# Patient Record
Sex: Male | Born: 1960
Health system: Southern US, Community
[De-identification: ages and names within clinical notes are randomized; demographics above are authoritative.]

## PROBLEM LIST (undated history)

## (undated) DIAGNOSIS — I509 Heart failure, unspecified: Secondary | ICD-10-CM

## (undated) DIAGNOSIS — I2510 Atherosclerotic heart disease of native coronary artery without angina pectoris: Secondary | ICD-10-CM

## (undated) DIAGNOSIS — I059 Rheumatic mitral valve disease, unspecified: Secondary | ICD-10-CM

## (undated) DIAGNOSIS — D039 Melanoma in situ, unspecified: Secondary | ICD-10-CM

## (undated) DIAGNOSIS — Z95810 Presence of automatic (implantable) cardiac defibrillator: Secondary | ICD-10-CM

## (undated) DIAGNOSIS — E538 Deficiency of other specified B group vitamins: Secondary | ICD-10-CM

## (undated) DIAGNOSIS — G629 Polyneuropathy, unspecified: Secondary | ICD-10-CM

## (undated) DIAGNOSIS — I6523 Occlusion and stenosis of bilateral carotid arteries: Secondary | ICD-10-CM

## (undated) DIAGNOSIS — Z87891 Personal history of nicotine dependence: Secondary | ICD-10-CM

## (undated) DIAGNOSIS — I4891 Unspecified atrial fibrillation: Secondary | ICD-10-CM

## (undated) DIAGNOSIS — I513 Intracardiac thrombosis, not elsewhere classified: Secondary | ICD-10-CM

## (undated) DIAGNOSIS — K219 Gastro-esophageal reflux disease without esophagitis: Secondary | ICD-10-CM

## (undated) DIAGNOSIS — Z951 Presence of aortocoronary bypass graft: Secondary | ICD-10-CM

## (undated) DIAGNOSIS — I236 Thrombosis of atrium, auricular appendage, and ventricle as current complications following acute myocardial infarction: Secondary | ICD-10-CM

## (undated) DIAGNOSIS — I255 Ischemic cardiomyopathy: Secondary | ICD-10-CM

## (undated) DIAGNOSIS — R011 Cardiac murmur, unspecified: Secondary | ICD-10-CM

## (undated) DIAGNOSIS — I5020 Unspecified systolic (congestive) heart failure: Secondary | ICD-10-CM

## (undated) DIAGNOSIS — I219 Acute myocardial infarction, unspecified: Secondary | ICD-10-CM

## (undated) DIAGNOSIS — Z7982 Long term (current) use of aspirin: Secondary | ICD-10-CM

## (undated) DIAGNOSIS — M160 Bilateral primary osteoarthritis of hip: Secondary | ICD-10-CM

## (undated) DIAGNOSIS — Z9889 Other specified postprocedural states: Secondary | ICD-10-CM

## (undated) DIAGNOSIS — E785 Hyperlipidemia, unspecified: Secondary | ICD-10-CM

## (undated) DIAGNOSIS — C801 Malignant (primary) neoplasm, unspecified: Secondary | ICD-10-CM

## (undated) DIAGNOSIS — I10 Essential (primary) hypertension: Secondary | ICD-10-CM

## (undated) DIAGNOSIS — M1990 Unspecified osteoarthritis, unspecified site: Secondary | ICD-10-CM

## (undated) HISTORY — PX: KNEE SURGERY: SHX244

## (undated) HISTORY — DX: Thrombosis of atrium, auricular appendage, and ventricle as current complications following acute myocardial infarction: I23.6

## (undated) HISTORY — DX: Ischemic cardiomyopathy: I25.5

## (undated) HISTORY — PX: SHOULDER ARTHROSCOPY: SHX128

## (undated) HISTORY — PX: CARDIAC VALVE REPLACEMENT: SHX585

## (undated) HISTORY — DX: Rheumatic mitral valve disease, unspecified: I05.9

## (undated) HISTORY — DX: Presence of automatic (implantable) cardiac defibrillator: Z95.810

## (undated) HISTORY — DX: Hyperlipidemia, unspecified: E78.5

## (undated) HISTORY — DX: Atherosclerotic heart disease of native coronary artery without angina pectoris: I25.10

## (undated) HISTORY — DX: Unspecified atrial fibrillation: I48.91

---

## 2008-10-22 HISTORY — PX: MITRAL VALVE REPAIR: SHX2039

## 2008-10-22 HISTORY — PX: CORONARY ARTERY BYPASS GRAFT: SHX141

## 2009-04-28 ENCOUNTER — Emergency Department: Payer: Self-pay | Admitting: Emergency Medicine

## 2009-04-29 ENCOUNTER — Encounter: Payer: Self-pay | Admitting: Cardiology

## 2009-04-29 ENCOUNTER — Ambulatory Visit: Payer: Self-pay | Admitting: Cardiology

## 2009-04-29 ENCOUNTER — Encounter (INDEPENDENT_AMBULATORY_CARE_PROVIDER_SITE_OTHER): Payer: Self-pay | Admitting: *Deleted

## 2009-04-29 DIAGNOSIS — I10 Essential (primary) hypertension: Secondary | ICD-10-CM | POA: Insufficient documentation

## 2009-04-29 DIAGNOSIS — E785 Hyperlipidemia, unspecified: Secondary | ICD-10-CM | POA: Insufficient documentation

## 2009-04-29 DIAGNOSIS — R011 Cardiac murmur, unspecified: Secondary | ICD-10-CM

## 2009-05-02 ENCOUNTER — Ambulatory Visit: Payer: Self-pay | Admitting: Surgery

## 2009-05-02 ENCOUNTER — Ambulatory Visit: Payer: Self-pay | Admitting: Cardiology

## 2009-05-02 ENCOUNTER — Inpatient Hospital Stay (HOSPITAL_COMMUNITY): Admission: AD | Admit: 2009-05-02 | Discharge: 2009-05-09 | Payer: Self-pay | Admitting: Cardiology

## 2009-05-02 ENCOUNTER — Encounter: Payer: Self-pay | Admitting: Cardiology

## 2009-05-02 HISTORY — PX: LEFT HEART CATH AND CORONARY ANGIOGRAPHY: CATH118249

## 2009-05-02 LAB — CONVERTED CEMR LAB
ALT: 18 units/L (ref 0–53)
AST: 17 units/L (ref 0–37)
Alkaline Phosphatase: 62 units/L (ref 39–117)
BUN: 12 mg/dL (ref 6–23)
CO2: 20 meq/L (ref 19–32)
Calcium: 9.7 mg/dL (ref 8.4–10.5)
Chloride: 105 meq/L (ref 96–112)
Creatinine, Ser: 0.88 mg/dL (ref 0.40–1.50)
HCT: 47.2 % (ref 39.0–52.0)
INR: 1 (ref 0.0–1.5)
MCHC: 34.1 g/dL (ref 30.0–36.0)
Platelets: 290 10*3/uL (ref 150–400)
RDW: 13.8 % (ref 11.5–15.5)
Sodium: 140 meq/L (ref 135–145)
Total Bilirubin: 0.4 mg/dL (ref 0.3–1.2)
Total CHOL/HDL Ratio: 6.5
Triglycerides: 302 mg/dL — ABNORMAL HIGH (ref <150)
WBC: 9.5 10*3/uL (ref 4.0–10.5)

## 2009-05-05 ENCOUNTER — Encounter: Payer: Self-pay | Admitting: Cardiology

## 2009-05-05 DIAGNOSIS — Z9889 Other specified postprocedural states: Secondary | ICD-10-CM

## 2009-05-05 DIAGNOSIS — Z951 Presence of aortocoronary bypass graft: Secondary | ICD-10-CM

## 2009-05-05 HISTORY — DX: Presence of aortocoronary bypass graft: Z95.1

## 2009-05-05 HISTORY — DX: Other specified postprocedural states: Z98.890

## 2009-05-05 HISTORY — PX: CORONARY ARTERY BYPASS GRAFT: SHX141

## 2009-05-05 HISTORY — PX: MITRAL VALVE REPAIR: SHX2039

## 2009-05-23 ENCOUNTER — Other Ambulatory Visit: Payer: Self-pay | Admitting: Cardiology

## 2009-05-23 ENCOUNTER — Ambulatory Visit: Payer: Self-pay | Admitting: Cardiology

## 2009-05-23 DIAGNOSIS — I25810 Atherosclerosis of coronary artery bypass graft(s) without angina pectoris: Secondary | ICD-10-CM

## 2009-05-23 DIAGNOSIS — I4891 Unspecified atrial fibrillation: Secondary | ICD-10-CM

## 2009-05-23 DIAGNOSIS — I429 Cardiomyopathy, unspecified: Secondary | ICD-10-CM | POA: Insufficient documentation

## 2009-05-23 DIAGNOSIS — I2589 Other forms of chronic ischemic heart disease: Secondary | ICD-10-CM

## 2009-05-26 ENCOUNTER — Other Ambulatory Visit: Payer: Self-pay | Admitting: Internal Medicine

## 2009-05-26 ENCOUNTER — Ambulatory Visit: Payer: Self-pay | Admitting: Internal Medicine

## 2009-05-26 ENCOUNTER — Encounter (HOSPITAL_COMMUNITY): Admission: RE | Admit: 2009-05-26 | Discharge: 2009-08-24 | Payer: Self-pay | Admitting: Cardiology

## 2009-05-31 ENCOUNTER — Ambulatory Visit: Payer: Self-pay | Admitting: Cardiology

## 2009-06-06 ENCOUNTER — Encounter: Payer: Self-pay | Admitting: *Deleted

## 2009-06-07 ENCOUNTER — Ambulatory Visit: Payer: Self-pay | Admitting: Surgery

## 2009-06-07 ENCOUNTER — Ambulatory Visit: Payer: Self-pay | Admitting: Cardiology

## 2009-06-07 ENCOUNTER — Encounter: Payer: Self-pay | Admitting: Internal Medicine

## 2009-06-07 ENCOUNTER — Encounter: Payer: Self-pay | Admitting: Cardiology

## 2009-06-07 ENCOUNTER — Ambulatory Visit (HOSPITAL_COMMUNITY): Admission: RE | Admit: 2009-06-07 | Discharge: 2009-06-07 | Payer: Self-pay | Admitting: Surgery

## 2009-06-07 LAB — CONVERTED CEMR LAB: POC INR: 3.1

## 2009-06-17 LAB — CONVERTED CEMR LAB
BUN: 20 mg/dL (ref 6–23)
CO2: 20 meq/L (ref 19–32)
Calcium: 9.5 mg/dL (ref 8.4–10.5)
Chloride: 106 meq/L (ref 96–112)
Potassium: 4.7 meq/L (ref 3.5–5.3)

## 2009-06-21 ENCOUNTER — Encounter: Payer: Self-pay | Admitting: Cardiology

## 2009-06-30 ENCOUNTER — Ambulatory Visit: Payer: Self-pay | Admitting: Internal Medicine

## 2009-07-14 ENCOUNTER — Ambulatory Visit: Payer: Self-pay | Admitting: Internal Medicine

## 2009-07-28 ENCOUNTER — Encounter: Payer: Self-pay | Admitting: Cardiology

## 2009-07-28 ENCOUNTER — Ambulatory Visit: Payer: Self-pay

## 2009-08-01 ENCOUNTER — Ambulatory Visit: Payer: Self-pay | Admitting: Cardiology

## 2009-08-01 ENCOUNTER — Telehealth: Payer: Self-pay | Admitting: Cardiology

## 2009-08-08 ENCOUNTER — Telehealth: Payer: Self-pay | Admitting: Cardiology

## 2009-08-10 ENCOUNTER — Telehealth: Payer: Self-pay | Admitting: Cardiology

## 2009-08-15 ENCOUNTER — Ambulatory Visit: Payer: Self-pay | Admitting: Internal Medicine

## 2009-08-16 ENCOUNTER — Ambulatory Visit: Payer: Self-pay | Admitting: Cardiology

## 2009-08-17 ENCOUNTER — Encounter: Payer: Self-pay | Admitting: Cardiology

## 2009-08-18 LAB — CONVERTED CEMR LAB
ALT: 31 units/L (ref 0–53)
AST: 17 units/L (ref 0–37)
Alkaline Phosphatase: 72 units/L (ref 39–117)
Cholesterol: 150 mg/dL (ref 0–200)
Glucose, Bld: 105 mg/dL — ABNORMAL HIGH (ref 70–99)
HDL: 40 mg/dL (ref >39)
LDL Cholesterol: 67 mg/dL (ref 0–99)
Potassium: 4.4 meq/L (ref 3.5–5.3)
Total CHOL/HDL Ratio: 3.8
Total Protein: 7 g/dL (ref 6.0–8.3)
Triglycerides: 217 mg/dL — ABNORMAL HIGH (ref <150)
VLDL: 43 mg/dL — ABNORMAL HIGH (ref 0–40)

## 2009-08-23 ENCOUNTER — Ambulatory Visit: Payer: Self-pay | Admitting: Cardiology

## 2009-08-24 ENCOUNTER — Telehealth: Payer: Self-pay | Admitting: Cardiology

## 2009-08-25 ENCOUNTER — Inpatient Hospital Stay (HOSPITAL_COMMUNITY): Admission: AD | Admit: 2009-08-25 | Discharge: 2009-08-26 | Payer: Self-pay | Admitting: Internal Medicine

## 2009-08-25 ENCOUNTER — Ambulatory Visit: Payer: Self-pay | Admitting: Internal Medicine

## 2009-08-25 DIAGNOSIS — Z95810 Presence of automatic (implantable) cardiac defibrillator: Secondary | ICD-10-CM

## 2009-08-25 HISTORY — DX: Presence of automatic (implantable) cardiac defibrillator: Z95.810

## 2009-08-25 HISTORY — PX: CARDIAC DEFIBRILLATOR PLACEMENT: SHX171

## 2009-08-25 LAB — CONVERTED CEMR LAB
CO2: 19 meq/L (ref 19–32)
Calcium: 9.6 mg/dL (ref 8.4–10.5)
Chloride: 106 meq/L (ref 96–112)
Glucose, Bld: 84 mg/dL (ref 70–99)
HCT: 45.2 % (ref 39.0–52.0)
Hemoglobin: 15.1 g/dL (ref 13.0–17.0)
MCV: 88.6 fL (ref 78.0–100.0)
Platelets: 254 10*3/uL (ref 150–400)
Prothrombin Time: 16.7 s — ABNORMAL HIGH (ref 11.6–15.2)
Sodium: 138 meq/L (ref 135–145)

## 2009-08-26 ENCOUNTER — Encounter: Payer: Self-pay | Admitting: Internal Medicine

## 2009-09-01 ENCOUNTER — Encounter (INDEPENDENT_AMBULATORY_CARE_PROVIDER_SITE_OTHER): Payer: Self-pay | Admitting: *Deleted

## 2009-09-07 ENCOUNTER — Ambulatory Visit: Payer: Self-pay | Admitting: Internal Medicine

## 2009-09-07 ENCOUNTER — Ambulatory Visit: Payer: Self-pay

## 2009-09-13 ENCOUNTER — Telehealth: Payer: Self-pay | Admitting: Cardiology

## 2009-09-30 ENCOUNTER — Ambulatory Visit: Payer: Self-pay | Admitting: Cardiology

## 2009-10-12 ENCOUNTER — Ambulatory Visit: Payer: Self-pay | Admitting: Internal Medicine

## 2009-10-12 ENCOUNTER — Encounter: Payer: Self-pay | Admitting: Cardiology

## 2009-10-17 LAB — CONVERTED CEMR LAB: Glucose, Bld: 87 mg/dL (ref 70–99)

## 2009-10-24 ENCOUNTER — Ambulatory Visit: Payer: Self-pay | Admitting: Cardiovascular Disease

## 2009-11-17 ENCOUNTER — Telehealth: Payer: Self-pay | Admitting: Cardiology

## 2009-11-21 ENCOUNTER — Ambulatory Visit: Payer: Self-pay | Admitting: Internal Medicine

## 2009-11-30 ENCOUNTER — Ambulatory Visit: Payer: Self-pay | Admitting: Cardiology

## 2009-12-29 ENCOUNTER — Ambulatory Visit: Payer: Self-pay | Admitting: Cardiovascular Disease

## 2010-01-31 ENCOUNTER — Ambulatory Visit: Payer: Self-pay | Admitting: Internal Medicine

## 2010-02-09 ENCOUNTER — Ambulatory Visit: Payer: Self-pay | Admitting: Cardiology

## 2010-02-13 ENCOUNTER — Encounter: Payer: Self-pay | Admitting: Internal Medicine

## 2010-02-13 ENCOUNTER — Ambulatory Visit: Payer: Self-pay | Admitting: Internal Medicine

## 2010-02-13 LAB — CONVERTED CEMR LAB: POC INR: 2.5

## 2010-02-16 LAB — CONVERTED CEMR LAB
CO2: 20 meq/L (ref 19–32)
Cholesterol: 143 mg/dL (ref 0–200)
Creatinine, Ser: 0.87 mg/dL (ref 0.40–1.50)
Glucose, Bld: 93 mg/dL (ref 70–99)
LDL Cholesterol: 71 mg/dL (ref 0–99)
Potassium: 4.5 meq/L (ref 3.5–5.3)
Sodium: 141 meq/L (ref 135–145)
Total CHOL/HDL Ratio: 4
Triglycerides: 181 mg/dL — ABNORMAL HIGH (ref <150)

## 2010-02-24 ENCOUNTER — Telehealth: Payer: Self-pay | Admitting: Cardiology

## 2010-02-28 ENCOUNTER — Ambulatory Visit: Payer: Self-pay | Admitting: Cardiovascular Disease

## 2010-03-21 ENCOUNTER — Ambulatory Visit: Payer: Self-pay | Admitting: Cardiovascular Disease

## 2010-03-21 LAB — CONVERTED CEMR LAB: POC INR: 3.1

## 2010-03-22 ENCOUNTER — Telehealth: Payer: Self-pay | Admitting: Cardiovascular Disease

## 2010-03-28 ENCOUNTER — Ambulatory Visit: Payer: Self-pay

## 2010-03-28 ENCOUNTER — Ambulatory Visit: Payer: Self-pay | Admitting: Surgery

## 2010-03-28 ENCOUNTER — Encounter: Payer: Self-pay | Admitting: Cardiology

## 2010-03-30 ENCOUNTER — Telehealth: Payer: Self-pay | Admitting: Cardiology

## 2010-04-14 ENCOUNTER — Ambulatory Visit: Payer: Self-pay | Admitting: Orthopedic Surgery

## 2010-04-17 ENCOUNTER — Telehealth: Payer: Self-pay | Admitting: Cardiology

## 2010-04-17 ENCOUNTER — Encounter: Admission: RE | Admit: 2010-04-17 | Discharge: 2010-04-17 | Payer: Self-pay | Admitting: Orthopedic Surgery

## 2010-04-18 ENCOUNTER — Ambulatory Visit: Payer: Self-pay | Admitting: Cardiology

## 2010-04-18 LAB — CONVERTED CEMR LAB: POC INR: 1.9

## 2010-04-19 ENCOUNTER — Encounter: Payer: Self-pay | Admitting: Cardiovascular Disease

## 2010-04-20 ENCOUNTER — Ambulatory Visit: Payer: Self-pay | Admitting: Orthopedic Surgery

## 2010-05-24 ENCOUNTER — Ambulatory Visit: Payer: Self-pay | Admitting: Cardiovascular Disease

## 2010-05-24 LAB — CONVERTED CEMR LAB: POC INR: 2.2

## 2010-06-21 ENCOUNTER — Ambulatory Visit: Payer: Self-pay | Admitting: Cardiology

## 2010-06-21 LAB — CONVERTED CEMR LAB: POC INR: 3.1

## 2010-07-07 ENCOUNTER — Ambulatory Visit: Payer: Self-pay | Admitting: Internal Medicine

## 2010-07-07 ENCOUNTER — Encounter: Payer: Self-pay | Admitting: Internal Medicine

## 2010-07-19 ENCOUNTER — Ambulatory Visit: Payer: Self-pay | Admitting: Cardiology

## 2010-07-19 LAB — CONVERTED CEMR LAB: POC INR: 2.8

## 2010-08-16 ENCOUNTER — Ambulatory Visit: Payer: Self-pay | Admitting: Cardiovascular Disease

## 2010-08-16 LAB — CONVERTED CEMR LAB: POC INR: 2.8

## 2010-08-23 ENCOUNTER — Telehealth: Payer: Self-pay | Admitting: Cardiology

## 2010-08-29 ENCOUNTER — Ambulatory Visit: Payer: Self-pay | Admitting: Cardiology

## 2010-09-27 ENCOUNTER — Ambulatory Visit: Payer: Self-pay | Admitting: Cardiovascular Disease

## 2010-09-29 ENCOUNTER — Ambulatory Visit: Payer: Self-pay

## 2010-10-06 ENCOUNTER — Telehealth (INDEPENDENT_AMBULATORY_CARE_PROVIDER_SITE_OTHER): Payer: Self-pay | Admitting: *Deleted

## 2010-10-11 ENCOUNTER — Ambulatory Visit: Payer: Self-pay | Admitting: Cardiovascular Disease

## 2010-10-11 LAB — CONVERTED CEMR LAB: POC INR: 2.5

## 2010-10-25 ENCOUNTER — Ambulatory Visit
Admission: RE | Admit: 2010-10-25 | Discharge: 2010-10-25 | Payer: Self-pay | Source: Home / Self Care | Attending: Internal Medicine | Admitting: Internal Medicine

## 2010-11-13 ENCOUNTER — Ambulatory Visit: Admission: RE | Admit: 2010-11-13 | Discharge: 2010-11-13 | Payer: Self-pay | Source: Home / Self Care

## 2010-11-13 ENCOUNTER — Encounter: Payer: Self-pay | Admitting: Surgery

## 2010-11-19 LAB — CONVERTED CEMR LAB
AST: 22 units/L (ref 0–37)
Bilirubin, Direct: 0.1 mg/dL (ref 0.0–0.3)
Cholesterol: 159 mg/dL (ref 0–200)
HDL: 34 mg/dL — ABNORMAL LOW (ref >39)
Indirect Bilirubin: 0.5 mg/dL (ref 0.0–0.9)
LDL Cholesterol: 83 mg/dL (ref 0–99)
Total Bilirubin: 0.6 mg/dL (ref 0.3–1.2)
Total CHOL/HDL Ratio: 4.7
Total Protein: 7.5 g/dL (ref 6.0–8.3)
VLDL: 42 mg/dL — ABNORMAL HIGH (ref 0–40)

## 2010-11-21 NOTE — Medication Information (Signed)
Summary: CCR/AMD  Anticoagulant Therapy  Managed by: Cloyde Reams, RN, BSN Referring MD: Dr Shirlee Latch PCP: [name], MD Supervising MD: Mariah Milling Indication 1: Mitral Valve Disorder (ICD-424.0) Lab Used: [facility]  Site:  INR POC 1.9 INR RANGE 2.0-3.0  Dietary changes: no    Health status changes: no    Bleeding/hemorrhagic complications: no    Recent/future hospitalizations: yes       Details: Shoulder arthogram scheduled for Thurs 04/20/10  Any changes in medication regimen? no    Recent/future dental: no  Any missed doses?: no       Is patient compliant with meds? yes       Allergies: No Known Drug Allergies  Anticoagulation Management History:      The patient is taking warfarin and comes in today for a routine follow up visit.  Negative risk factors for bleeding include an age less than 69 years old.  The bleeding index is 'low risk'.  Positive CHADS2 values include History of HTN.  Negative CHADS2 values include Age > 37 years old.  The start date was 05/05/2009.  His last INR was 1.36.  Anticoagulation responsible provider: [facility].  INR POC: 1.9.  Cuvette Lot#: [id number].  Exp: 06/2011.    Anticoagulation Management Assessment/Plan:      The patient's current anticoagulation dose is Warfarin sodium 5 mg tabs: Use as directed by Anticoagulation Clinic.  The target INR is 2.5 - 3.  The next INR is due 05/16/2010.  Anticoagulation instructions were given to patient.  Results were reviewed/authorized by Cloyde Reams, RN, BSN.  He was notified by Cloyde Reams RN.         Prior Anticoagulation Instructions: INR 3.1  Start taking 1/2 tablet daily except 1 tablets on Mondays, Wednesdays, and Fridays.  Recheck in 4 weeks.    Current Anticoagulation Instructions: INR 1.9  Take an extra 1/2 tablet today, then resume same dosage 1/2 tablet daily except 1 tablet Mondays, Wednesdays, and Fridays.  Recheck in 4 weeks.

## 2010-11-21 NOTE — Assessment & Plan Note (Signed)
Summary: F4M/AMD   Visit Type:  Follow-up Referring Provider:  Marca Ancona, MD Primary Provider:  [name], MD  CC:  Patient has no complaints. Folow up only.  History of Present Illness: 50 yo with h/o CAD s/p CABG and ischemic cardiomyopathy as well as LV apical thrombus returns for followup.  He is doing quite well with minimal symptoms.  No chest pain.  He completed cardiac rehab and has been trying to keep up with the exercise regimen that he learned.  No exertional dyspnea.  He push-mowed his lawn this week with no problems.  [place]  He is tolerating all his meds.  Weight is stable.   ECG: NSR, old inferior and anterior MIs  Labs (12/10): creatinine 0.85  Current Medications (verified): 1)  Lipitor 80 Mg Tabs (Atorvastatin Calcium) .... Take One Tablet By Mouth Daily. 2)  Nitroglycerin 0.4 Mg Subl (Nitroglycerin) .... One Tablet Under Tongue Every 5 Minutes As Needed For Chest Pain---May Repeat Times Three 3)  Warfarin Sodium 5 Mg Tabs (Warfarin Sodium) .... Use As Directed By Anticoagulation Clinic 4)  Carvedilol 12.5 Mg Tabs (Carvedilol) .... Take One Tablet By Mouth Twice A Day 5)  Enalapril Maleate 10 Mg Tabs (Enalapril Maleate) .... Take One Tablet By Mouth Twice A Day 6)  Spironolactone 25 Mg Tabs (Spironolactone) .... Take 1/2 Tab By Mouth Daily 7)  Aspirin 81 Mg Tbec (Aspirin) .... Take One Tablet By Mouth Daily  Allergies (verified): No Known Drug Allergies  Past History:  Past Medical History: Reviewed history from 09/30/2009 and no changes required. 1.  Hyperlipidemia 2.  Smoking: quit 7/10 3.  CAD:  LHC (7/10) with EF 35%, RCA totally occluded, ostial LAD totally occluded, mid CFX totally occluded.  Large OM1 with collaterals to the LAD and RCA.  CABG with LIMA-LAD, RIMA-RCA.   4.  Moderate MR: MV repair/annuloplasty ring at time of CABG (7/10). 5.  Ischemic CMP: echo (7/10) with mild LV dilatation, EF  25-30%.  Anteroseptal, inferoseptal, inferior, and apical akinesis.  RV normal.  Moderate MR.  Repeat echo (10/10) EF 20%, mild to moderately dilated, dyskinetic apex, akinetic septum, severe hypokinesis of the inferior and anterior walls. Mild MR.  LV apical thrombus.  6.  Atrial fibrillation: post-op CABG.  7.  LV apical thrombus: on coumadin.  8.  [employer] dual chamber AICD (11/10)  Family History: Reviewed history from 04/29/2009 and no changes required. Father with MI at 25 Mother with CHF Hyperlipidemia runs in family  Social History: Lives with wife in [place].  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  Originally from [place].  Smoked 1 ppd x years, quit 7/10.  Occasional ETOH.  No drugs.   Review of Systems       All systems reviewed and negative except per HPI.   Vital Signs:  Patient profile:   50 year old male Height:      69 inches Weight:      206.50 pounds BMI:     30.60 Pulse rate:   69 / minute BP sitting:   136 / 84  (left arm) Cuff size:   large  Physical Exam  General:  Well developed, well nourished, in no acute distress. Neck:  Neck supple, no JVD. No masses, thyromegaly or abnormal cervical nodes. Lungs:  Clear bilaterally to auscultation and percussion. Heart:  Non-displaced PMI, chest non-tender; regular rate and rhythm, S1,  S2 without rubs or gallops.  No murmur.  Carotid upstroke normal, no bruit. Pedals normal pulses. No edema, no varicosities. Abdomen:  Bowel sounds positive; abdomen soft and non-tender without masses, organomegaly, or hernias noted. No hepatosplenomegaly. Extremities:  No clubbing or cyanosis. Neurologic:  Alert and oriented x 3. Psych:  Normal affect.    ICD Specifications Following MD:  [name] Manges, MD     ICD Vendor:  [facility] [employer]     ICD Model Number:  E110     ICD Serial Number:  [id number] ICD DOI:  08/25/2009     ICD Implanting MD:  [name] Manges, MD  Lead 1:    Location:  RA     DOI: 08/25/2009     Model #: [id number]     Serial #: [id number]     Status: active Lead 2:    Location: RV     DOI: 08/25/2009     Model #: [id number]     Serial #: [id number]     Status: active  Episodes Coumadin:  Yes  Brady Parameters Mode DDD     Lower Rate Limit:  40     Upper Rate Limit 130 PAV 220     Sensed AV Delay:  300  Tachy Zones VF:  200     VT:  170     Impression & Recommendations:  Problem # 1:  CARDIOMYOPATHY, ISCHEMIC (ICD-414.8) NYHA class I symptoms at this time.  He is doing very well and is active.  I will titrate up his Coreg to 18.75 mg two times a day today and spironolactone to 25 mg daily today as well.  BMET in 1 week. Continue current dose of enalapril.   Problem # 2:  CAD, AUTOLOGOUS BYPASS GRAFT (ICD-414.02) No ischemia symptoms.  Will draw lipids/LFTs when he returns for BMET in 1 week.  Goal LDL < 70.   Problem # 3:  IMPLANTABLE DEFIBRILLATOR BS MADIR-RIT (ICD-V45.02) Has [employer] ICD, followup with Dr. Graciela Husbands.

## 2010-11-21 NOTE — Progress Notes (Signed)
Summary: surgical clearance  Phone Note Call from Patient   Summary of Call: pt. to have shoulder surgery on Thursday and they have not received a clearance in same day surg at [facility].  It has been faxed before to the [facility] office.  Please fax to them the clearance. Initial call taken by: Park Breed,  April 17, 2010 3:52 PM  Follow-up for Phone Call        Talked to Dr. Shirlee Latch ok for surgery clearance faxed to Dr. Rosita Kea. Benedict Needy, RN  April 18, 2010 9:46 AM

## 2010-11-21 NOTE — Assessment & Plan Note (Signed)
Summary: rov   Visit Type:  Follow-up Referring Provider:  Marca Ancona, MD Primary Provider:  [name], MD  CC:  "Doing well"..  History of Present Illness: 50 yo with h/o CAD s/p CABG and ischemic cardiomyopathy as well as LV apical thrombus returns for followup.  He is doing quite well with minimal symptoms.  No chest pain.  He completed cardiac rehab and has been trying to keep up with the exercise regimen that he learned.  No exertional dyspnea.   He is tolerating all his meds.  Weight is down 2 lbs.  [place] found property.  Echo was reviewed from 6/11, showing EF 40% (improved) with dyskinetic apex.    ECG: NSR, old inferior and anterior MIs with inferior and anterolateral T wave inversions  Labs (12/10): creatinine 0.85 Labs (4/11): K 4.5, creatinine 0.87, LDL 71, HDL 36  Current Medications (verified): 1)  Lipitor 80 Mg Tabs (Atorvastatin Calcium) .... Take One Tablet By Mouth Daily. 2)  Nitroglycerin 0.4 Mg Subl (Nitroglycerin) .... One Tablet Under Tongue Every 5 Minutes As Needed For Chest Pain---May Repeat Times Three 3)  Warfarin Sodium 5 Mg Tabs (Warfarin Sodium) .... Use As Directed By Anticoagulation Clinic 4)  Carvedilol 12.5 Mg Tabs (Carvedilol) .Marland Kitchen.. 1 1/2 By Mouth Two Times A Day 5)  Enalapril Maleate 10 Mg Tabs (Enalapril Maleate) .... Take One Tablet By Mouth Twice A Day 6)  Spironolactone 25 Mg Tabs (Spironolactone) .... One By Mouth Daily 7)  Aspirin 81 Mg Tbec (Aspirin) .... Take One Tablet By Mouth Daily 8)  Fish Oil 1200 Mg Caps (Omega-3 Fatty Acids) .Marland Kitchen.. 1 Tab Daily  Allergies (verified): No Known Drug Allergies  Past History:  Past Surgical History: Last updated: 08/15/2009 Knee surgery x 4 CAABG  Family History: Last updated: 04/29/2009 Father with MI at 58 Mother with CHF Hyperlipidemia runs in family  Social History: Last updated: 02/09/2010 Lives with wife in [place].  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  Originally from [place].  Smoked 1 ppd x years, quit 7/10.  Occasional ETOH.  No drugs.   Past Medical History: 1.  Hyperlipidemia 2.  Smoking: quit 7/10 3.  CAD:  LHC (7/10) with EF 35%, RCA totally occluded, ostial LAD totally occluded, mid CFX totally occluded.  Large OM1 with collaterals to the LAD and RCA.  CABG with LIMA-LAD, RIMA-RCA.   4.  Moderate MR: MV repair/annuloplasty ring at time of CABG (7/10). 5.  Ischemic CMP: echo (7/10) with mild LV dilatation, EF 25-30%.  Anteroseptal, inferoseptal, inferior, and apical akinesis.  RV normal.  Moderate MR.  Repeat echo (10/10) EF 20%, mild to moderately dilated, dyskinetic apex, akinetic septum, severe hypokinesis of the inferior and anterior walls. Mild MR.  LV apical thrombus.  Repeat echo (6/11): EF 40%, moderately dilated LV, mild LVH, apical anterior wall/apical septal wall/apex dyskinetic, s/p MV repair with no MS or MR. LV apical thrombus not seen on this echo.  6.  Atrial fibrillation: post-op CABG.  7.  LV apical thrombus: on coumadin.  8.  [employer] dual chamber AICD (11/10)  Family History: Reviewed history from 04/29/2009 and no changes required. Father with MI at 73 Mother with CHF Hyperlipidemia runs in family  Social History: Reviewed history from 02/09/2010 and no changes required. Lives with wife in [place].  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  Originally from [place].  Smoked 1 ppd x years, quit 7/10.  Occasional ETOH.  No drugs.   Review of Systems       All systems reviewed and negative except as per HPI.    Vital Signs:  Patient profile:   50 year old male Height:      69 inches Weight:      204 pounds BMI:     30.23 Pulse rate:   74 / minute BP sitting:   110 / 70  (left arm) Cuff size:   regular  Vitals Entered By: Bishop Dublin, CMA (August 29, 2010 11:28 AM)  Physical  Exam  General:  Well developed, well nourished, in no acute distress. Neck:  Neck supple, no JVD. No masses, thyromegaly or abnormal cervical nodes. Lungs:  Clear bilaterally to auscultation and percussion. Heart:  Non-displaced PMI, chest non-tender; regular rate and rhythm, S1, S2 without rubs or gallops.  No murmur.  Carotid upstroke normal, no bruit. Pedals normal pulses. No edema, no varicosities. Abdomen:  Bowel sounds positive; abdomen soft and non-tender without masses, organomegaly, or hernias noted. No hepatosplenomegaly. Extremities:  No clubbing or cyanosis. Neurologic:  Alert and oriented x 3. Psych:  Normal affect.    ICD Specifications Following MD:  Sherryl Manges, MD     ICD Vendor:  [facility] [employer]     ICD Model Number:  E110     ICD Serial Number:  [id number] ICD DOI:  08/25/2009     ICD Implanting MD:  Sherryl Manges, MD Research Study Name MADIT RIT  Lead 1:    Location: RA     DOI: 08/25/2009     Model #: [id number]     Serial #: [id number]     Status: active Lead 2:    Location: RV     DOI: 08/25/2009     Model #: [id number]     Serial #: [id number]     Status: active  ICD Follow Up ICD Dependent:  No      Episodes Coumadin:  Yes  Brady Parameters Mode DDD     Lower Rate Limit:  40     Upper Rate Limit 130 PAV 220     Sensed AV Delay:  300  Tachy Zones VF:  200     VT:  170     Impression & Recommendations:  Problem # 1:  CAD, AUTOLOGOUS BYPASS GRAFT (ICD-414.02) No ischemia symptoms.  Will draw lipids/LFTs,  goal LDL < 70.  Continue ASA, statin, ACEI, beta blocker.   Problem # 2:  CARDIOMYOPATHY, ISCHEMIC (ICD-414.8) NYHA class I symptoms at this time.  He is doing very well and is active.  I will titrate up his Coreg to 25 mg two times a day today. Continue current doses of enalapril and spironolactone.  EF improved to 40% on last echo.   Problem # 3:  IMPLANTABLE DEFIBRILLATOR BS MADIR-RIT (ICD-V45.02) Has [employer] ICD, followup with Dr. Graciela Husbands.   Problem # 4:   APICAL THROMBUS Not seen on last echo.  Continue coumadin for now.   Other Orders: EKG w/ Interpretation (93000) T-Hepatic Function [phone]) T-Lipid Profile ([id number])  Patient Instructions: 1)  Your physician recommends that you schedule a follow-up appointment in: 6 months 2)  Your physician has recommended you make the following change in your medication: Increase Coreg to 25mg  two times a day. 3)  Your physician recommends that you have labwork today Lipid/Liver. Prescriptions: CARVEDILOL 25 MG TABS (CARVEDILOL)  Take one tablet by mouth twice a day  #60 x 6   Entered by:   Cloyde Reams RN   Authorized by:   Marca Ancona, MD   Signed by:   Cloyde Reams RN on 08/29/2010   Method used:   Electronically to        [facility] [phone]* (retail)       [address]       [place], [place]  [address]       Ph: [phone]       Fax: [phone]   RxID:   [id number] SPIRONOLACTONE 25 MG TABS (SPIRONOLACTONE) Take 1 tablet by mouth two times a day  #60 x 2   Entered by:   Cloyde Reams RN   Authorized by:   Marca Ancona, MD   Signed by:   Cloyde Reams RN on 08/29/2010   Method used:   Electronically to        [facility] [phone]* (retail)       [address]       [place], [place]  [address]       Ph: [phone]       Fax: [phone]   RxID:   [id number] ENALAPRIL MALEATE 20 MG TABS (ENALAPRIL MALEATE) Take 1 tablet by mouth two times a day  #60 x 2   Entered by:   Cloyde Reams RN   Authorized by:   Marca Ancona, MD   Signed by:   Cloyde Reams RN on 08/29/2010   Method used:   Electronically to        [facility] [phone]* (retail)       [address]       [place], [place]  [address]       Ph: [phone]       Fax: [phone]   RxID:   [id number]

## 2010-11-21 NOTE — Progress Notes (Signed)
Summary: PROCEDURE  Phone Note From Other Clinic Call back at [phone] EXT [phone]   Caller: SHEILA FROM DR [facility]  Call For: [facility] Summary of Call: DOES PT NEED TO STOP COUMADIN FOR 5 DAYS BEFORE AN ARTHROGRAM OF THE SHOULDER?  IF SO, IS IT OKAY FOR HIM TO STOP? #[phone] EXT [phone] SHEILA AT [facility] Initial call taken by: Harlon Flor,  March 30, 2010 11:38 AM  Follow-up for Phone Call        Jackelyn Knife at Dr. Neomia Glass office wants pt to stop coumadin for 5 days please advise if this is okay. Thanks Benedict Needy, RN  March 30, 2010 2:05 PM      Appended Document: PROCEDURE Will need to ask orthopedic surgeon if he needs to stop coumadin for the arthrogram.  I suspect that he will.  If he needs to stop coumadin, he will need a repeat limited echo with Definity contrast to make sure that no apical thrombus is present.  If no thrombus noted, ok to stop coumadin for procedure 5 days before and start back afterwards.   Appended Document: PROCEDURE Called spoke with Silvio Pate at Dr Rosita Kea office, advised of need to have 2D ECHO with Definity contrast prior to stopping coumadin to evaluate for apical thrombus.  Silvio Pate is going to check with Dr Rosita Kea and she will call us back if wishes to proceed and we will schedule the ECHO. EWJ

## 2010-11-21 NOTE — Progress Notes (Signed)
Summary: Refill  Phone Note Refill Request Message from:  Patient on November 17, 2009 10:13 AM  Refills Requested: Medication #1:  WARFARIN SODIUM 5 MG TABS Use as directed by Anticoagulation Clinic [facility]   Initial call taken by: [place] Car[place],  November 17, 2009 10:13 AM    Prescriptions: WARFARIN SODIUM 5 MG TABS (WARFARIN SODIUM) Use as directed by Anticoagulation Clinic  #60 x 2   Entered by:   Charlena Cross, RN, BSN   Authorized by:   Marca Ancona, MD   Signed by:   Charlena Cross, RN, BSN on 11/17/2009   Method used:   Electronically to        [facility] [phone]* (retail)       [address]       [place], [place]  [address]       Ph: [phone]       Fax: [phone]   RxID:   [id number]

## 2010-11-21 NOTE — Progress Notes (Signed)
Summary: disability form  Phone Note Outgoing Call   Call placed by: Katina Dung, RN, BSN,  August 23, 2010 12:50 PM Call placed to: Patient Summary of Call: disability papers  Follow-up for Phone Call        Dr Shirlee Latch received long term disability form  from [employer] for Dr Shirlee Latch to complete.--I talked with pt about the status of his disability. I  made pt an appointment to see Dr Shirlee Latch 08/29/10 in [place] to evaluate his medical condition and discuss the status of his disability. --I have sent the incomplete  disablity form  by courier to the [facility]  in a red folder.

## 2010-11-21 NOTE — Assessment & Plan Note (Signed)
Summary: ROV/CCR/AMD   Visit Type:  Follow-up Referring Provider:  Marca Ancona, MD Primary Provider:  [name], MD  CC:  no complaints.  History of Present Illness:    [name] [name] is seen in followup for an ICD implanted for primary prevention in the setting of ischemic heart disease prior bypass surgery and ejection fraction of 20-25%. He also has an apical thrombus for which he takes Coumadin.  He is enrolled in the MADIT-R IT trial. He's had no problems with his defibrillator since implantation. The patient denies SOB, chest pain, edema or palpitations   Current Problems (verified): 1)  Atrial Fibrillation  (ICD-427.31) 2)  Cad, Autologous Bypass Graft  (ICD-414.02) 3)  Cardiomyopathy, Ischemic  (ICD-414.8) 4)  Hypertension, Unspecified  (ICD-401.9) 5)  Murmur  (ICD-785.2) 6)  Hyperlipidemia-mixed  (ICD-272.4) 7)  Chest Pain-precordial  (NUU-725.36)  Current Medications (verified): 1)  Lipitor 80 Mg Tabs (Atorvastatin Calcium) .... Take One Tablet By Mouth Daily. 2)  Nitroglycerin 0.4 Mg Subl (Nitroglycerin) .... One Tablet Under Tongue Every 5 Minutes As Needed For Chest Pain---May Repeat Times Three 3)  Warfarin Sodium 5 Mg Tabs (Warfarin Sodium) .... Use As Directed By Anticoagulation Clinic 4)  Carvedilol 12.5 Mg Tabs (Carvedilol) .... Take One Tablet By Mouth Twice A Day 5)  Enalapril Maleate 10 Mg Tabs (Enalapril Maleate) .... Take One Tablet By Mouth Twice A Day 6)  Spironolactone 25 Mg Tabs (Spironolactone) .... Take 1/2 Tab By Mouth Daily 7)  Aspirin 81 Mg Tbec (Aspirin) .... Take One Tablet By Mouth Daily  Allergies (verified): No Known Drug Allergies  Past History:  Past Medical History: Last updated: 09/30/2009 1.  Hyperlipidemia 2.  Smoking: quit 7/10 3.  CAD:  LHC (7/10) with EF 35%, RCA totally occluded, ostial LAD totally occluded, mid CFX totally occluded.  Large OM1 with collaterals to the LAD and RCA.  CABG with LIMA-LAD, RIMA-RCA.   4.   Moderate MR: MV repair/annuloplasty ring at time of CABG (7/10). 5.  Ischemic CMP: echo (7/10) with mild LV dilatation, EF 25-30%.  Anteroseptal, inferoseptal, inferior, and apical akinesis.  RV normal.  Moderate MR.  Repeat echo (10/10) EF 20%, mild to moderately dilated, dyskinetic apex, akinetic septum, severe hypokinesis of the inferior and anterior walls. Mild MR.  LV apical thrombus.  6.  Atrial fibrillation: post-op CABG.  7.  LV apical thrombus: on coumadin.  8.  [employer] dual chamber AICD (11/10)  Past Surgical History: Last updated: 08/15/2009 Knee surgery x 4 CAABG  Family History: Last updated: 04/29/2009 Father with MI at 16 Mother with CHF Hyperlipidemia runs in family  Social History: Last updated: 05/23/2009 Lives with wife in [place].  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  Smoked 1 ppd x years, quit 7/10.  Occasional ETOH.  No drugs.   Vital Signs:  Patient profile:   50 year old male Height:      69 inches Weight:      202.75 pounds BMI:     30.05 Pulse rate:   76 / minute Pulse rhythm:   regular BP sitting:   120 / 72  (right arm) Cuff size:   regular  Vitals Entered By: Mercer Pod (November 21, 2009 10:57 AM)  Physical Exam  General:  The patient was alert and oriented in no acute distress. HEENT Normal.  Neck veins were flat, carotids were brisk.  Lungs were clear.  Heart sounds were regular without murmurs or gallops.  Abdomen was soft with  active bowel sounds. There is no clubbing cyanosis or edema. Skin Warm and dry Device pocket is well-healed    ICD Specifications Following MD:  Sherryl Manges, MD     ICD Vendor:  [facility] [employer]     ICD Model Number:  E110     ICD Serial Number:  [id number] ICD DOI:  08/25/2009     ICD Implanting MD:  Sherryl Manges, MD  Lead 1:    Location: RA     DOI: 08/25/2009     Model #: [id number]     Serial #: [id number]     Status: active Lead 2:    Location: RV     DOI: 08/25/2009     Model #:  [id number]     Serial #: [id number]     Status: active  ICD Follow Up Remote Check?  No Charge Time:  8.8 seconds     Battery Est. Longevity:  8.5 years   ICD Device Measurements Atrium:  Amplitude: 6.9 mV, Impedance: 499 ohms, Threshold: 0.6 V at 0.4 msec Right Ventricle:  Amplitude: 25 mV, Impedance: 530 ohms, Threshold: 0.4 V at 0.4 msec Shock Impedance: 56 ohms   Episodes MS Episodes:  0     Percent Mode Switch:  0     Coumadin:  Yes Shock:  0     ATP:  0      Brady Parameters Mode DDD     Lower Rate Limit:  40     Upper Rate Limit 130 PAV 220     Sensed AV Delay:  300  Tachy Zones VF:  200     VT:  170     Tech Comments:  Checked by Media planner for research. Altha Harm, LPN  November 21, 2009 11:35 AM   Impression & Recommendations:  Problem # 1:  ATRIAL FIBRILLATION (ICD-427.31) no evidence of atrial fibrillation His updated medication list for this problem includes:    Warfarin Sodium 5 Mg Tabs (Warfarin sodium) ..... Use as directed by anticoagulation clinic    Carvedilol 12.5 Mg Tabs (Carvedilol) .Marland Kitchen... Take one tablet by mouth twice a day    Aspirin 81 Mg Tbec (Aspirin) .Marland Kitchen... Take one tablet by mouth daily  Problem # 2:  CARDIOMYOPATHY, ISCHEMIC (ICD-414.8) stable on current medication His updated medication list for this problem includes:    Nitroglycerin 0.4 Mg Subl (Nitroglycerin) ..... One tablet under tongue every 5 minutes as needed for chest pain---may repeat times three    Warfarin Sodium 5 Mg Tabs (Warfarin sodium) ..... Use as directed by anticoagulation clinic    Carvedilol 12.5 Mg Tabs (Carvedilol) .Marland Kitchen... Take one tablet by mouth twice a day    Enalapril Maleate 10 Mg Tabs (Enalapril maleate) .Marland Kitchen... Take one tablet by mouth twice a day    Spironolactone 25 Mg Tabs (Spironolactone) .Marland Kitchen... Take 1/2 tab by mouth daily    Aspirin 81 Mg Tbec (Aspirin) .Marland Kitchen... Take one tablet by mouth daily  Problem # 3:  IMPLANTABLE DEFIBRILLATOR BS MADIR-RIT (ICD-V45.02) normal device  function

## 2010-11-21 NOTE — Medication Information (Signed)
Summary: CCR/AMD  Anticoagulant Therapy  Managed by: Robyn Haber, RN, BSN PCP: [name], MD Supervising MD: Mariah Milling Indication 1: Mitral Valve Disorder (ICD-424.0) Lab Used: [facility]  Site: [place] INR POC 2.5 INR RANGE 2.0-3.0  Dietary changes: no    Health status changes: no    Bleeding/hemorrhagic complications: no    Recent/future hospitalizations: no    Any changes in medication regimen? no    Recent/future dental: no  Any missed doses?: no       Is patient compliant with meds? yes       Allergies: No Known Drug Allergies  Anticoagulation Management History:      The patient is taking warfarin and comes in today for a routine follow up visit.  Negative risk factors for bleeding include an age less than 34 years old.  The bleeding index is 'low risk'.  Positive CHADS2 values include History of HTN.  Negative CHADS2 values include Age > 61 years old.  The start date was 05/05/2009.  His last INR was 1.36.  Anticoagulation responsible provider: Deonna Krummel.  INR POC: 2.5.    Anticoagulation Management Assessment/Plan:      The patient's current anticoagulation dose is Warfarin sodium 5 mg tabs: Use as directed by Anticoagulation Clinic.  The target INR is 2.5 - 3.  The next INR is due 12/28/2009.  Anticoagulation instructions were given to patient.  Results were reviewed/authorized by Robyn Haber, RN, BSN.  He was notified by Charlena Cross, RN, BSN.         Prior Anticoagulation Instructions: The patient is to continue with the same dose of coumadin.  This dosage includes: coumadin 2.5 mg daily with 5 mg on M W F   Current Anticoagulation Instructions: Same as Prior Instructions.

## 2010-11-21 NOTE — Medication Information (Signed)
Summary: ccr  Anticoagulant Therapy  Managed by: Robyn Haber, RN, BSN PCP: [name], MD Supervising MD: Excell Seltzer MD, Casimiro Needle Indication 1: Mitral Valve Disorder (ICD-424.0) Lab Used: [facility] Site: [place] INR POC 2.5  Dietary changes: no    Health status changes: no    Bleeding/hemorrhagic complications: no    Recent/future hospitalizations: no    Any changes in medication regimen? no    Recent/future dental: no  Any missed doses?: no       Is patient compliant with meds? yes       Allergies: No Known Drug Allergies  Anticoagulation Management History:      The patient is taking warfarin and comes in today for a routine follow up visit.  Negative risk factors for bleeding include an age less than 68 years old.  The bleeding index is 'low risk'.  Positive CHADS2 values include History of HTN.  Negative CHADS2 values include Age > 30 years old.  The start date was 05/05/2009.  His last INR was 1.36.  Anticoagulation responsible provider: Excell Seltzer MD, Casimiro Needle.  INR POC: 2.5.    Anticoagulation Management Assessment/Plan:      The patient's current anticoagulation dose is Warfarin sodium 5 mg tabs: Use as directed by Anticoagulation Clinic.  The target INR is 2.5 - 3.  The next INR is due 11/21/2009.  Anticoagulation instructions were given to patient.  Results were reviewed/authorized by Robyn Haber, RN, BSN.  He was notified by Charlena Cross, RN, BSN.         Prior Anticoagulation Instructions: coumadin 2.5 mg daily with 5 mg on M W F  Current Anticoagulation Instructions: The patient is to continue with the same dose of coumadin.  This dosage includes: coumadin 2.5 mg daily with 5 mg on M W F

## 2010-11-21 NOTE — Medication Information (Signed)
Summary: CCR/AMD  Anticoagulant Therapy  Managed by: Cloyde Reams, RN, BSN Referring MD: Dr Shirlee Latch PCP: [name], MD Supervising MD: Mariah Milling Indication 1: Mitral Valve Disorder (ICD-424.0) Lab Used: [facility] Site: [place] INR POC 3.1 INR RANGE 2.0-3.0    Bleeding/hemorrhagic complications: no    Recent/future hospitalizations: yes       Details: Possible shoulder surgery pending.  Pt will let us know.    Any changes in medication regimen? no     Any missed doses?: no       Is patient compliant with meds? yes       Allergies: No Known Drug Allergies   Anticoagulation Management History:      Negative risk factors for bleeding include an age less than 29 years old.  The bleeding index is 'low risk'.  Positive CHADS2 values include History of HTN.  Negative CHADS2 values include Age > 43 years old.  The start date was 05/05/2009.  His last INR was 1.36.  Anticoagulation responsible provider: Lucio Litsey.  INR POC: 3.1.  Exp: 02/2011.    Anticoagulation Management Assessment/Plan:      The patient's current anticoagulation dose is Warfarin sodium 5 mg tabs: Use as directed by Anticoagulation Clinic.  The target INR is 2.5 - 3.  The next INR is due 04/18/2010.  Anticoagulation instructions were given to patient.  Results were reviewed/authorized by Cloyde Reams, RN, BSN.  He was notified by Cloyde Reams RN.         Prior Anticoagulation Instructions: INR 1.7  Take an extra 1/2 tablet today, then start taking 1 tablet daily except 1/2 tablet on Tuesdays, Thursdays, and Saturdays.  Recheck in 3 weeks.    Current Anticoagulation Instructions: INR 3.1  Start taking 1/2 tablet daily except 1 tablets on Mondays, Wednesdays, and Fridays.  Recheck in 4 weeks.    Appended Document: CCR/AMD    Prescriptions: WARFARIN SODIUM 5 MG TABS (WARFARIN SODIUM) Use as directed by Anticoagulation Clinic  #90 x 1   Entered by:   Cloyde Reams  RN   Authorized by:   Marca Ancona, MD   Signed by:   Cloyde Reams RN on 03/21/2010   Method used:   [facility] [phone]* (retail)       [address]       [place], [place]  [address]       Ph: [phone]       Fax: [phone]   RxID:   [id number]

## 2010-11-21 NOTE — Procedures (Signed)
Summary: PACER/AMD   Current Medications (verified): 1)  Lipitor 80 Mg Tabs (Atorvastatin Calcium) .... Take One Tablet By Mouth Daily. 2)  Nitroglycerin 0.4 Mg Subl (Nitroglycerin) .... One Tablet Under Tongue Every 5 Minutes As Needed For Chest Pain---May Repeat Times Three 3)  Warfarin Sodium 5 Mg Tabs (Warfarin Sodium) .... Use As Directed By Anticoagulation Clinic 4)  Carvedilol 12.5 Mg Tabs (Carvedilol) .Marland Kitchen.. 1 1/2 By Mouth Two Times A Day 5)  Enalapril Maleate 10 Mg Tabs (Enalapril Maleate) .... Take One Tablet By Mouth Twice A Day 6)  Spironolactone 25 Mg Tabs (Spironolactone) .... One By Mouth Daily 7)  Aspirin 81 Mg Tbec (Aspirin) .... Take One Tablet By Mouth Daily 8)  Fish Oil 1200 Mg Caps (Omega-3 Fatty Acids) .Marland Kitchen.. 1 Tab Daily  Allergies (verified): No Known Drug Allergies    ICD Specifications Following MD:  Sherryl Manges, MD     ICD Vendor:  [facility] [employer]     ICD Model Number:  E110     ICD Serial Number:  [id number] ICD DOI:  08/25/2009     ICD Implanting MD:  Sherryl Manges, MD Research Study Name MADIT RIT  Lead 1:    Location: RA     DOI: 08/25/2009     Model #: [id number]     Serial #: [id number]     Status: active Lead 2:    Location: RV     DOI: 08/25/2009     Model #: [id number]     Serial #: [id number]     Status: active  ICD Follow Up Remote Check?  No Charge Time:  8.9 seconds     Battery Est. Longevity:  8.5 years Underlying rhythm:  SR ICD Dependent:  No       ICD Device Measurements Atrium:  Amplitude: 6.9 mV, Impedance: 471 ohms, Threshold: 0.6 V at 0.4 msec Right Ventricle:  Amplitude: 25 mV, Impedance: 539 ohms, Threshold: 0.7 V at 0.4 msec Shock Impedance: 56 ohms   Episodes MS Episodes:  2     Coumadin:  Yes Shock:  0     ATP:  0     Nonsustained:  3     Atrial Pacing:  <1%     Ventricular Pacing:  <1%  Brady Parameters Mode DDD     Lower Rate Limit:  40     Upper Rate Limit 130 PAV 220     Sensed AV Delay:  300  Tachy Zones VF:  200     VT:  170     Next  Cardiology Appt Due:  09/21/2010 Tech Comments:  Outputs reprogrammed for chronic thresholds.  Both ATR everts < 1 minute, + coumadin.  The untreated episode 04/20/10 showed oversensing of noise on both leads.  When I question Mr. Mehra he was at work and accidently touch a live wire.  ROV 3 months with Dr. Graciela Husbands in [place]. Altha Harm, LPN  July 07, 2010 3:16 PM

## 2010-11-21 NOTE — Medication Information (Signed)
Summary: rov/ewj  Anticoagulant Therapy  Managed by: Cloyde Reams, RN, BSN Referring MD: Dr Shirlee Latch PCP: [name], MD Supervising MD: Shirlee Latch MD, Freida Busman Indication 1: Mitral Valve Disorder (ICD-424.0) Lab Used: [facility] Site: [place] INR POC 3.1 INR RANGE 2.0-3.0  Dietary changes: no    Health status changes: no    Bleeding/hemorrhagic complications: no    Recent/future hospitalizations: no    Any changes in medication regimen? no    Recent/future dental: no  Any missed doses?: no       Is patient compliant with meds? yes       Allergies: No Known Drug Allergies  Anticoagulation Management History:      The patient is taking warfarin and comes in today for a routine follow up visit.  Negative risk factors for bleeding include an age less than 75 years old.  The bleeding index is 'low risk'.  Positive CHADS2 values include History of HTN.  Negative CHADS2 values include Age > 24 years old.  The start date was 05/05/2009.  His last INR was 1.36.  Anticoagulation responsible provider: Shirlee Latch MD, [place].  INR POC: 3.1.  Cuvette Lot#: [id number].  Exp: 07/2011.    Anticoagulation Management Assessment/Plan:      The patient's current anticoagulation dose is Warfarin sodium 5 mg tabs: Use as directed by Anticoagulation Clinic.  The target INR is 2.5 - 3.  The next INR is due 07/19/2010.  Anticoagulation instructions were given to patient.  Results were reviewed/authorized by Cloyde Reams, RN, BSN.  He was notified by Cloyde Reams RN.         Prior Anticoagulation Instructions: INR 2.2  Start taking 1 tablet daily except 1/2 tablet on Sundays, Tuesdays, and Thursdays.  Recheck in 3-4 weeks.    Current Anticoagulation Instructions: INR 3.1  Continue on same dosage 1 tablet daily except 1/2 tablet on Sundays, Tuesdays, and Thursdays.  Recheck in 4 weeks.

## 2010-11-21 NOTE — Medication Information (Signed)
Summary: rov/ewj  Anticoagulant Therapy  Managed by: Cloyde Reams, RN, BSN Referring MD: Dr Shirlee Latch PCP: [name], MD Supervising MD: Shirlee Latch MD, Freida Busman Indication 1: Mitral Valve Disorder (ICD-424.0) Lab Used: [facility] Site: [place] INR POC 2.8 INR RANGE 2.0-3.0  Dietary changes: no    Health status changes: no    Bleeding/hemorrhagic complications: no    Recent/future hospitalizations: no    Any changes in medication regimen? no    Recent/future dental: no  Any missed doses?: no       Is patient compliant with meds? yes       Allergies: No Known Drug Allergies  Anticoagulation Management History:      The patient is taking warfarin and comes in today for a routine follow up visit.  Negative risk factors for bleeding include an age less than 71 years old.  The bleeding index is 'low risk'.  Positive CHADS2 values include History of HTN.  Negative CHADS2 values include Age > 18 years old.  The start date was 05/05/2009.  His last INR was 1.36.  Anticoagulation responsible provider: Shirlee Latch MD, [place].  INR POC: 2.8.  Cuvette Lot#: [id number].  Exp: 08/2011.    Anticoagulation Management Assessment/Plan:      The patient's current anticoagulation dose is Warfarin sodium 5 mg tabs: Use as directed by Anticoagulation Clinic.  The target INR is 2.5 - 3.  The next INR is due 08/16/2010.  Anticoagulation instructions were given to patient.  Results were reviewed/authorized by Cloyde Reams, RN, BSN.  He was notified by Cloyde Reams RN.         Prior Anticoagulation Instructions: INR 3.1  Continue on same dosage 1 tablet daily except 1/2 tablet on Sundays, Tuesdays, and Thursdays.  Recheck in 4 weeks.    Current Anticoagulation Instructions: INR 2.8  Continue on same dosage 1/2 tablet daily except 1 tablet on Mondays, Wednesdays, Fridays, and Saturdays.  Recheck in 4 weeks.

## 2010-11-21 NOTE — Procedures (Signed)
Summary: pacer   Current Medications (verified): 1)  Lipitor 80 Mg Tabs (Atorvastatin Calcium) .... Take One Tablet By Mouth Daily. 2)  Nitroglycerin 0.4 Mg Subl (Nitroglycerin) .... One Tablet Under Tongue Every 5 Minutes As Needed For Chest Pain---May Repeat Times Three 3)  Warfarin Sodium 5 Mg Tabs (Warfarin Sodium) .... Use As Directed By Anticoagulation Clinic 4)  Carvedilol 12.5 Mg Tabs (Carvedilol) .Marland Kitchen.. 1 1/2 By Mouth Two Times A Day 5)  Enalapril Maleate 10 Mg Tabs (Enalapril Maleate) .... Take One Tablet By Mouth Twice A Day 6)  Spironolactone 25 Mg Tabs (Spironolactone) .... One By Mouth Daily 7)  Aspirin 81 Mg Tbec (Aspirin) .... Take One Tablet By Mouth Daily  Allergies (verified): No Known Drug Allergies    ICD Specifications Following MD:  Sherryl Manges, MD     ICD Vendor:  [facility] Scientific     ICD Model Number:  E110     ICD Serial Number:  [id number] ICD DOI:  08/25/2009     ICD Implanting MD:  Sherryl Manges, MD  Lead 1:    Location: RA     DOI: 08/25/2009     Model #: [id number]     Serial #: [id number]     Status: active Lead 2:    Location: RV     DOI: 08/25/2009     Model #: [id number]     Serial #: [id number]     Status: active  ICD Follow Up Remote Check?  No Battery Voltage:  Good V     Charge Time:  8.8 seconds     Battery Est. Longevity:  8.5 years Underlying rhythm:  SR ICD Dependent:  No       ICD Device Measurements Atrium:  Amplitude: 6.7 mV, Impedance: 480 ohms, Threshold: 0.5 V at 0.4 msec Right Ventricle:  Amplitude: 25 mV, Impedance: 517 ohms, Threshold: 0.2 V at 0.4 msec Shock Impedance: 49 ohms   Episodes MS Episodes:  0     Percent Mode Switch:  0     Coumadin:  Yes Shock:  0     ATP:  0     Nonsustained:  0     Atrial Pacing:  0%     Ventricular Pacing:  <1%  Brady Parameters Mode DDD     Lower Rate Limit:  40     Upper Rate Limit 130 PAV 220     Sensed AV Delay:  300  Tachy Zones VF:  200     VT:  170     Tech Comments:  Checked by Media planner for research.   No parameter changes.   Follow up as needed by research. Altha Harm, LPN  February 13, 2010 9:04 AM

## 2010-11-21 NOTE — Progress Notes (Signed)
Summary: RX refill Lipitor  Phone Note Refill Request Call back at Home Phone [phone] Message from:  Patient on Feb 24, 2010 11:46 AM  Refills Requested: Medication #1:  LIPITOR 80 MG TABS Take one tablet by mouth daily. [facility] [place]  Initial call taken by: Harlon Flor,  Feb 24, 2010 11:46 AM    Prescriptions: LIPITOR 80 MG TABS (ATORVASTATIN CALCIUM) Take one tablet by mouth daily.  #30 Tablet x 6   Entered by:   Stanton Kidney, EMT-P   Authorized by:   Marca Ancona, MD   Signed by:   Stanton Kidney, EMT-P on 02/24/2010   Method used:   [facility] to        [facility] [phone]* (retail)       [address]       [place], [place]  [address]       Ph: [phone]       Fax: [phone]   RxID:   [id number] LIPITOR 80 MG TABS (ATORVASTATIN CALCIUM) Take one tablet by mouth daily.  #30 Tablet x 6   Entered by:   Stanton Kidney, EMT-P   Authorized by:   Marca Ancona, MD   Signed by:   Stanton Kidney, EMT-P on 02/24/2010   Method used:   Electronically to        [facility] [phone]* (retail)       [address]       [place], [place]  [address]       Ph: [phone]       Fax: [phone]   RxID:   [id number]  Correction-- 1 refill of Lipitor, corrected with Pharmacy.

## 2010-11-21 NOTE — Progress Notes (Signed)
Summary: MED PROBLEMS  Phone Note Call from Patient Call back at Home Phone [phone]   Caller: SELF Call For: [facility] Summary of Call: PT CONTINUES TO PICK UP HIS MEDICATION WITH THE WRONG INSTRUCTIONS-THEREFORE HE DOES NOT HAVE ENOUGH OF THE MEDS-SHOULD BE A 90 DAY SUPPLY OF EVERYTHING-PLEASE CALL PATIENT  Follow-up for Phone Call        spoke with pt and verified directions....wanted a year of refills with 90 day supply Follow-up by: Hardin Negus, RMA,  March 22, 2010 12:51 PM    Prescriptions: SPIRONOLACTONE 25 MG TABS (SPIRONOLACTONE) one by mouth daily  #90 x 4   Entered by:   Hardin Negus, RMA   Authorized by:   Dossie Arbour MD   Signed by:   Hardin Negus, RMA on 03/22/2010   Method used:   [facility]ically to        [facility] [phone]* (retail)       [address]       [place], [place]  [address]       Ph: [phone]       Fax: [phone]   RxID:   [id number] ENALAPRIL MALEATE 10 MG TABS (ENALAPRIL MALEATE) Take one tablet by mouth twice a day  #180 x 4   Entered by:   Hardin Negus, RMA   Authorized by:   Dossie Arbour MD   Signed by:   Hardin Negus, RMA on 03/22/2010   Method used:   Electronically to        [facility] [phone]* (retail)       [address]       [place], [place]  [address]       Ph: [phone]       Fax: [phone]   RxID:   [id number] CARVEDILOL 12.5 MG TABS (CARVEDILOL) 1 1/2 by mouth two times a day  #270 x 4   Entered by:   Hardin Negus, RMA   Authorized by:   Dossie Arbour MD   Signed by:   Hardin Negus, RMA on 03/22/2010   Method used:   Electronically to        [facility] [phone]* (retail)       [address]       [place], [place]  [address]       Ph: [phone]       Fax: [phone]   RxID:   [id number] WARFARIN SODIUM 5 MG TABS (WARFARIN SODIUM) Use as directed by Anticoagulation Clinic  #90 x 4   Entered by:   Hardin Negus, RMA   Authorized by:   Dossie Arbour MD   Signed by:   Hardin Negus, RMA on 03/22/2010   Method used:   Electronically to        [facility] [phone]* (retail)       [address]       [place], [place]  [address]       Ph: [phone]       Fax: [phone]   RxID:   [id number] NITROGLYCERIN 0.4 MG SUBL (NITROGLYCERIN) One tablet under tongue every 5 minutes as needed for chest pain---may repeat times three  #25 x 11   Entered by:   Hardin Negus, RMA  Authorized by:   Dossie Arbour MD   Signed by:   Hardin Negus, RMA on 03/22/2010   Method used:   Electronically to        [facility] [phone]* (retail)       [address]       [place], [place]  [address]       Ph: [phone]       Fax: [phone]   RxID:   [id number] LIPITOR 80 MG TABS (ATORVASTATIN CALCIUM) Take one tablet by mouth daily.  #90 x 4   Entered by:   Hardin Negus, RMA   Authorized by:   Dossie Arbour MD   Signed by:   Hardin Negus, RMA on 03/22/2010   Method used:   Electronically to        [facility] [phone]* (retail)       [address]       [place], [place]  [address]       Ph: [phone]       Fax: [phone]   RxID:   [id number]

## 2010-11-21 NOTE — Medication Information (Signed)
Summary: CCR/AMD  Anticoagulant Therapy  Managed by: [name], RN, BSN Referring MD: Dr Shirlee Latch PCP: [name], MD Supervising MD: Mariah Milling Indication 1: Mitral Valve Disorder (ICD-424.0) Lab Used: [facility] Site: [place] INR POC 2.0 INR RANGE 2.0-3.0  Dietary changes: no    Health status changes: yes       Details: Cut Left hand while work, area bandaged no redness noted but some swelling around bandage. Pt aware to continue to assess site.   Bleeding/hemorrhagic complications: no    Recent/future hospitalizations: no    Any changes in medication regimen? no    Recent/future dental: no  Any missed doses?: no       Is patient compliant with meds? yes       Allergies: No Known Drug Allergies  Anticoagulation Management History:      The patient is taking warfarin and comes in today for a routine follow up visit.  Negative risk factors for bleeding include an age less than 21 years old.  The bleeding index is 'low risk'.  Positive CHADS2 values include History of HTN.  Negative CHADS2 values include Age > 48 years old.  The start date was 05/05/2009.  His last INR was 1.36.  Anticoagulation responsible provider: [name].  INR POC: 2.0.  Cuvette Lot#: [id number].  Exp: 09/2011.    Anticoagulation Management Assessment/Plan:      The patient's current anticoagulation dose is Warfarin sodium 5 mg tabs: Use as directed by Anticoagulation Clinic.  The target INR is 2.0-3.0.  The next INR is due 10/11/2010.  Anticoagulation instructions were given to patient.  Results were reviewed/authorized by [name], RN, BSN.  He was notified by [name], RN, BSN.         Prior Anticoagulation Instructions: INR 2.8  Continue on same dosage 1 tablet daily except 1/2 tablet on Sundays, Tuesdays, and Thursdays.  Recheck in 4 weeks.    Current Anticoagulation Instructions: INR 2.0 Today take 7.5mg s then Continue 5mg s everyday except 2.5mg s on  Sundays, Tuesdays and Thursdays. Recheck in 2 weeks.

## 2010-11-21 NOTE — Cardiovascular Report (Signed)
Summary: Office Visit   Office Visit   Imported By: Roderic Ovens 02/20/2010 15:25:52  _____________________________________________________________________  External Attachment:    Type:   Image     Comment:   External Document

## 2010-11-21 NOTE — Medication Information (Signed)
Summary: rov/ewj  Anticoagulant Therapy  Managed by: Cloyde Reams, RN, BSN Referring MD: Dr Shirlee Latch PCP: [name], MD Supervising MD: Mariah Milling Indication 1: Mitral Valve Disorder (ICD-424.0) Lab Used: [facility] Site: [place] INR POC 2.8 INR RANGE 2.0-3.0  Dietary changes: no    Health status changes: no    Bleeding/hemorrhagic complications: no    Recent/future hospitalizations: no    Any changes in medication regimen? no    Recent/future dental: no  Any missed doses?: no       Is patient compliant with meds? yes       Allergies: No Known Drug Allergies  Anticoagulation Management History:      The patient is taking warfarin and comes in today for a routine follow up visit.  Negative risk factors for bleeding include an age less than 86 years old.  The bleeding index is 'low risk'.  Positive CHADS2 values include History of HTN.  Negative CHADS2 values include Age > 77 years old.  The start date was 05/05/2009.  His last INR was 1.36.  Anticoagulation responsible provider: [facility].  INR POC: 2.8.  Cuvette Lot#: [id number].  Exp: 08/2011.    Anticoagulation Management Assessment/Plan:      The patient's current anticoagulation dose is Warfarin sodium 5 mg tabs: Use as directed by Anticoagulation Clinic.  The target INR is 2.5 - 3.  The next INR is due 09/13/2010.  Anticoagulation instructions were given to patient.  Results were reviewed/authorized by Cloyde Reams, RN, BSN.  He was notified by Cloyde Reams RN.         Prior Anticoagulation Instructions: INR 2.8  Continue on same dosage 1/2 tablet daily except 1 tablet on Mondays, Wednesdays, Fridays, and Saturdays.  Recheck in 4 weeks.    Current Anticoagulation Instructions: INR 2.8  Continue on same dosage 1 tablet daily except 1/2 tablet on Sundays, Tuesdays, and Thursdays.  Recheck in 4 weeks.

## 2010-11-21 NOTE — Medication Information (Signed)
Summary: ccr  Anticoagulant Therapy  Managed by: Cloyde Reams, RN, BSN PCP: [name], MD Supervising MD: Mariah Milling Indication 1: Mitral Valve Disorder (ICD-424.0) Lab Used: [facility] Site: [place] INR POC 1.7 INR RANGE 2.0-3.0  Dietary changes: no     Bleeding/hemorrhagic complications: no     Any changes in medication regimen? yes       Details: Increased carvedilol.   Any missed doses?: no       Is patient compliant with meds? yes       Allergies: No Known Drug Allergies  Anticoagulation Management History:      The patient is taking warfarin and comes in today for a routine follow up visit.  Negative risk factors for bleeding include an age less than 65 years old.  The bleeding index is 'low risk'.  Positive CHADS2 values include History of HTN.  Negative CHADS2 values include Age > 68 years old.  The start date was 05/05/2009.  His last INR was 1.36.  Anticoagulation responsible provider: Brytni Dray.  INR POC: 1.7.  Cuvette Lot#: [id number].  Exp: 02/2011.    Anticoagulation Management Assessment/Plan:      The patient's current anticoagulation dose is Warfarin sodium 5 mg tabs: Use as directed by Anticoagulation Clinic.  The target INR is 2.5 - 3.  The next INR is due 03/21/2010.  Anticoagulation instructions were given to patient.  Results were reviewed/authorized by Cloyde Reams, RN, BSN.  He was notified by Cloyde Reams RN.         Prior Anticoagulation Instructions: The patient is to continue with the same dose of coumadin.  This dosage includes: coumadin 2.5 mg daily with 5 mg on M W F   Current Anticoagulation Instructions: INR 1.7  Take an extra 1/2 tablet today, then start taking 1 tablet daily except 1/2 tablet on Tuesdays, Thursdays, and Saturdays.  Recheck in 3 weeks.

## 2010-11-21 NOTE — Medication Information (Signed)
Summary: CCR  Anticoagulant Therapy  Managed by: Robyn Haber, RN, BSN PCP: [name], MD Supervising MD: Mariah Milling Indication 1: Mitral Valve Disorder (ICD-424.0) Lab Used: [facility] Site: [place] INR POC 2.4 INR RANGE 2.0-3.0  Dietary changes: no    Health status changes: no    Bleeding/hemorrhagic complications: no    Recent/future hospitalizations: no    Any changes in medication regimen? no    Recent/future dental: no  Any missed doses?: no       Is patient compliant with meds? yes       Allergies: No Known Drug Allergies  Anticoagulation Management History:      The patient is taking warfarin and comes in today for a routine follow up visit.  Negative risk factors for bleeding include an age less than 21 years old.  The bleeding index is 'low risk'.  Positive CHADS2 values include History of HTN.  Negative CHADS2 values include Age > 32 years old.  The start date was 05/05/2009.  His last INR was 1.36.  Anticoagulation responsible provider: Jules Baty.  INR POC: 2.4.    Anticoagulation Management Assessment/Plan:      The patient's current anticoagulation dose is Warfarin sodium 5 mg tabs: Use as directed by Anticoagulation Clinic.  The target INR is 2.5 - 3.  The next INR is due 01/26/2010.  Anticoagulation instructions were given to patient.  Results were reviewed/authorized by Robyn Haber, RN, BSN.  He was notified by Charlena Cross, RN, BSN.         Prior Anticoagulation Instructions: The patient is to continue with the same dose of coumadin.  This dosage includes: coumadin 2.5 mg daily with 5 mg on M W F   Current Anticoagulation Instructions: The patient is to continue with the same dose of coumadin.  This dosage includes: coumadin 2.5 mg daily with 5 mg on M W F

## 2010-11-21 NOTE — Medication Information (Signed)
Summary: CCR/NE  Anticoagulant Therapy  Managed by: Cloyde Reams, RN, BSN Referring MD: Dr Shirlee Latch PCP: [name], MD Supervising MD: Mariah Milling Indication 1: Mitral Valve Disorder (ICD-424.0) Lab Used: [facility]  Site: [place] INR POC 2.2 INR RANGE 2.0-3.0  Dietary changes: no    Health status changes: no    Bleeding/hemorrhagic complications: no    Recent/future hospitalizations: no    Any changes in medication regimen? no    Recent/future dental: no  Any missed doses?: no       Is patient compliant with meds? yes       Allergies: No Known Drug Allergies  Anticoagulation Management History:      The patient is taking warfarin and comes in today for a routine follow up visit.  Negative risk factors for bleeding include an age less than 75 years old.  The bleeding index is 'low risk'.  Positive CHADS2 values include History of HTN.  Negative CHADS2 values include Age > 4 years old.  The start date was 05/05/2009.  His last INR was 1.36.  Anticoagulation responsible Randeep Biondolillo: [facility].  INR POC: 2.2.  Cuvette Lot#: [id number].  Exp: 03/2011.    Anticoagulation Management Assessment/Plan:      The patient's current anticoagulation dose is Warfarin sodium 5 mg tabs: Use as directed by Anticoagulation Clinic.  The target INR is 2.5 - 3.  The next INR is due 06/21/2010.  Anticoagulation instructions were given to patient.  Results were reviewed/authorized by Cloyde Reams, RN, BSN.  He was notified by Cloyde Reams RN.         Prior Anticoagulation Instructions: INR 1.9  Take an extra 1/2 tablet today, then resume same dosage 1/2 tablet daily except 1 tablet Mondays, Wednesdays, and Fridays.  Recheck in 4 weeks.    Current Anticoagulation Instructions: INR 2.2  Start taking 1 tablet daily except 1/2 tablet on Sundays, Tuesdays, and Thursdays.  Recheck in 3-4 weeks.

## 2010-11-21 NOTE — Cardiovascular Report (Signed)
Summary: Office Visit   Office Visit   Imported By: Roderic Ovens 07/17/2010 16:23:56  _____________________________________________________________________  External Attachment:    Type:   Image     Comment:   External Document

## 2010-11-21 NOTE — Progress Notes (Signed)
Summary: MEDICAL CLEARANCE  MEDICAL CLEARANCE   Imported By: Frazier Butt Chriscoe 04/19/2010 10:12:48  _____________________________________________________________________  External Attachment:    Type:   Image     Comment:   External Document

## 2010-11-21 NOTE — Medication Information (Signed)
Summary: CCR/AMD  Anticoagulant Therapy  Managed by: Robyn Haber, RN, BSN PCP: [name], MD Supervising MD: Gala Romney MD, Reuel Boom Indication 1: Mitral Valve Disorder (ICD-424.0) Lab Used: [facility] Site: [place] INR POC 1.8 INR RANGE 2.0-3.0  Dietary changes: no    Health status changes: no    Bleeding/hemorrhagic complications: no    Recent/future hospitalizations: no    Any changes in medication regimen? no    Recent/future dental: no  Any missed doses?: no         Allergies: No Known Drug Allergies  Anticoagulation Management History:      The patient is taking warfarin and comes in today for a routine follow up visit.  Negative risk factors for bleeding include an age less than 16 years old.  The bleeding index is 'low risk'.  Positive CHADS2 values include History of HTN.  Negative CHADS2 values include Age > 62 years old.  The start date was 05/05/2009.  His last INR was 1.36.  Anticoagulation responsible provider: Dary Dilauro MD, Reuel Boom.  INR POC: 1.8.    Anticoagulation Management Assessment/Plan:      The patient's current anticoagulation dose is Warfarin sodium 5 mg tabs: Use as directed by Anticoagulation Clinic.  The target INR is 2.5 - 3.  The next INR is due 02/28/2010.  Anticoagulation instructions were given to patient.  Results were reviewed/authorized by Robyn Haber, RN, BSN.  He was notified by Charlena Cross, RN, BSN.         Prior Anticoagulation Instructions: The patient is to continue with the same dose of coumadin.  This dosage includes: coumadin 2.5 mg daily with 5 mg on M W F  Current Anticoagulation Instructions: coumadin 5 mg today then coumadin 2.5 mg daily 5 mg on M W F

## 2010-11-21 NOTE — Medication Information (Signed)
Summary: Cou[facility] Clinic  Anticoagulant Therapy  Managed by: Robyn Haber, RN, BSN PCP: [name], MD Supervising MD: Graciela Husbands MD, Viviann Spare Indication 1: Mitral Valve Disorder (ICD-424.0) Lab Used: [facility]  Site: [place] INR POC 2.5 INR RANGE 2.0-3.0  Dietary changes: no    Health status changes: no    Bleeding/hemorrhagic complications: no    Recent/future hospitalizations: no    Any changes in medication regimen? no    Recent/future dental: no  Any missed doses?: no       Is patient compliant with meds? yes       Allergies: No Known Drug Allergies  Anticoagulation Management History:      The patient is taking warfarin and comes in today for a routine follow up visit.  Negative risk factors for bleeding include an age less than 17 years old.  The bleeding index is 'low risk'.  Positive CHADS2 values include History of HTN.  Negative CHADS2 values include Age > 3 years old.  The start date was 05/05/2009.  His last INR was 1.36.  Anticoagulation responsible provider: Graciela Husbands MD, Viviann Spare.  INR POC: 2.5.    Anticoagulation Management Assessment/Plan:      The patient's current anticoagulation dose is Warfarin sodium 5 mg tabs: Use as directed by Anticoagulation Clinic.  The target INR is 2.5 - 3.  The next INR is due 03/13/2010.  Anticoagulation instructions were given to patient.  Results were reviewed/authorized by Robyn Haber, RN, BSN.  He was notified by Charlena Cross, RN, BSN.         Prior Anticoagulation Instructions: coumadin 5 mg today then coumadin 2.5 mg daily 5 mg on M W F   Current Anticoagulation Instructions: The patient is to continue with the same dose of coumadin.  This dosage includes: coumadin 2.5 mg daily with 5 mg on M W F

## 2010-11-23 NOTE — Assessment & Plan Note (Signed)
Summary: DEVICE CHECK/AMD   Visit Type:  Follow-up Referring Provider:  Marca Ancona, MD Primary Provider:  None  CC:  "doing good" denies chest pain, SOB, and palpitations..  History of Present Illness:  [name] [name] is seen in followup for an ICD implanted for primary prevention in the setting of ischemic heart disease prior bypass surgery and ejection fraction of 20-25%. He also has an apical thrombus for which he takes Coumadin.  He is enrolled in the MADIT-R IT trial. He's had no problems with his defibrillator since implantation. The patient denies SOB, chest pain, edema or palpitations   Echo was reviewed from 6/11, showing EF 40% (improved) with dyskinetic apex.    ECG: NSR, old inferior and anterior MIs with inferior and anterolateral T wave inversions  Labs (12/10): creatinine 0.85 Labs (4/11): K 4.5, creatinine 0.87, LDL 71, HDL 36  He told me a story regarding his hand injury;   Current Medications (verified): 1)  Lipitor 80 Mg Tabs (Atorvastatin Calcium) .... Take One Tablet By Mouth Daily. 2)  Nitroglycerin 0.4 Mg Subl (Nitroglycerin) .... One Tablet Under Tongue Every 5 Minutes As Needed For Chest Pain---May Repeat Times Three 3)  Warfarin Sodium 5 Mg Tabs (Warfarin Sodium) .... Use As Directed By Anticoagulation Clinic 4)  Carvedilol 25 Mg Tabs (Carvedilol) .... Take One Tablet By Mouth Twice A Day 5)  Enalapril Maleate 10 Mg Tabs (Enalapril Maleate) .... Take One Tablet By Mouth Twice A Day 6)  Spironolactone 25 Mg Tabs (Spironolactone) .... One By Mouth Daily 7)  Aspirin 81 Mg Tbec (Aspirin) .... Take One Tablet By Mouth Daily 8)  Fish Oil 1200 Mg Caps (Omega-3 Fatty Acids) .Marland Kitchen.. 1 Tab Daily  Allergies (verified): No Known Drug Allergies  Past History:  Past Medical History: Last updated: 08/29/2010 1.  Hyperlipidemia 2.  Smoking: quit 7/10 3.  CAD:  LHC (7/10) with EF 35%, RCA totally occluded, ostial LAD totally occluded, mid CFX totally occluded.  Large  OM1 with collaterals to the LAD and RCA.  CABG with LIMA-LAD, RIMA-RCA.   4.  Moderate MR: MV repair/annuloplasty ring at time of CABG (7/10). 5.  Ischemic CMP: echo (7/10) with mild LV dilatation, EF 25-30%.  Anteroseptal, inferoseptal, inferior, and apical akinesis.  RV normal.  Moderate MR.  Repeat echo (10/10) EF 20%, mild to moderately dilated, dyskinetic apex, akinetic septum, severe hypokinesis of the inferior and anterior walls. Mild MR.  LV apical thrombus.  Repeat echo (6/11): EF 40%, moderately dilated LV, mild LVH, apical anterior wall/apical septal wall/apex dyskinetic, s/p MV repair with no MS or MR. LV apical thrombus not seen on this echo.  6.  Atrial fibrillation: post-op CABG.  7.  LV apical thrombus: on coumadin.  8.  [employer] dual chamber AICD (11/10)  Past Surgical History: Last updated: 08/15/2009 Knee surgery x 4 CAABG  Family History: Last updated: 04/29/2009 Father with MI at 61 Mother with CHF Hyperlipidemia runs in family  Social History: Last updated: 02/09/2010 Lives with wife in [place].  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  Originally from [place].  Smoked 1 ppd x years, quit 7/10.  Occasional ETOH.  No drugs.   Vital Signs:  Patient profile:   50 year old male Height:      69 inches Weight:      210 pounds BMI:     31.12 Pulse rate:   65 / minute BP sitting:   104 / 68  (  left arm) Cuff size:   regular  Vitals Entered By: Lysbeth Galas CMA (October 25, 2010 4:44 PM)  Physical Exam  General:  The patient was alert and oriented in no acute distress. HEENT Normal.  Neck veins were flat, carotids were brisk.  Lungs were clear.  Heart sounds were regular without murmurs or gallops.  Abdomen was soft with active bowel sounds. There is no clubbing cyanosis or edema. Skin Warm and dry; healing wound on left hand with some eryhtmema     ICD Specifications Following MD:  Sherryl Manges, MD     ICD  Vendor:  [employer] [facility] [employer] [facility] [employer]     ICD Model Number:  E110     ICD Serial Number:  [id number] ICD DOI:  08/25/2009     ICD Implanting MD:  Sherryl Manges, MD Research Study Name MADIT RIT  Lead 1:    Location: RA     DOI: 08/25/2009     Model #: [id number]     Serial #: [id number]     Status: active Lead 2:    Location: RV     DOI: 08/25/2009     Model #: [id number]     Serial #: [id number]     Status: active  ICD Follow Up Remote Check?  No Charge Time:  9.0 seconds     Battery Est. Longevity:  10.5 years Underlying rhythm:  SR ICD Dependent:  No       ICD Device Measurements Atrium:  Amplitude: 4.4 mV, Impedance: 436 ohms, Threshold: 0.6 V at 0.4 msec Right Ventricle:  Amplitude: 25 mV, Impedance: 461 ohms, Threshold: 0.7 V at 0.4 msec Shock Impedance: 46 ohms   Episodes MS Episodes:  0     Percent Mode Switch:  0     Coumadin:  Yes Shock:  0     ATP:  0     Nonsustained:  2     Atrial Pacing:  <1%     Ventricular Pacing:  <1%  Brady Parameters Mode DDD     Lower Rate Limit:  40     Upper Rate Limit 130 PAV 220     Sensed AV Delay:  300  Tachy Zones VF:  200     VT:  170     Next Cardiology Appt Due:  01/21/2011 Tech Comments:  No parameter changes.  Device function normal.  checked by industry.  ROV 3 months [facility]. Altha Harm, LPN  October 25, 2010 5:07 PM   Impression & Recommendations:  Problem # 1:  IMPLANTABLE DEFIBRILLATOR BS MADIR-RIT (ICD-V45.02) Device parameters and data were reviewed and no changes were made  Problem # 2:  CAD, AUTOLOGOUS BYPASS GRAFT (ICD-414.02) stble on current meds;  willhave him stop his ASA as he is taking coumadin His updated medication list for this problem includes:    Nitroglycerin 0.4 Mg Subl (Nitroglycerin) ..... One tablet under tongue every 5 minutes as needed for chest pain---may repeat times three    Warfarin Sodium 5 Mg Tabs (Warfarin sodium) ..... Use as directed by anticoagulation clinic    Carvedilol 25 Mg Tabs (Carvedilol) .Marland Kitchen... Take one  tablet by mouth twice a day    Enalapril Maleate 10 Mg Tabs (Enalapril maleate) .Marland Kitchen... Take one tablet by mouth twice a day    Aspirin 81 Mg Tbec (Aspirin) .Marland Kitchen... Take one tablet by mouth daily  Problem # 3:  CARDIOMYOPATHY, ISCHEMIC (ICD-414.8) as above His updated medication list for this problem includes:  Nitroglycerin 0.4 Mg Subl (Nitroglycerin) ..... One tablet under tongue every 5 minutes as needed for chest pain---may repeat times three    Warfarin Sodium 5 Mg Tabs (Warfarin sodium) ..... Use as directed by anticoagulation clinic    Carvedilol 25 Mg Tabs (Carvedilol) .Marland Kitchen... Take one tablet by mouth twice a day    Enalapril Maleate 10 Mg Tabs (Enalapril maleate) .Marland Kitchen... Take one tablet by mouth twice a day    Spironolactone 25 Mg Tabs (Spironolactone) ..... One by mouth daily    Aspirin 81 Mg Tbec (Aspirin) .Marland Kitchen... Take one tablet by mouth daily  Problem # 4:  WOUND, HAND (ICD-882.0) There is some erythema ; i told him if the redness worsens or soreness increases that he should seek out a hand surgeon;  this is ipsilateral tothe device;  pocket looks fine  Patient Instructions: 1)  Your physician recommends that you schedule a follow-up appointment in: 3 MONTHS 2)  Your physician recommends that you continue on your current medications as directed. Please refer to the Current Medication list given to you today.

## 2010-11-23 NOTE — Medication Information (Signed)
Summary: rov/ewj  Anticoagulant Therapy  Managed by: Lanny Hurst, RN Referring MD: Dr Shirlee Latch PCP: None Supervising MD: Shirlee Latch MD, Anaston Koehn Indication 1: Mitral Valve Disorder (ICD-424.0) Lab Used: [facility] Site: [place] INR POC 2.7 INR RANGE 2.0-3.0  Dietary changes: no    Health status changes: no    Bleeding/hemorrhagic complications: no    Recent/future hospitalizations: no    Any changes in medication regimen? no    Recent/future dental: no  Any missed doses?: no       Is patient compliant with meds? yes       Allergies: No Known Drug Allergies  Anticoagulation Management History:      Negative risk factors for bleeding include an age less than 88 years old.  The bleeding index is 'low risk'.  Positive CHADS2 values include History of HTN.  Negative CHADS2 values include Age > 68 years old.  The start date was 05/05/2009.  His last INR was 1.36.  Anticoagulation responsible provider: Shirlee Latch MD, Sharyl Panchal.  INR POC: 2.7.  Exp: 09/2011.    Anticoagulation Management Assessment/Plan:      The patient's current anticoagulation dose is Warfarin sodium 5 mg tabs: Use as directed by Anticoagulation Clinic.  The target INR is 2.0-3.0.  The next INR is due 12/11/2010.  Anticoagulation instructions were given to patient.  Results were reviewed/authorized by Lanny Hurst, RN.  He was notified by Lanny Hurst RN.         Prior Anticoagulation Instructions: INR 2.5  Continue on same dosage 5mg  daily except 2.5mg  on Sundays, Tuesdays, and Thursdays.  Recheck in 4 weeks.   Current Anticoagulation Instructions: INR 2.7  Continue same dosage 1 tablet every day except 1/2 tablet on Sundays, Tuesdays, and Thursdays. Recheck in 4 weeks.

## 2010-11-23 NOTE — Progress Notes (Signed)
  Request received from [facility] for [facility] sent to [employer]. [facility] [place]  October 06, 2010 8:33 AM

## 2010-11-23 NOTE — Medication Information (Signed)
Summary: rov/tm  Anticoagulant Therapy  Managed by: Cloyde Reams, RN, BSN Referring MD: Dr Shirlee Latch PCP: [name], MD Supervising MD: Mariah Milling Indication 1: Mitral Valve Disorder (ICD-424.0) Lab Used: [facility] Site: [place] INR POC 2.5 INR RANGE 2.0-3.0  Dietary changes: no    Health status changes: no    Bleeding/hemorrhagic complications: no    Recent/future hospitalizations: no    Any changes in medication regimen? no    Recent/future dental: no  Any missed doses?: no       Is patient compliant with meds? yes       Allergies: No Known Drug Allergies  Anticoagulation Management History:      The patient is taking warfarin and comes in today for a routine follow up visit.  Negative risk factors for bleeding include an age less than 68 years old.  The bleeding index is 'low risk'.  Positive CHADS2 values include History of HTN.  Negative CHADS2 values include Age > 19 years old.  The start date was 05/05/2009.  His last INR was 1.36.  Anticoagulation responsible provider: [facility].  INR POC: 2.5.  Cuvette Lot#: [id number].  Exp: 09/2011.    Anticoagulation Management Assessment/Plan:      The patient's current anticoagulation dose is Warfarin sodium 5 mg tabs: Use as directed by Anticoagulation Clinic.  The target INR is 2.0-3.0.  The next INR is due 11/08/2010.  Anticoagulation instructions were given to patient.  Results were reviewed/authorized by Cloyde Reams, RN, BSN.  He was notified by Cloyde Reams RN.         Prior Anticoagulation Instructions: INR 2.0 Today take 7.5mg s then Continue 5mg s everyday except 2.5mg s on Sundays, Tuesdays and Thursdays. Recheck in 2 weeks.   Current Anticoagulation Instructions: INR 2.5  Continue on same dosage 5mg  daily except 2.5mg  on Sundays, Tuesdays, and Thursdays.  Recheck in 4 weeks.

## 2010-11-30 ENCOUNTER — Telehealth (INDEPENDENT_AMBULATORY_CARE_PROVIDER_SITE_OTHER): Payer: Self-pay | Admitting: *Deleted

## 2010-12-13 NOTE — Progress Notes (Signed)
Summary: Medication dosage changes  Phone Note Call from Patient Call back at Home Phone [phone]   Caller: Self Call For: Shirlee Latch Summary of Call: Pt noticed that there dosages with Spirnolactone and Enalipril is different on his last refill. Initial call taken by: Harlon Flor,  November 30, 2010 10:43 AM  Follow-up for Phone Call        I called patient and verified Spironolactone and Enalapril dose per last OV device check.  Per patient he knew that medicine was changed in November.  Medication list also states these values, but at bottom of note Rx sent in for Different quantity.  Will recheck with RN. Follow-up by: Judithe Modest CMA,  December 01, 2010 9:27 AM

## 2010-12-20 ENCOUNTER — Encounter (INDEPENDENT_AMBULATORY_CARE_PROVIDER_SITE_OTHER): Payer: Self-pay

## 2010-12-20 ENCOUNTER — Encounter: Payer: Self-pay | Admitting: Cardiovascular Disease

## 2010-12-20 DIAGNOSIS — Z954 Presence of other heart-valve replacement: Secondary | ICD-10-CM

## 2010-12-20 DIAGNOSIS — I059 Rheumatic mitral valve disease, unspecified: Secondary | ICD-10-CM

## 2010-12-20 DIAGNOSIS — Z7901 Long term (current) use of anticoagulants: Secondary | ICD-10-CM

## 2010-12-25 IMAGING — CR DG CHEST 2V
1 series · 2 of 2 positions shown · non-contrast
Comparison: none

REASON FOR EXAM: pain
COMMENTS:

PROCEDURE:     DXR - DXR CHEST PA (OR AP) AND LATERAL  - April 28, 2009  [DATE]
RESULT:     The lungs are adequately inflated. There is no focal infiltrate.
The heart is normal in size. The pulmonary vascularity is not engorged. I
see no pleural effusion.

[Series 1: view not recorded · 0.17mm/px · 2 of 2 slices shown]
[im 1/2]
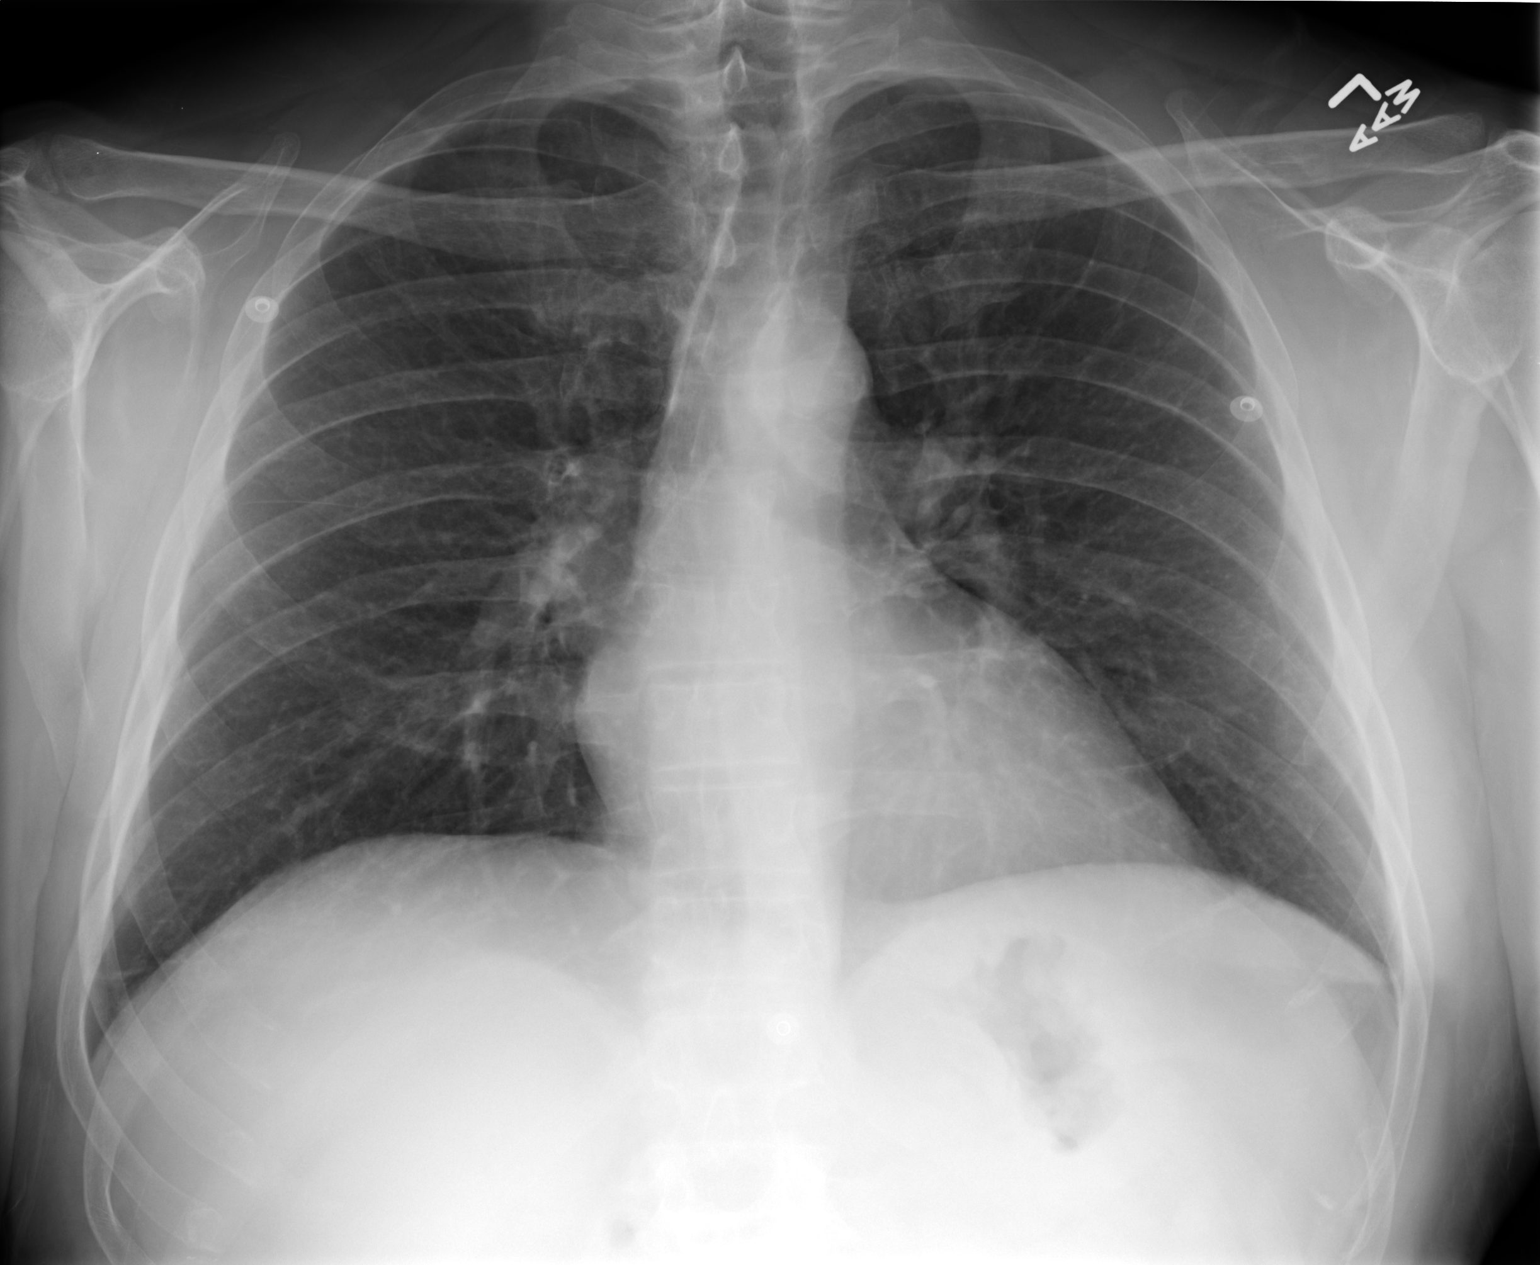
[im 2/2]
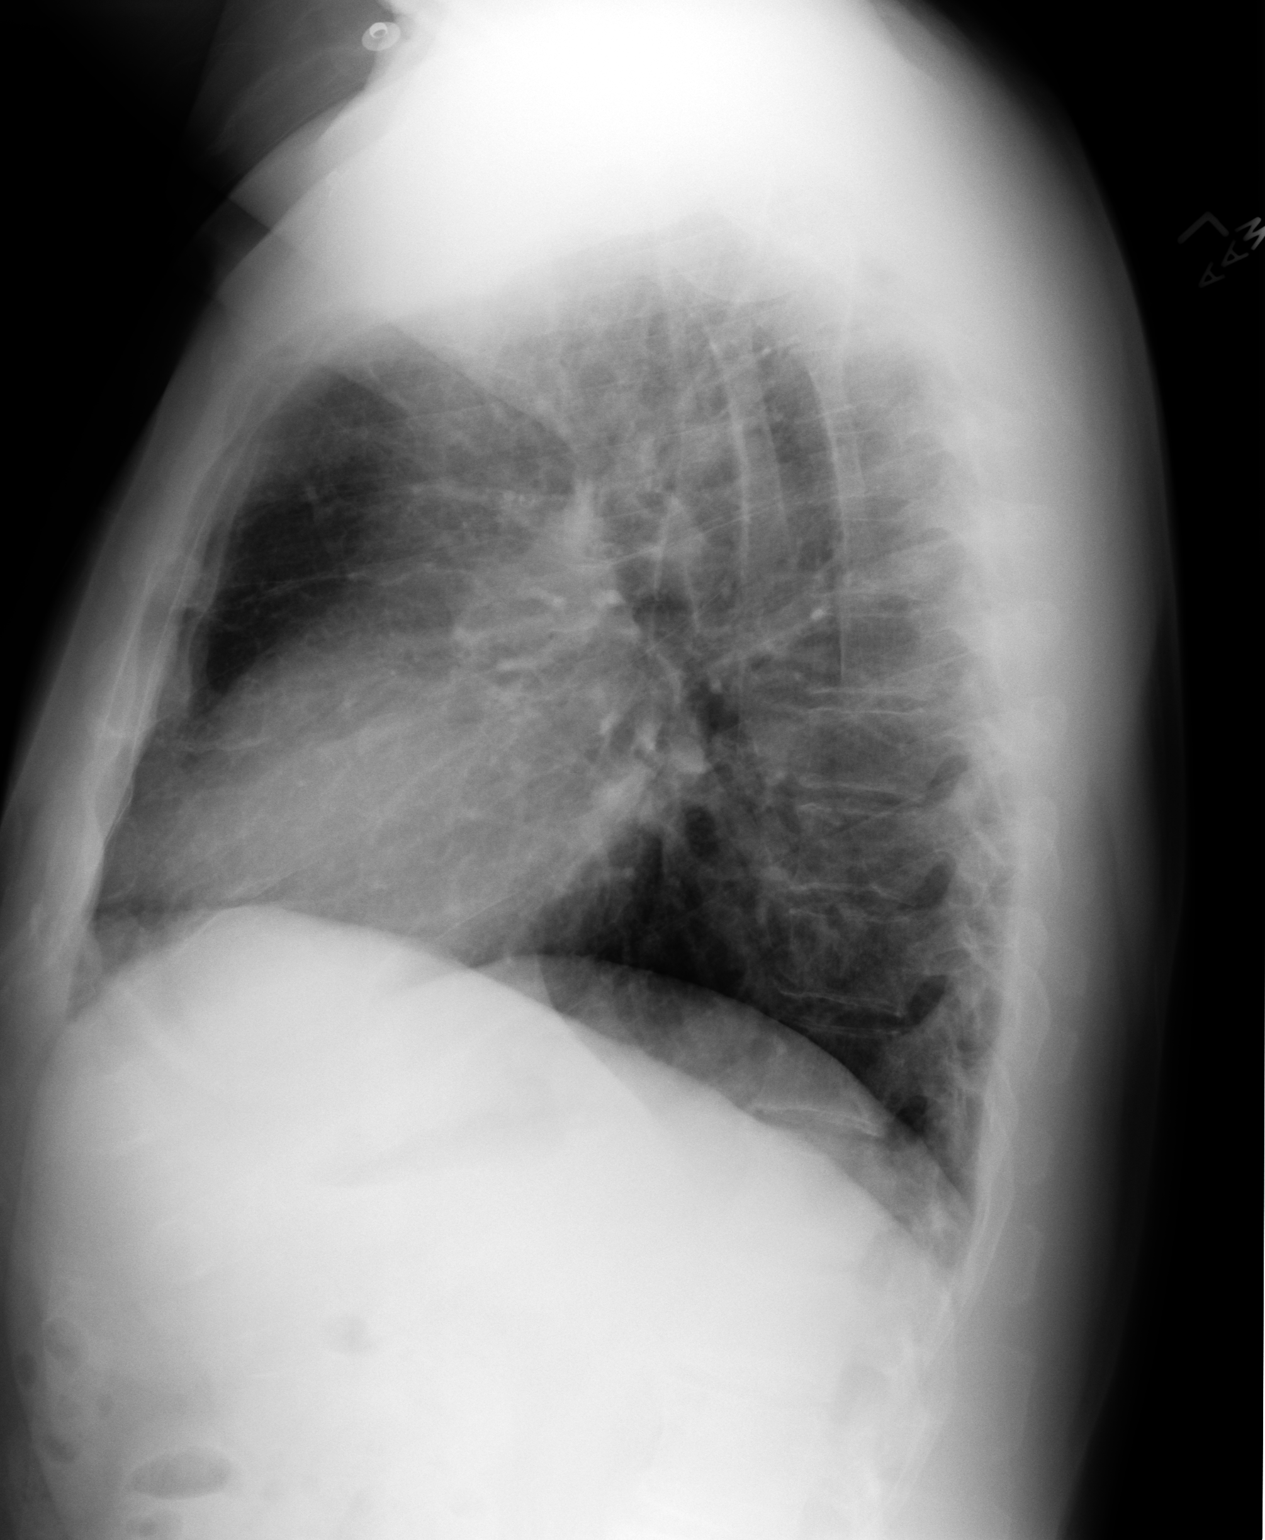

[2 of 2 positions shown; findings below may reference images not displayed]

IMPRESSION: I do not see evidence of acute cardiopulmonary abnormality.

## 2010-12-25 IMAGING — CR DG SHOULDER 3+V*R*
1 series · 3 of 3 positions shown · non-contrast
Comparison: none

REASON FOR EXAM: pain with abduction
COMMENTS:

[Series 1: view not recorded · 0.17mm/px · 3 of 3 slices shown]
[im 1/3]
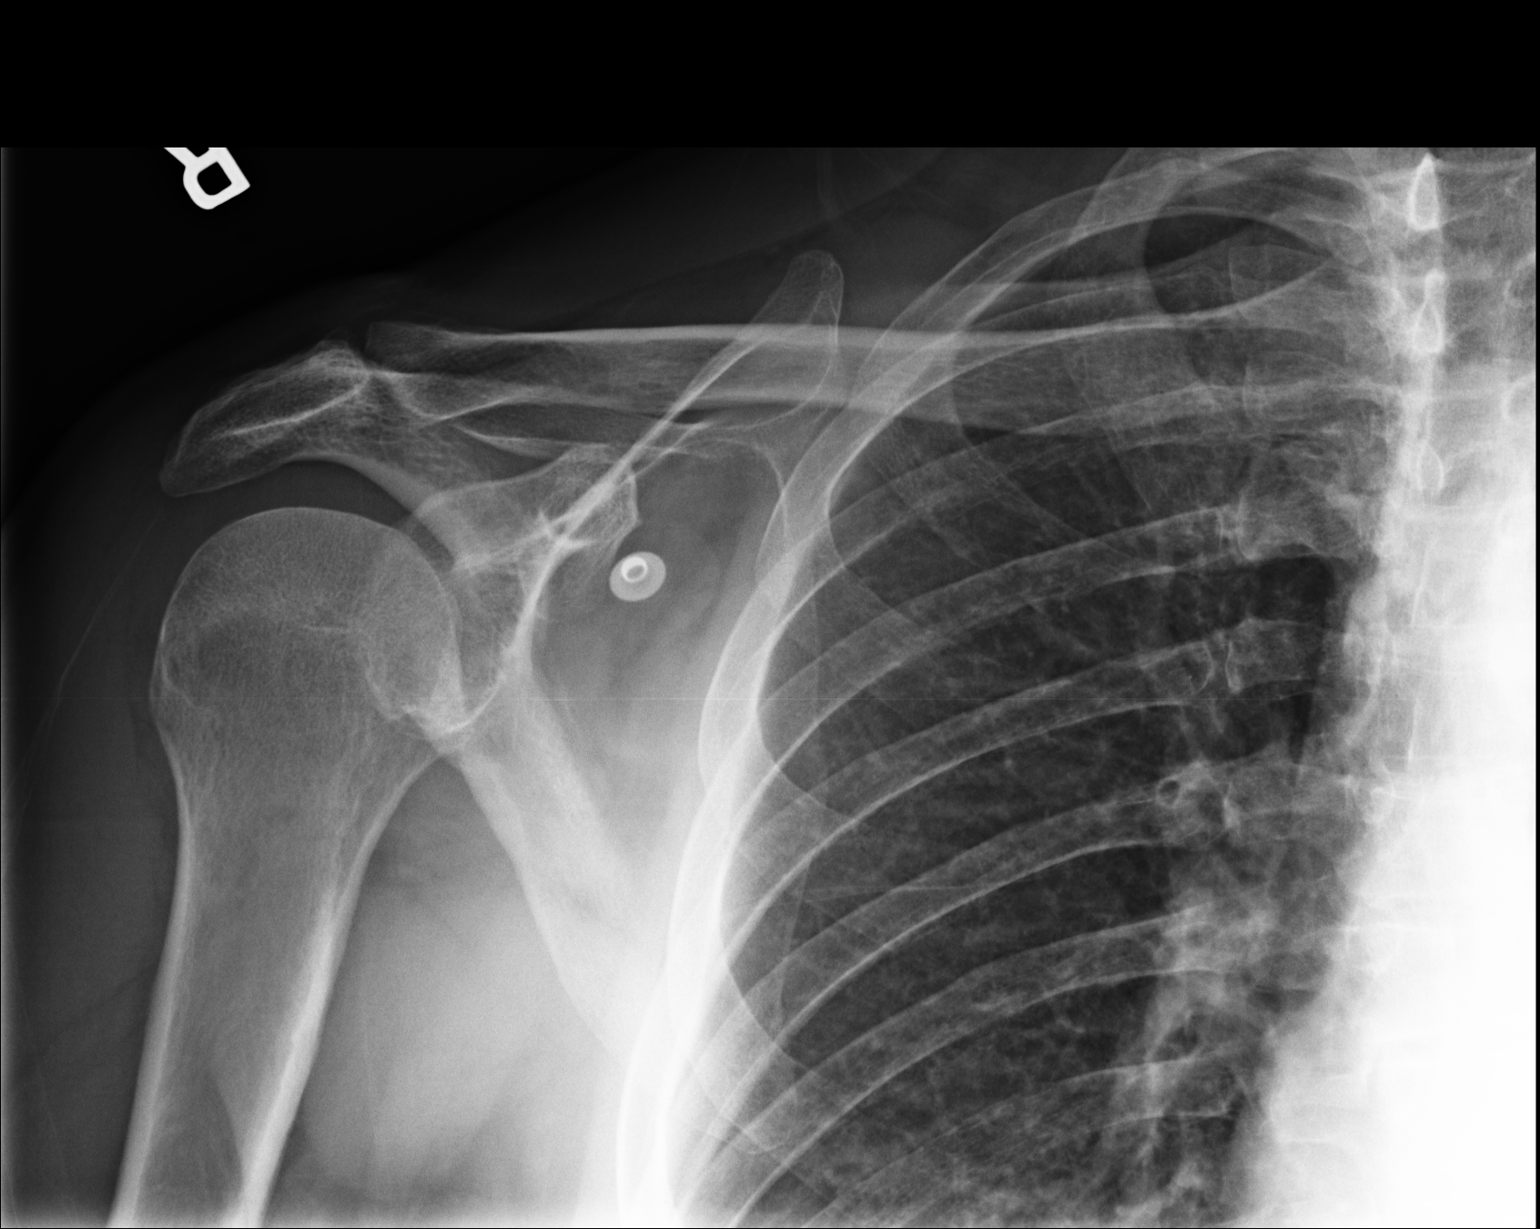
[im 2/3]
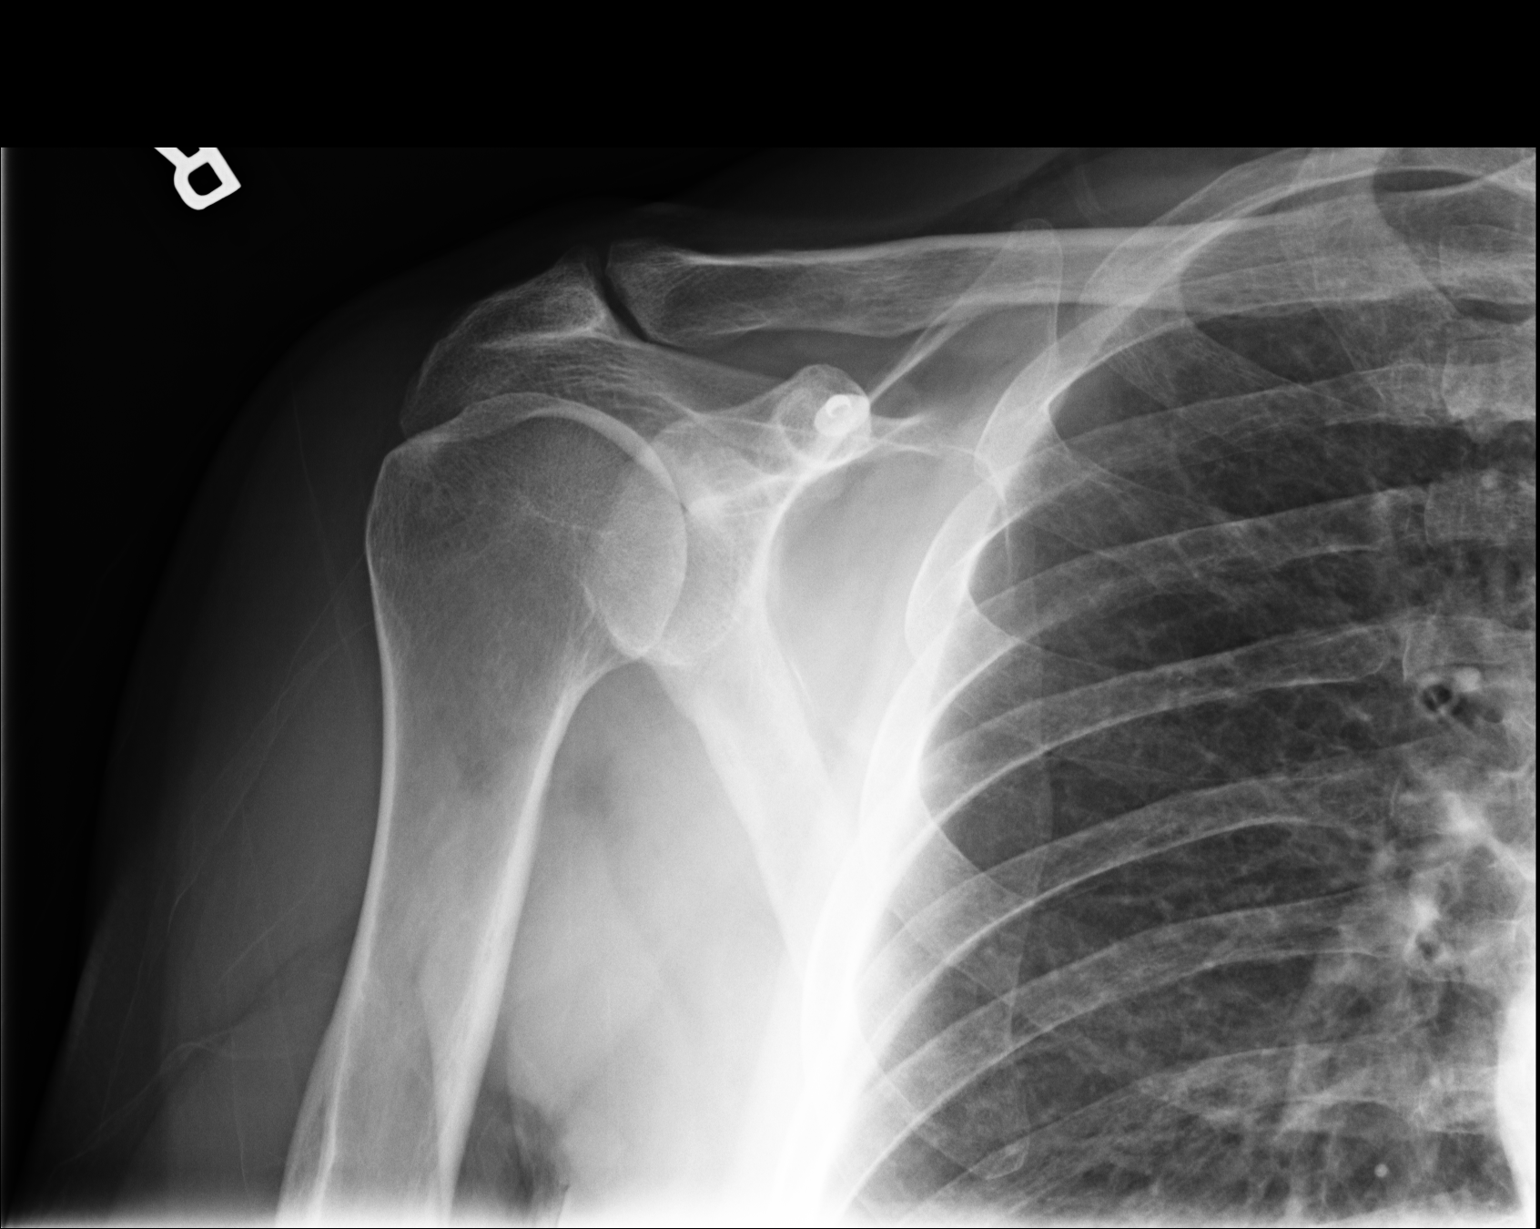
[im 3/3]
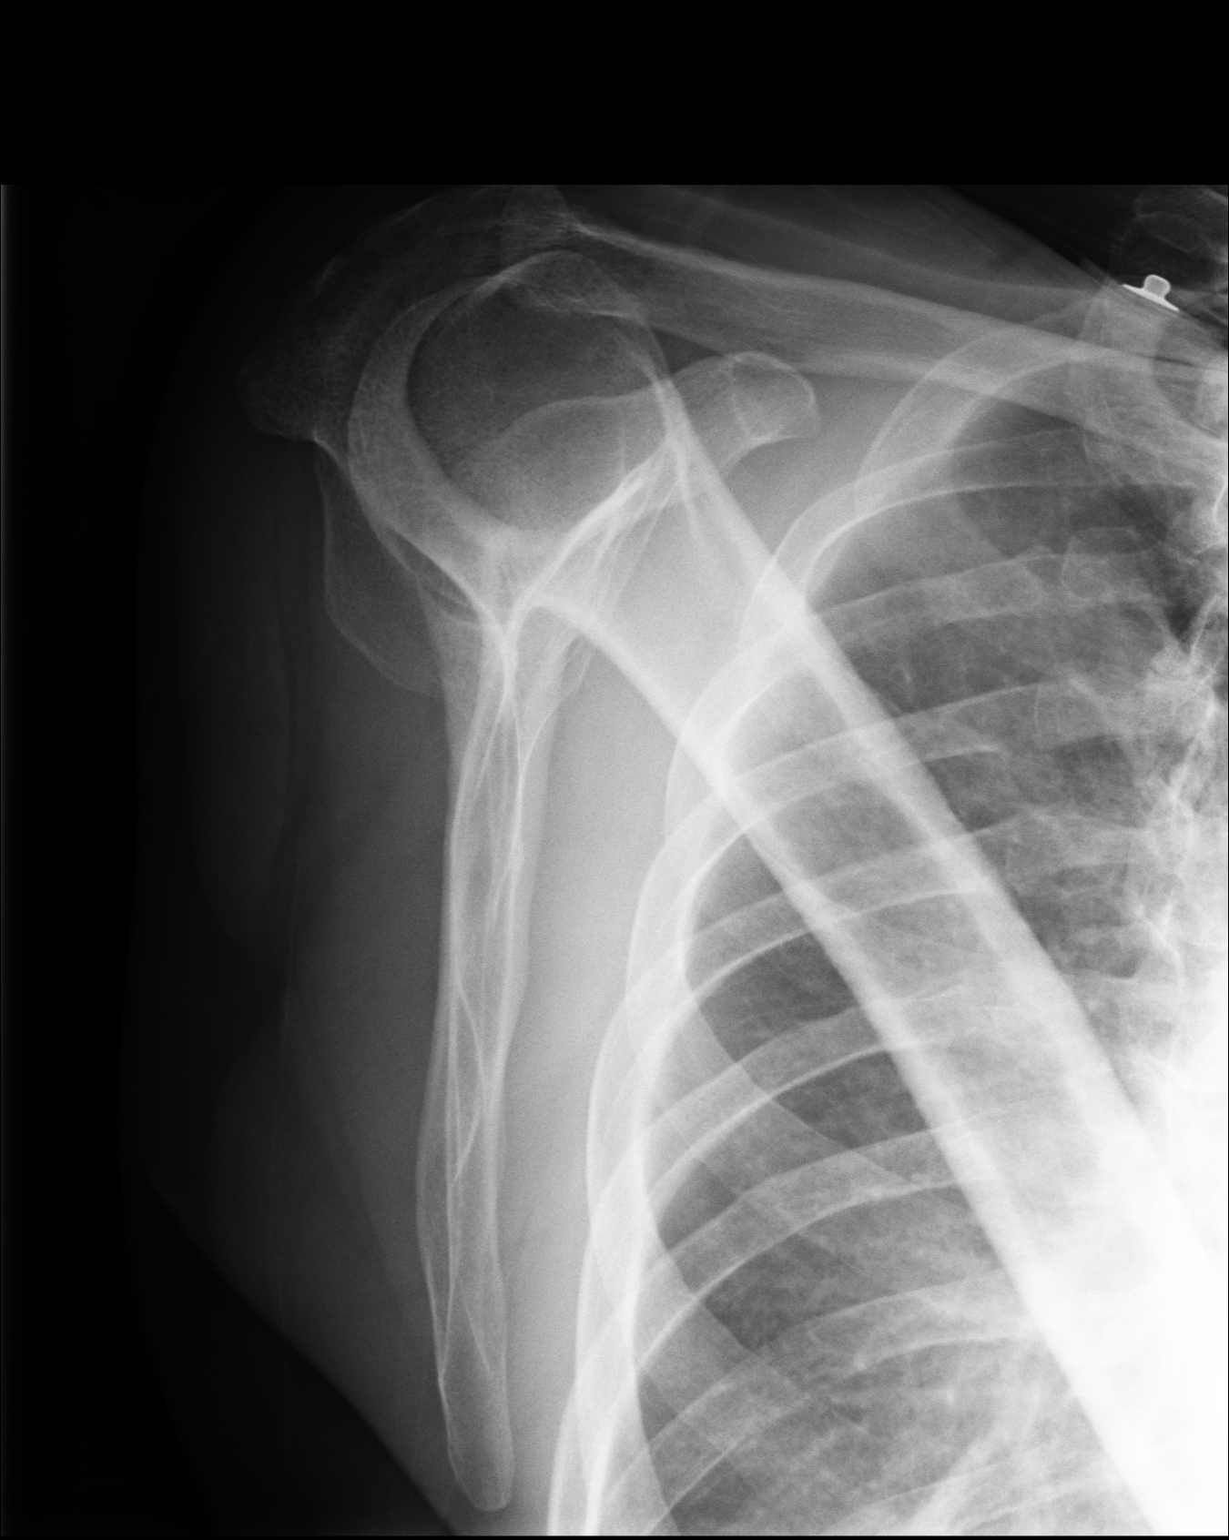

[3 of 3 positions shown; findings below may reference images not displayed]

PROCEDURE:     DXR - DXR SHOULDER RIGHT COMPLETE  - April 28, 2009  [DATE]

RESULT:     Three views of the right shoulder reveal the bones to be
adequately mineralized. I do not see evidence of an acute fracture. The
overlying soft tissues are normal in appearance. There is no evidence of
dislocation.
IMPRESSION: I see no acute bony abnormality of the right shoulder.

## 2010-12-28 NOTE — Medication Information (Signed)
Summary: PT/INR ROV/MES  Anticoagulant Therapy  Managed by: Cloyde Reams, RN, BSN Referring MD: Dr Shirlee Latch PCP: None Supervising MD: Mariah Milling Indication 1: Mitral Valve Disorder (ICD-424.0) Lab Used: [facility] Site: [place] INR POC 2.3 INR RANGE 2.0-3.0  Dietary changes: no    Health status changes: no    Bleeding/hemorrhagic complications: no    Recent/future hospitalizations: no    Any changes in medication regimen? no    Recent/future dental: no  Any missed doses?: no       Is patient compliant with meds? yes       Allergies: No Known Drug Allergies  Anticoagulation Management History:      The patient is taking warfarin and comes in today for a routine follow up visit.  Negative risk factors for bleeding include an age less than 59 years old.  The bleeding index is 'low risk'.  Positive CHADS2 values include History of HTN.  Negative CHADS2 values include Age > 48 years old.  The start date was 05/05/2009.  His last INR was 1.36.  Anticoagulation responsible provider: gollan.  INR POC: 2.3.  Cuvette Lot#: [id number].  Exp: 10/2011.    Anticoagulation Management Assessment/Plan:      The patient's current anticoagulation dose is Warfarin sodium 5 mg tabs: Use as directed by Anticoagulation Clinic.  The target INR is 2.0-3.0.  The next INR is due 01/17/2011.  Anticoagulation instructions were given to patient.  Results were reviewed/authorized by Cloyde Reams, RN, BSN.  He was notified by Cloyde Reams RN.         Prior Anticoagulation Instructions: INR 2.7  Continue same dosage 1 tablet every day except 1/2 tablet on Sundays, Tuesdays, and Thursdays. Recheck in 4 weeks.  Current Anticoagulation Instructions: INR 2.3  Continue on same dosage 1 tablets daily except 1/2 tablet on Sundays, Tuesdays, and Thursdays. Recheck in 4 weeks.

## 2011-01-02 IMAGING — CR DG CHEST 1V PORT
1 series · 1 of 1 positions shown · non-contrast
Comparison: 05/05/2009.

CLINICAL DATA: Mitral valve replacement/CABG.

PORTABLE CHEST - 1 VIEW

[AP]
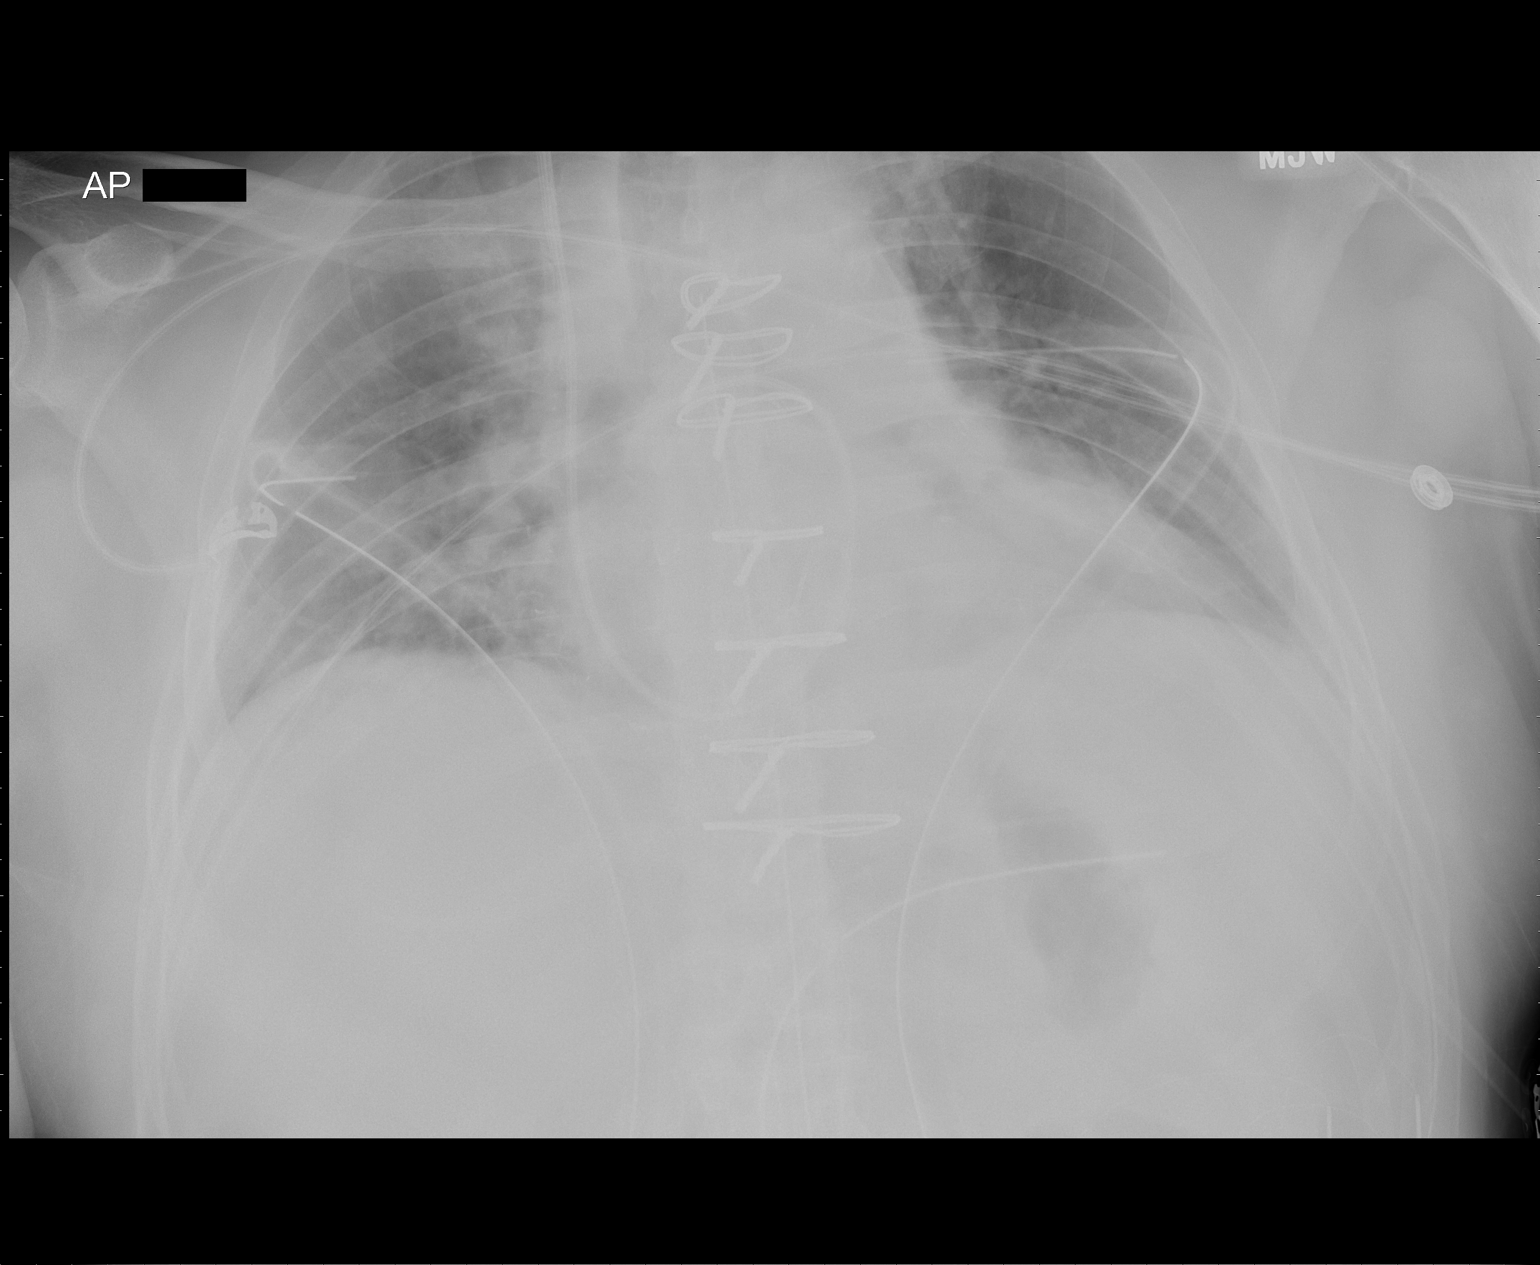

[1 of 1 positions shown; findings below may reference images not displayed]

FINDINGS: Swan-Ganz catheter is in the right pulmonary artery.
There are bilateral chest tubes in place without pneumothorax.
Endoscope has been removed since the prior study.

There is decreased lung volume with bibasilar atelectasis.  There
is vascular congestion and mild edema.
IMPRESSION: Hypoventilation with bibasilar atelectasis.

Progression of vascular congestion and edema.

## 2011-01-09 ENCOUNTER — Encounter: Payer: Self-pay | Admitting: Cardiology

## 2011-01-09 DIAGNOSIS — I513 Intracardiac thrombosis, not elsewhere classified: Secondary | ICD-10-CM | POA: Insufficient documentation

## 2011-01-09 DIAGNOSIS — I4891 Unspecified atrial fibrillation: Secondary | ICD-10-CM

## 2011-01-09 DIAGNOSIS — Z7901 Long term (current) use of anticoagulants: Secondary | ICD-10-CM | POA: Insufficient documentation

## 2011-01-17 ENCOUNTER — Ambulatory Visit (INDEPENDENT_AMBULATORY_CARE_PROVIDER_SITE_OTHER): Payer: Self-pay | Admitting: Emergency Medicine

## 2011-01-17 DIAGNOSIS — I219 Acute myocardial infarction, unspecified: Secondary | ICD-10-CM

## 2011-01-17 DIAGNOSIS — I513 Intracardiac thrombosis, not elsewhere classified: Secondary | ICD-10-CM

## 2011-01-17 DIAGNOSIS — Z7901 Long term (current) use of anticoagulants: Secondary | ICD-10-CM

## 2011-01-17 DIAGNOSIS — I4891 Unspecified atrial fibrillation: Secondary | ICD-10-CM

## 2011-01-17 NOTE — Patient Instructions (Signed)
Continue on same dosage 1 tablet daily except 1/2 tablet on Sundays, Tuesdays, and Thursdays.  Recheck in 4 weeks. 

## 2011-01-28 LAB — POCT I-STAT, CHEM 8
Chloride: 100 mEq/L (ref 96–112)
Creatinine, Ser: 0.9 mg/dL (ref 0.4–1.5)
Glucose, Bld: 128 mg/dL — ABNORMAL HIGH (ref 70–99)
Potassium: 4.2 mEq/L (ref 3.5–5.1)

## 2011-01-28 LAB — CBC
HCT: 28.7 % — ABNORMAL LOW (ref 39.0–52.0)
HCT: 30.6 % — ABNORMAL LOW (ref 39.0–52.0)
HCT: 31.2 % — ABNORMAL LOW (ref 39.0–52.0)
HCT: 32.7 % — ABNORMAL LOW (ref 39.0–52.0)
HCT: 33.5 % — ABNORMAL LOW (ref 39.0–52.0)
HCT: 47.7 % (ref 39.0–52.0)
HCT: 48.6 % (ref 39.0–52.0)
Hemoglobin: 10 g/dL — ABNORMAL LOW (ref 13.0–17.0)
Hemoglobin: 10.9 g/dL — ABNORMAL LOW (ref 13.0–17.0)
Hemoglobin: 11 g/dL — ABNORMAL LOW (ref 13.0–17.0)
Hemoglobin: 11.4 g/dL — ABNORMAL LOW (ref 13.0–17.0)
Hemoglobin: 17.1 g/dL — ABNORMAL HIGH (ref 13.0–17.0)
Hemoglobin: 17.1 g/dL — ABNORMAL HIGH (ref 13.0–17.0)
MCHC: 34.7 g/dL (ref 30.0–36.0)
MCHC: 34.8 g/dL (ref 30.0–36.0)
MCHC: 34.9 g/dL (ref 30.0–36.0)
MCHC: 35.4 g/dL (ref 30.0–36.0)
MCV: 89.2 fL (ref 78.0–100.0)
MCV: 89.6 fL (ref 78.0–100.0)
MCV: 89.8 fL (ref 78.0–100.0)
MCV: 90 fL (ref 78.0–100.0)
MCV: 90 fL (ref 78.0–100.0)
MCV: 90.5 fL (ref 78.0–100.0)
MCV: 91.5 fL (ref 78.0–100.0)
Platelets: 156 10*3/uL (ref 150–400)
Platelets: 161 10*3/uL (ref 150–400)
Platelets: 182 10*3/uL (ref 150–400)
Platelets: 243 10*3/uL (ref 150–400)
RBC: 3.18 MIL/uL — ABNORMAL LOW (ref 4.22–5.81)
RBC: 3.64 MIL/uL — ABNORMAL LOW (ref 4.22–5.81)
RBC: 5.37 MIL/uL (ref 4.22–5.81)
RDW: 13.2 % (ref 11.5–15.5)
RDW: 13.4 % (ref 11.5–15.5)
RDW: 13.4 % (ref 11.5–15.5)
RDW: 13.6 % (ref 11.5–15.5)
WBC: 10.9 10*3/uL — ABNORMAL HIGH (ref 4.0–10.5)
WBC: 11.3 10*3/uL — ABNORMAL HIGH (ref 4.0–10.5)
WBC: 11.4 10*3/uL — ABNORMAL HIGH (ref 4.0–10.5)
WBC: 14.3 10*3/uL — ABNORMAL HIGH (ref 4.0–10.5)
WBC: 9.5 10*3/uL (ref 4.0–10.5)

## 2011-01-28 LAB — BLOOD GAS, ARTERIAL
Acid-Base Excess: 2.5 mmol/L — ABNORMAL HIGH (ref 0.0–2.0)
Bicarbonate: 26.3 mEq/L — ABNORMAL HIGH (ref 20.0–24.0)
FIO2: 0.21 %
O2 Saturation: 97.4 %
Patient temperature: 96.5
pO2, Arterial: 84.5 mmHg (ref 80.0–100.0)

## 2011-01-28 LAB — POCT I-STAT 3, ART BLOOD GAS (G3+)
Acid-base deficit: 1 mmol/L (ref 0.0–2.0)
Acid-base deficit: 1 mmol/L (ref 0.0–2.0)
Acid-base deficit: 1 mmol/L (ref 0.0–2.0)
Bicarbonate: 23.2 mEq/L (ref 20.0–24.0)
Bicarbonate: 23.9 mEq/L (ref 20.0–24.0)
Bicarbonate: 24 mEq/L (ref 20.0–24.0)
Bicarbonate: 24 mEq/L (ref 20.0–24.0)
Bicarbonate: 24.1 mEq/L — ABNORMAL HIGH (ref 20.0–24.0)
Bicarbonate: 24.4 mEq/L — ABNORMAL HIGH (ref 20.0–24.0)
Bicarbonate: 26.1 mEq/L — ABNORMAL HIGH (ref 20.0–24.0)
O2 Saturation: 89 %
O2 Saturation: 92 %
O2 Saturation: 93 %
O2 Saturation: 94 %
Patient temperature: 37.2
Patient temperature: 37.4
Patient temperature: 37.6
TCO2: 24 mmol/L (ref 0–100)
TCO2: 25 mmol/L (ref 0–100)
TCO2: 25 mmol/L (ref 0–100)
TCO2: 26 mmol/L (ref 0–100)
pCO2 arterial: 42.7 mmHg (ref 35.0–45.0)
pCO2 arterial: 48.1 mmHg — ABNORMAL HIGH (ref 35.0–45.0)
pH, Arterial: 7.342 — ABNORMAL LOW (ref 7.350–7.450)
pH, Arterial: 7.357 (ref 7.350–7.450)
pH, Arterial: 7.396 (ref 7.350–7.450)
pO2, Arterial: 222 mmHg — ABNORMAL HIGH (ref 80.0–100.0)
pO2, Arterial: 278 mmHg — ABNORMAL HIGH (ref 80.0–100.0)
pO2, Arterial: 60 mmHg — ABNORMAL LOW (ref 80.0–100.0)
pO2, Arterial: 65 mmHg — ABNORMAL LOW (ref 80.0–100.0)
pO2, Arterial: 69 mmHg — ABNORMAL LOW (ref 80.0–100.0)
pO2, Arterial: 69 mmHg — ABNORMAL LOW (ref 80.0–100.0)
pO2, Arterial: 76 mmHg — ABNORMAL LOW (ref 80.0–100.0)

## 2011-01-28 LAB — BASIC METABOLIC PANEL
BUN: 11 mg/dL (ref 6–23)
BUN: 13 mg/dL (ref 6–23)
BUN: 15 mg/dL (ref 6–23)
BUN: 17 mg/dL (ref 6–23)
BUN: 18 mg/dL (ref 6–23)
CO2: 25 mEq/L (ref 19–32)
CO2: 25 mEq/L (ref 19–32)
CO2: 26 mEq/L (ref 19–32)
CO2: 28 mEq/L (ref 19–32)
Calcium: 8.5 mg/dL (ref 8.4–10.5)
Calcium: 8.6 mg/dL (ref 8.4–10.5)
Calcium: 9.7 mg/dL (ref 8.4–10.5)
Chloride: 101 mEq/L (ref 96–112)
Chloride: 102 mEq/L (ref 96–112)
Chloride: 103 mEq/L (ref 96–112)
Chloride: 106 mEq/L (ref 96–112)
Chloride: 108 mEq/L (ref 96–112)
Chloride: 99 mEq/L (ref 96–112)
Creatinine, Ser: 0.96 mg/dL (ref 0.4–1.5)
Creatinine, Ser: 1 mg/dL (ref 0.4–1.5)
Creatinine, Ser: 1.04 mg/dL (ref 0.4–1.5)
Creatinine, Ser: 1.08 mg/dL (ref 0.4–1.5)
GFR calc Af Amer: >60 mL/min (ref >60)
GFR calc Af Amer: >60 mL/min (ref >60)
GFR calc Af Amer: >60 mL/min (ref >60)
GFR calc Af Amer: >60 mL/min (ref >60)
GFR calc Af Amer: >60 mL/min (ref >60)
GFR calc non Af Amer: >60 mL/min (ref >60)
GFR calc non Af Amer: >60 mL/min (ref >60)
GFR calc non Af Amer: >60 mL/min (ref >60)
Glucose, Bld: 100 mg/dL — ABNORMAL HIGH (ref 70–99)
Glucose, Bld: 101 mg/dL — ABNORMAL HIGH (ref 70–99)
Glucose, Bld: 130 mg/dL — ABNORMAL HIGH (ref 70–99)
Glucose, Bld: 97 mg/dL (ref 70–99)
Potassium: 3.8 mEq/L (ref 3.5–5.1)
Potassium: 3.9 mEq/L (ref 3.5–5.1)
Potassium: 3.9 mEq/L (ref 3.5–5.1)
Potassium: 4 mEq/L (ref 3.5–5.1)
Potassium: 4.2 mEq/L (ref 3.5–5.1)
Potassium: 4.3 mEq/L (ref 3.5–5.1)
Sodium: 133 mEq/L — ABNORMAL LOW (ref 135–145)
Sodium: 135 mEq/L (ref 135–145)
Sodium: 136 mEq/L (ref 135–145)
Sodium: 137 mEq/L (ref 135–145)
Sodium: 138 mEq/L (ref 135–145)
Sodium: 139 mEq/L (ref 135–145)

## 2011-01-28 LAB — POCT I-STAT 4, (NA,K, GLUC, HGB,HCT)
Glucose, Bld: 114 mg/dL — ABNORMAL HIGH (ref 70–99)
Glucose, Bld: 124 mg/dL — ABNORMAL HIGH (ref 70–99)
Glucose, Bld: 99 mg/dL (ref 70–99)
HCT: 27 % — ABNORMAL LOW (ref 39.0–52.0)
HCT: 42 % (ref 39.0–52.0)
HCT: 43 % (ref 39.0–52.0)
Hemoglobin: 14.6 g/dL (ref 13.0–17.0)
Hemoglobin: 8.8 g/dL — ABNORMAL LOW (ref 13.0–17.0)
Hemoglobin: 9.9 g/dL — ABNORMAL LOW (ref 13.0–17.0)
Potassium: 3.9 mEq/L (ref 3.5–5.1)
Potassium: 4.2 mEq/L (ref 3.5–5.1)
Potassium: 4.6 mEq/L (ref 3.5–5.1)
Potassium: 6.1 mEq/L — ABNORMAL HIGH (ref 3.5–5.1)
Sodium: 132 mEq/L — ABNORMAL LOW (ref 135–145)
Sodium: 134 mEq/L — ABNORMAL LOW (ref 135–145)
Sodium: 136 mEq/L (ref 135–145)

## 2011-01-28 LAB — HEMOGLOBIN A1C
Hgb A1c MFr Bld: 5.7 % (ref 4.6–6.1)
Mean Plasma Glucose: 117 mg/dL

## 2011-01-28 LAB — TYPE AND SCREEN

## 2011-01-28 LAB — DIFFERENTIAL
Eosinophils Absolute: 0.1 10*3/uL (ref 0.0–0.7)
Eosinophils Relative: 1 % (ref 0–5)
Lymphocytes Relative: 18 % (ref 12–46)
Lymphs Abs: 1.7 10*3/uL (ref 0.7–4.0)
Monocytes Absolute: 0.7 10*3/uL (ref 0.1–1.0)
Monocytes Relative: 7 % (ref 3–12)

## 2011-01-28 LAB — COMPREHENSIVE METABOLIC PANEL
ALT: 42 U/L (ref 0–53)
Alkaline Phosphatase: 69 U/L (ref 39–117)
BUN: 19 mg/dL (ref 6–23)
CO2: 25 mEq/L (ref 19–32)
GFR calc non Af Amer: >60 mL/min (ref >60)
Glucose, Bld: 97 mg/dL (ref 70–99)
Potassium: 4.4 mEq/L (ref 3.5–5.1)
Sodium: 136 mEq/L (ref 135–145)
Total Bilirubin: 0.7 mg/dL (ref 0.3–1.2)
Total Protein: 7.5 g/dL (ref 6.0–8.3)

## 2011-01-28 LAB — HEMOGLOBIN AND HEMATOCRIT, BLOOD: HCT: 31.1 % — ABNORMAL LOW (ref 39.0–52.0)

## 2011-01-28 LAB — PROTIME-INR
INR: 1.2 (ref 0.00–1.49)
INR: 1.3 (ref 0.00–1.49)
Prothrombin Time: 16 seconds — ABNORMAL HIGH (ref 11.6–15.2)
Prothrombin Time: 16.9 seconds — ABNORMAL HIGH (ref 11.6–15.2)

## 2011-01-28 LAB — URINALYSIS, ROUTINE W REFLEX MICROSCOPIC
Bilirubin Urine: NEGATIVE
Nitrite: NEGATIVE
Specific Gravity, Urine: 1.025 (ref 1.005–1.030)
Urobilinogen, UA: 0.2 mg/dL (ref 0.0–1.0)
pH: 5.5 (ref 5.0–8.0)

## 2011-01-28 LAB — PLATELET COUNT: Platelets: 171 10*3/uL (ref 150–400)

## 2011-01-28 LAB — ABO/RH: ABO/RH(D): O POS

## 2011-01-28 LAB — MRSA PCR SCREENING: MRSA by PCR: NEGATIVE

## 2011-01-28 LAB — PREPARE PLATELETS

## 2011-01-28 LAB — LIPID PANEL
HDL: 36 mg/dL — ABNORMAL LOW (ref >39)
HDL: 39 mg/dL — ABNORMAL LOW (ref >39)
LDL Cholesterol: 108 mg/dL — ABNORMAL HIGH (ref 0–99)
Total CHOL/HDL Ratio: 5.8 RATIO
Triglycerides: 202 mg/dL — ABNORMAL HIGH (ref <150)
VLDL: 40 mg/dL (ref 0–40)
VLDL: 57 mg/dL — ABNORMAL HIGH (ref 0–40)

## 2011-01-28 LAB — GLUCOSE, CAPILLARY
Glucose-Capillary: 132 mg/dL — ABNORMAL HIGH (ref 70–99)
Glucose-Capillary: 135 mg/dL — ABNORMAL HIGH (ref 70–99)

## 2011-02-14 ENCOUNTER — Encounter: Payer: Self-pay | Admitting: Emergency Medicine

## 2011-02-19 ENCOUNTER — Ambulatory Visit (INDEPENDENT_AMBULATORY_CARE_PROVIDER_SITE_OTHER): Payer: Self-pay | Admitting: *Deleted

## 2011-02-19 ENCOUNTER — Encounter (INDEPENDENT_AMBULATORY_CARE_PROVIDER_SITE_OTHER): Payer: Self-pay

## 2011-02-19 DIAGNOSIS — I513 Intracardiac thrombosis, not elsewhere classified: Secondary | ICD-10-CM

## 2011-02-19 DIAGNOSIS — Z7901 Long term (current) use of anticoagulants: Secondary | ICD-10-CM

## 2011-02-19 DIAGNOSIS — R0989 Other specified symptoms and signs involving the circulatory and respiratory systems: Secondary | ICD-10-CM

## 2011-02-19 DIAGNOSIS — I4891 Unspecified atrial fibrillation: Secondary | ICD-10-CM

## 2011-02-19 DIAGNOSIS — I428 Other cardiomyopathies: Secondary | ICD-10-CM

## 2011-02-19 DIAGNOSIS — I219 Acute myocardial infarction, unspecified: Secondary | ICD-10-CM

## 2011-02-19 LAB — POCT INR: INR: 3.1

## 2011-03-01 ENCOUNTER — Encounter: Payer: Self-pay | Admitting: Cardiology

## 2011-03-01 ENCOUNTER — Ambulatory Visit (INDEPENDENT_AMBULATORY_CARE_PROVIDER_SITE_OTHER): Payer: Self-pay | Admitting: Cardiology

## 2011-03-01 DIAGNOSIS — I2589 Other forms of chronic ischemic heart disease: Secondary | ICD-10-CM

## 2011-03-01 DIAGNOSIS — Z79899 Other long term (current) drug therapy: Secondary | ICD-10-CM

## 2011-03-01 DIAGNOSIS — I513 Intracardiac thrombosis, not elsewhere classified: Secondary | ICD-10-CM

## 2011-03-01 DIAGNOSIS — E785 Hyperlipidemia, unspecified: Secondary | ICD-10-CM

## 2011-03-01 DIAGNOSIS — I25810 Atherosclerosis of coronary artery bypass graft(s) without angina pectoris: Secondary | ICD-10-CM

## 2011-03-01 DIAGNOSIS — I5022 Chronic systolic (congestive) heart failure: Secondary | ICD-10-CM

## 2011-03-01 LAB — LIPID PANEL
LDL Cholesterol: 60 mg/dL (ref 0–99)
VLDL: 40 mg/dL (ref 0–40)

## 2011-03-01 LAB — BASIC METABOLIC PANEL
Chloride: 102 mEq/L (ref 96–112)
Potassium: 4.1 mEq/L (ref 3.5–5.3)
Sodium: 135 mEq/L (ref 135–145)

## 2011-03-01 LAB — HEPATIC FUNCTION PANEL
ALT: 24 U/L (ref 0–53)
AST: 20 U/L (ref 0–37)
Bilirubin, Direct: 0.1 mg/dL (ref 0.0–0.3)
Indirect Bilirubin: 0.4 mg/dL (ref 0.0–0.9)

## 2011-03-01 MED ORDER — CARVEDILOL 25 MG PO TABS: 25.0000 mg | ORAL_TABLET | Freq: Two times a day (BID) | ORAL | Status: DC

## 2011-03-01 NOTE — Assessment & Plan Note (Signed)
No ischemic symptoms.  Will draw lipids/LFTs,  goal LDL < 70.  Continue ASA, statin, ACEI, beta blocker.

## 2011-03-01 NOTE — Progress Notes (Signed)
50 yo with h/o CAD s/p CABG and ischemic cardiomyopathy as well as LV apical thrombus returns for followup.  He is doing quite well with minimal symptoms.  No chest pain. No exertional dyspnea.   He is tolerating all his meds. [place].  Echo from 6/11 showed EF 40% (improved) with dyskinetic apex.    ECG: NSR, old inferior and anterior MIs with inferior and anterolateral T wave inversions  Labs (12/10): creatinine 0.85 Labs (4/11): K 4.5, creatinine 0.87, LDL 71, HDL 36 Labs (11/11): LDL 83, HDL 34  Allergies (verified):  No Known Drug Allergies  Family History: Father with MI at 60 Mother with CHF Hyperlipidemia runs in family  Social History: Lives with wife in [place].  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  Originally from [place].  Smoked 1 ppd x years, quit 7/10.  Occasional ETOH.  No drugs.   Past Medical History: 1.  Hyperlipidemia 2.  Smoking: quit 7/10 3.  CAD:  LHC (7/10) with EF 35%, RCA totally occluded, ostial LAD totally occluded, mid CFX totally occluded.  Large OM1 with collaterals to the LAD and RCA.  CABG with LIMA-LAD, RIMA-RCA.   4.  Moderate MR: MV repair/annuloplasty ring at time of CABG (7/10). 5.  Ischemic CMP: echo (7/10) with mild LV dilatation, EF 25-30%.  Anteroseptal, inferoseptal, inferior, and apical akinesis.  RV normal.  Moderate MR.  Repeat echo (10/10) EF 20%, mild to moderately dilated, dyskinetic apex, akinetic septum, severe hypokinesis of the inferior and anterior walls. Mild MR.  LV apical thrombus.  Repeat echo (6/11): EF 40%, moderately dilated LV, mild LVH, apical anterior wall/apical septal wall/apex dyskinetic, s/p MV repair with no MS or MR. LV apical thrombus not seen on this echo.  6.  Atrial fibrillation: post-op CABG.  7.  LV apical thrombus: on coumadin.  8.  [employer] dual chamber AICD (11/10)  Current Outpatient  Prescriptions  Medication Sig Dispense Refill  . atorvastatin (LIPITOR) 80 MG tablet Take 80 mg by mouth daily.        . enalapril (VASOTEC) 10 MG tablet Take 10 mg by mouth 2 (two) times daily.        . fish oil-omega-3 fatty acids 1000 MG capsule Take 2,400 mg by mouth.        . nitroGLYCERIN (NITROSTAT) 0.4 MG SL tablet Place 0.4 mg under the tongue every 5 (five) minutes as needed.        Marland Kitchen spironolactone (ALDACTONE) 25 MG tablet Take 25 mg by mouth daily.        Marland Kitchen warfarin (COUMADIN) 5 MG tablet Take by mouth as directed.        Marland Kitchen DISCONTD: carvedilol (COREG CR) 10 MG 24 hr capsule Take 10 mg by mouth 2 (two) times daily.        Marland Kitchen DISCONTD: spironolactone (ALDACTONE) 12.5 mg TABS Take 12.5 mg by mouth once.        Marland Kitchen aspirin 81 MG EC tablet Take 81 mg by mouth daily.        . carvedilol (COREG) 25 MG tablet Take 1 tablet (25 mg total) by mouth 2 (two) times daily.  60 tablet  6    BP 104/70  Pulse 73  Ht 5\' 10"  (1.778 m)  Wt 205 lb 12.8 oz (93.35 kg)  BMI 29.53 kg/m2 General:  Well developed, well nourished, in no  acute distress. Neck:  Neck supple, no JVD. No masses, thyromegaly or abnormal cervical nodes. Lungs:  Clear bilaterally to auscultation and percussion. Heart:  Non-displaced PMI, chest non-tender; regular rate and rhythm, S1, S2 without rubs or gallops.  No murmur.  Carotid upstroke normal, no bruit. Pedals normal pulses. No edema, no varicosities. Abdomen:  Bowel sounds positive; abdomen soft and non-tender without masses, organomegaly, or hernias noted. No hepatosplenomegaly. Extremities:  No clubbing or cyanosis. Neurologic:  Alert and oriented x 3. Psych:  Normal affect.

## 2011-03-01 NOTE — Patient Instructions (Signed)
Follow-up in 6 months  Continue same medications

## 2011-03-01 NOTE — Assessment & Plan Note (Signed)
Not seen on last echo.  Continue coumadin for now.

## 2011-03-01 NOTE — Assessment & Plan Note (Addendum)
NYHA class I symptoms at this time.  He is doing very well and is active.  Continue current doses of carvedilol, enalapril and spironolactone.  EF improved to 40% on last echo.  He has an ICD.  Check BMET today.

## 2011-03-06 ENCOUNTER — Encounter: Payer: Self-pay | Admitting: *Deleted

## 2011-03-06 NOTE — Consult Note (Signed)
[name] [name], [name] [name] NO.:  [id number]   MEDICAL RECORD NO.:  [id number]          PATIENT TYPE:  INP   LOCATION:  2922                         FACILITY:  [facility]   PHYSICIAN:  Evelene Croon, M.D.     DATE OF BIRTH:  [birth year]   DATE OF CONSULTATION:  05/02/2009  DATE OF DISCHARGE:                                 CONSULTATION   REFERRING PHYSICIAN:  Marca Ancona, MD   REASON FOR CONSULTATION:  Severe three-vessel coronary artery disease.   CLINICAL HISTORY:  I was asked by Dr. Shirlee Latch to evaluate [name] [name] for  consideration of coronary artery bypass graft surgery.  He is a 50-year-  old white male smoker with hypercholesterolemia who reports having an  episode of mild exertional substernal chest pain about 2 weeks ago while  replacing engine in his truck.  This was associated with heavy exertion.  It was relieved quickly with rest.  He had no further episodes.  [place] said that he fell like he strained his  right shoulder, which he has had problems in the past with.  He went to  the emergency room on April 28, 2009 to have a right shoulder x-ray and an  electrocardiogram was performed, which showed anterior inferior Q-waves  suggesting prior myocardial infarction.  His cardiac enzymes were  negative.  He was referred to Cardiology for further evaluation.  An  elective cardiac catheterization was performed today, which showed  proximal LAD occlusion with the distal vessel filling by collaterals  from a large obtuse marginal branch.  The right coronary artery was  occluded proximally with bridging collaterals filling the midportion of  the vessel.  The midportion was then occluded after the acute marginal  with filling of the distal vessel by collaterals from the obtuse  marginal.  The left circumflex was occluded after the first marginal  with a small distal vessel barely filling.  Left ventricle ejection  fraction was estimated at  30-35% by hand injection, which was done due  to increased LVEDP.  It was not possible to assess the degree of mitral  regurgitation.   REVIEW OF SYSTEMS:  GENERAL:  He denies any fever or chills.  He has had  no recent weight changes.  He denies fatigue.  EYES:  Negative.  ENT:  Negative.  ENDOCRINE:  Denies diabetes and hypothyroidism.  CARDIOVASCULAR:  As above.  He has had no exertional dyspnea.  Denies  PND and orthopnea.  Denies peripheral edema and palpitations.  RESPIRATORY:  Denies cough and sputum production.  GI:  He has had no  nausea or vomiting.  Denies melena and bright red blood per rectum.  GU:  Denies dysuria and hematuria.  MUSCULOSKELETAL:  Denies arthralgias and  myalgias.  NEUROLOGIC:  He denies any focal weakness and numbness.  Denies dizziness and syncope.  He has never had a TIA or stroke.  ALLERGIES:  None.  PSYCHIATRIC:  Negative.  HEMATOLOGIC:  Negative.   MEDICATIONS PRIOR TO ADMISSION:  Occasional Goody's Extra Strength  500/325/65 mg.   PAST  MEDICAL HISTORY:  Significant for hyperlipidemia.  He has had  previous surgery on his knee due to torn ligaments related to physical  activity.  He does have right rotator cuff injury.   SOCIAL HISTORY:  He lives in [place] with his wife.  He is a  Radiation protection practitioner for [employer].  He smokes 1-pack per day since age 36.  He drinks occasional alcohol and denies any drug use.   PHYSICAL EXAMINATION:  VITAL SIGNS:  Blood pressure is 135/70 and his  pulse is 80 and regular.  Respiratory rate is 16 and unlabored.  GENERAL:  He is a well-developed white male in no distress.  HEENT:  Normocephalic and atraumatic.  Pupils are equal and reactive to  light and accommodation.  Extraocular muscles are intact.  His throat is  clear.  NECK:  Normal carotid pulses bilaterally.  There were no bruits.  There  was no adenopathy or thyromegaly.  CARDIAC:  Regular rate and rhythm with normal S1 and S2.  There is a 1/6  systolic  murmur at the apex.  LUNGS:  Clear.  ABDOMEN:  Active bowel sounds.  His abdomen is soft and nontender.  There were no palpable masses or organomegaly.  EXTREMITIES:  No peripheral edema.  Pedal pulses are palpable  bilaterally.  SKIN:  Warm and dry.  NEUROLOGIC:  Alert and oriented x3.  Motor and sensory exams are grossly  normal.   IMPRESSION:  Mr. Imbert has severe three-vessel coronary artery disease  with moderate left ventricular dysfunction with completely occluded left  anterior descending and right coronary arteries with collaterals from an  obtuse marginal.  I agree that coronary artery bypass graft surgery is  the best treatment to prevent further ischemia and infarction.  He does  have a faint systolic murmur.  He is to have a 2-D echocardiogram to  evaluate his cardiac valves.  I discussed the operative procedure of  coronary artery bypass graft surgery with the patient including  alternatives, benefits, and risks including, but not limited to  bleeding, blood transfusion, infection, stroke, myocardial infarction,  graft failure, and death.  He understands and would like to proceed with  surgery.  We will plan to do surgery on Thursday of this week.      Evelene Croon, M.D.  Electronically Signed     BB/MEDQ  D:  05/02/2009  T:  05/03/2009  Job:  [id number]   cc:   Marca Ancona, MD  [name], M.D.

## 2011-03-06 NOTE — Assessment & Plan Note (Signed)
OFFICE VISIT   [name], [name]  DOB:  07-Jan-1961                                        June 07, 2009  CHART #:  [id number]   The patient returned to my office today for followup status post  coronary artery bypass graft surgery x2 using bilateral internal mammary  artery grafts and mitral valve repair with a 28-mm annuloplasty ring on  May 05, 2009.  He had severe left ventricular dysfunction  preoperatively with severe left ventricular dilatation and ejection  fraction estimated at 25% in the operating room.  He had akinesis of the  anterior wall and apex as well as inferior wall.  There is moderate  mitral regurgitation.  His postoperative course is uncomplicated.  Since  discharge, he said he has been feeling quite well.  He is walking  several times per day at a fast pace and has had no chest pain or  shortness of breath.  He is attending cardiac rehab but said he is  actually exercising much harder on his own.  He currently has no  complaints.   PHYSICAL EXAMINATION:  Vital Signs:  Blood pressure is 126/81, pulse is  76 and regular, and his respiratory rate is 18, unlabored.  Oxygen  saturation on room air is 98%.  General:  He looks well.  Cardiac:  Regular rate and rhythm with normal heart sounds.  There was no murmur.  Lungs:  Clear.  The chest incision is healing well and the sternum is  stable.  There is no peripheral edema.   A followup chest x-ray shows clear lung fields and no pleural effusions.   MEDICATIONS:  Coumadin 5 mg daily, Coreg 6.25 mg b.i.d., oxycodone  p.r.n. for pain, lisinopril 10 mg daily, Lipitor 80 mg daily.   IMPRESSION:  Overall, the patient is recovering quite well following a  surgery.  I encouraged him to continue exercising as he is.  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  He  has been maintained on  Coumadin for his mitral valve repair and can be  switched to aspirin alone after 3 months.  He will continue to follow up  with Dr. Marca Ancona and Dr. Shirlee Latch can  decide when to convert him from Coumadin to aspirin.  He will return to  see me if he has any problems with his incision.   Evelene Croon, M.D.  Electronically Signed   BB/MEDQ  D:  06/07/2009  T:  06/08/2009  Job:  [id number]   cc:   Marca Ancona, MD

## 2011-03-06 NOTE — Cardiovascular Report (Signed)
NAME:  CARLTON, BUSKEY NO.:  [id number]   MEDICAL RECORD NO.:  [id number]          PATIENT TYPE:  INP   LOCATION:  2922                         FACILITY:  [facility]   PHYSICIAN:  Marca Ancona, MD      DATE OF BIRTH:  Apr 19, 1961   DATE OF PROCEDURE:  DATE OF DISCHARGE:                            CARDIAC CATHETERIZATION   PROCEDURE:  1. Left heart catheterization.  2. Coronary angiography.  3. Left ventriculography.   INDICATION:  This is a 50 year old with a history of hyperlipidemia and  smoking who came to the Cardiology Clinic about 10 days after an episode  of mild chest pressure with exertion.  He was noted to have anterior and  inferior Q waves.   PROCEDURE NOTE:  After informed consent was obtained, the right groin  was sterilely prepped and draped.  A 1% Lidocaine was used to locally  anesthetize the right groin area.  A 5-French arterial sheath was placed  in the right common femoral artery using Seldinger technique.  The left  coronary artery was engaged using the 5-French multipurpose catheter.  The ventricle was entered using the 5-French multipurpose catheter, and  the right coronary artery was engaged using the 5-French JR4 catheter.  There were no complications.   FINDINGS:  1. Hemodynamics:  LV was 123/32 and aorta 125/73.  2. Left ventriculography:  We did not do a power injection and only      did a hand ventriculogram with about 7 mL of contrast due to the      patient's significantly elevated left ventricular end-diastolic      pressure.  We were unable to assess mitral regurgitation.  His EF      was moderately decreased and roughly estimated to be around 35%.  3. Right coronary artery:  The right coronary was the dominant vessel.      It was totally occluded proximally.  There were bridging      collaterals from the proximal to mid section of the RCA and then      the mid RCA was again totally occluded.  The distal RCA filled by      left  to right collaterals from the large first obtuse marginal.  4. Left main:  Left main coronary artery was widely patent with no      significant disease.  5. LAD system:  The ostial LAD was totally occluded.  The mid to      distal LAD filled by left-to-left collaterals from the large first      obtuse marginal.  6. Left circumflex system:  The left circumflex was totally occluded      in the midvessel just past the take off of the large first obtuse      marginal.  The AV circumflex can be seen as a faint ghost of a      vessel.  There was a large first obtuse marginal that was widely      patent.  It bifurcated into a superior and inferior division.  The      large first obtuse marginal  supplied collaterals to the LAD and to      the PDA.   ASSESSMENT AND PLAN:  This is a 50 year old with history of elevated  cholesterol and smoking who presented to Cardiology Clinic with an  abnormal EKG showing inferior and anterior Qs.  He had had an episode of  mild chest pain 10 days prior associated with exertion.  Left heart  catheterization today shows a totally occluded proximal LAD and a  totally occluded RCA.  The circumflex is totally occluded after the take  off of a large obtuse marginal.  This first obtuse marginal is a large  vessel that provides collaterals to the LAD and the RCA system.  There  do appear to be targets in the mid to distal LAD and the PDA for bypass  grafting.  We did not do a power injection left ventriculogram because  of the patient's elevated LVEDP of 32 mmHg.  We did do a hand  ventriculogram with about 7 mL of contrast, which showed that the LV  systolic function was moderately depressed.  The patient additionally  has a murmur that is suggestive of mitral regurgitation.  The patient  will be given Lasix in the cath lab.  We will  admit him.  We will get a cardiac surgery consultation.  He will also  need an echocardiogram to further assess his LV function and to  assess  for mitral regurgitation.  If he does have moderate or severe mitral  regurgitation, he probably should have his mitral valve repaired at the  time of surgery.      Marca Ancona, MD  Electronically Signed     DM/MEDQ  D:  05/02/2009  T:  05/03/2009  Job:  [id number]   cc:   [name], M.D.

## 2011-03-06 NOTE — Op Note (Signed)
[name] [name], [name] [name] NO.:  [id number]   MEDICAL RECORD NO.:  [id number]          PATIENT TYPE:  INP   LOCATION:  2301                         FACILITY:  [facility]   PHYSICIAN:  Evelene Croon, M.D.     DATE OF BIRTH:  [birth year]   DATE OF PROCEDURE:  05/05/2009  DATE OF DISCHARGE:                               OPERATIVE REPORT   PREOPERATIVE DIAGNOSES:  Severe three-vessel coronary artery disease  with severe left ventricular dysfunction and moderate mitral  regurgitation.   POSTOPERATIVE DIAGNOSES:  Severe three-vessel coronary artery disease  with severe left ventricular dysfunction and moderate mitral  regurgitation.   OPERATIVE PROCEDURE:  Median sternotomy, extracorporeal circulation,  coronary artery bypass graft surgery x2 using a left internal mammary  artery graft to the left anterior descending coronary artery, with a  right internal mammary artery graft to the right coronary artery.  Mitral valve repair with 20-mm annuloplasty ring.   SURGEON:  Evelene Croon, MD   ASSISTANT:  Coral Ceo, PA-C   ANESTHESIA:  General endotracheal.   CLINICAL HISTORY:  This patient is a 50 year old white male smoker with  no prior cardiac history who is very active.  He has had a single  episode of substernal chest pain about 2 weeks prior to admission, which  was brief and related to heavy exertion.  This was relieved with rest.  He had no further episodes for several days and then [place] and [place] house and felt like he had probably  pulled his shoulder.  He went to the emergency room to have it x-rayed  since he had had a prior shoulder injury and an electrocardiogram was  done, which was markedly abnormal showing Q-waves anteriorly and  inferiorly.  Cardiac enzymes were negative.  He was referred to  Cardiology and scheduled for cardiac catheterization, which showed  severe three-vessel disease.  His LAD was occluded proximally with  filling of the distal LAD and diagonal by collaterals from a large  obtuse marginal branch.  His right coronary artery was occluded  proximally with bridging collaterals filling the midportion of the  vessel and then complete occlusion after this with filling of the distal  vessel by collaterals from the obtuse marginal branch.  The distal left  circumflex beyond the obtuse marginal was also occluded and filled  faintly by collaterals, but was a relatively small vessel.  Ejection  fraction by catheterization was about 35% with some mitral regurgitation  that was difficult to estimate since it was a hand injection due to  increased left ventricular end-diastolic pressure and echocardiogram was  done, which showed at least moderate mitral regurgitation, which was  felt to be ischemic in nature with ejection fraction of about 30%-35% at  the most.  He had trace aortic insufficiency.  After review of the  studies and examination of the patient, it was felt that coronary artery  bypass graft surgery and mitral valve repair was the best treatment to  prevent further ischemia and infarction and potentially improve left  ventricular function.  I discussed  the operative procedure with him  including alternatives, benefits, and risks including, but not limited  to bleeding, blood transfusion, infection, stroke, myocardial  infarction, graft failure, heart block requiring permanent pacemaker,  and the possibility that we may need to replace his mitral valve in  which case I would use a mechanical valve of his age.  He understood all  of this and agreed to proceed.   OPERATIVE PROCEDURE:  The patient was taken to the operating room,  placed on table in supine position.  After induction of general  endotracheal anesthesia, a Foley catheter was placed in bladder using  sterile technique.  Preoperative intravenous antibiotics were given.  The chest, abdomen, and both lower extremities were prepped and  draped  in usual sterile manner.  Transesophageal echocardiogram was performed.  This showed severe left ventricular dysfunction with a dilated left  ventricle.  Ejection fraction was about 25% with akinesis of the  anterior wall and apex as well as inferior wall.  Lateral wall was  moving well.  There was moderate mitral regurgitation with the patient  asleep.  There was trace aortic insufficiency and no aortic stenosis.  Right ventricular function appeared well preserved.   The chest was opened through a median sternotomy incision and the  pericardium opened in midline.  Examination of the right ventricle  showed good contractility.  The anterior wall of the left ventricle  appeared akinetic.  The ascending aorta was of normal size and had no  palpable plaques in it.   Then, the left internal mammary artery was harvested from the chest wall  as a pedicle graft.  This was a medium-caliber vessel with excellent  blood flow through it.  This was followed by harvesting of the right  internal mammary artery as a pedicle graft and this was also medium-  caliber vessel with excellent blood flow through it and was long enough  to reach down to the right coronary artery.   The patient was heparinized and when an adequate activated clotting time  was achieved, the distal ascending aorta was cannulated using a 20-  [place] aortic cannula for arterial inflow.  Venous outflow was achieved  using a bicaval venous cannulation with a 24-French metal-tipped right  angle cannula placed through a pursestring suture in the superior vena  cava and a 36-French plastic right-angle cannula placed through a  pursestring suture in the low right atrium.  An antegrade cardioplegia  and vent cannula was inserted in the aortic root.  A retrograde  cardioplegic cannula was inserted through the right atrium and the  coronary sinus.   The patient was placed on cardiopulmonary bypass and the distal  coronaries  identified.  The LAD was a moderate-sized vessel in the  midportion where it was somewhat diseased.  Beyond this disease in the  distal portion, it was smaller but graftable.  There was a moderate-  sized diagonal branch that had minimal disease in it that communicated  with the LAD.  The anterior wall was thinned out and collapsed with the  heart empty.  The apex was likewise thinned out and I suspect that the  majority of the anterior wall and apex were scarred.  The obtuse  marginal was a large vessel with no disease in it.  The distal left  circumflex was small and not graftable.  The right coronary artery was  heavily diseased throughout its proximal and midportions, but distally  was suitable for grafting just before the take off  of the posterior  descending branch.  The inferior wall appeared to have muscle present.   Then, the aorta was crossclamped and 500 mL of cold blood antegrade  cardioplegia was administered in the aortic root with quick arrest of  the heart.  This was followed by 500 mL of cold blood retrograde  cardioplegia.  Additional doses of cold blood retrograde cardioplegia  were given at 20-minute intervals maintaining myocardial temperature  around 10 degrees centigrade.  Systemic hypothermia to 20 degrees  centigrade and topical hypothermic iced saline was used.  Temperature  probe was placed in septum insulating pad in the pericardium.   Attention was first turned to the mitral valve repair.  The superior  inferior and vena cavae were mobilized.  A vertical incision was made in  the interatrial groove.  This was carried up onto the dome of the left  atrium as well as down below the inferior vena cava.  This provided  excellent exposure of the mitral valve.  There was no prolapse of the  valve.  The leaflets were pliable and completely mobile.  Subvalvular  apparatus was all normal.  It appeared that the regurgitation was  probably ischemic in nature with left  ventricular dilatation.  Then, a  series of 2-0 Ethibond sutures were placed around the mitral annulus.  The annulus was sized and a 28-mm Sorin 3D MEMO ring was chosen.  This  had serial number [id number].  The sutures were placed through this ring and  the ring lowered in place.  The sutures were tied sequentially.  The  mitral valve was tested with the left ventricle completely filled with  saline, and there was no mitral regurgitation.  The left atriotomy  incision was then closed in 2 layers using continuous 3-0 Prolene  suture.   Then, the LAD was grafted.  The left internal mammary artery was brought  through an opening in the left pericardium and anterior to the phrenic  nerve.  The internal diameter of the mid to distal LAD was about 1.6 mm.  The left internal mammary graft anastomosed the LAD in end-to-side  manner using continuous 8-0 Prolene suture.  The anastomosis appeared  hemostatic and there was rapid flow of blood seen down in the LAD.  The  pedicle was sutured to the epicardium to prevent rotation.   Then, the right internal mammary pedicle was brought through an opening  in the right pericardium anterior to the phrenic nerve.  The right  coronary artery was opened distally just before the takeoff of the  posterior descending branch.  The internal diameter here was about 1.75  mm.  The right mammary pedicle was then anastomosed to the right  coronary artery in an end-to-side manner continuous 8-0 Prolene suture.  The anastomosis was hemostatic and there was rapid flow of blood down  the right coronary artery.  The pedicle was sutured to the epicardium to  prevent rotation.  The patient was rewarmed to 37 degrees centigrade.  Left side of the heart was de-aired and head placed in Trendelenburg  position.  Crossclamp was removed with a time of 120 minutes.  There was  spontaneous return of sinus rhythm.  The atriotomy and distal  anastomosis appeared hemostatic.  Two  temporary right ventricular and  right atrial pacing wire were placed above through the skin.  After a  period of reperfusion, the patient was weaned from cardiopulmonary  bypass on low-dose dopamine and milrinone.  Total bypass time was  162  minutes.  Transesophageal echocardiogram was performed and showed no  mitral regurgitation.  Left ventricular function still appeared markedly  reduced, although there was improved contractility of the inferior wall.  The anterior wall and apex remained akinetic.  Right ventricular  function was normal.  He remained hemodynamically stable after weaned  from bypass.  Protamine was then given and the venous and aortic  cannulas were removed without difficulty.  Hemostasis was achieved.  Four chest tubes were placed with bilateral pleural tubes, 2 in the  posterior pericardium, 1 in the anterior mediastinum.  Sternum was  closed with double #6 stainless steel wires.  Fascia was closed with  continuous #1 Vicryl suture.  Subcutaneous tissue was closed with  continuous 2-0 Vicryl and the skin with a 3-0 Vicryl subcuticular  closure.  The sponge, needle, and instrument counts were correct  according to the scrub nurse.  Dry sterile dressing was applied over the  incision and around the chest tubes with Pleur-Evac suction.  The  patient remained hemodynamically stable, and was transported to the SICU  in guarded, but stable condition.      Evelene Croon, M.D.  Electronically Signed     BB/MEDQ  D:  05/06/2009  T:  05/06/2009  Job:  [id number]   cc:   Marca Ancona, MD

## 2011-03-06 NOTE — Discharge Summary (Signed)
NAMERYDELL, WIEGEL NO.:  [id number]   MEDICAL RECORD NO.:  [id number]          PATIENT TYPE:  INP   LOCATION:  2036                         FACILITY:  [facility]   PHYSICIAN:  Evelene Croon, M.D.     DATE OF BIRTH:  July 25, 1961   DATE OF ADMISSION:  05/02/2009  DATE OF DISCHARGE:  05/09/2009                               DISCHARGE SUMMARY   PRIMARY ADMITTING DIAGNOSIS:  Chest pain.   ADDITIONAL/DISCHARGE DIAGNOSES:  1. Severe three-vessel coronary artery disease.  2. History of prior myocardial infarction.  3. Hyperlipidemia.  4. History of right rotator cuff injury.  5. Postoperative atrial fibrillation.  6. Mitral regurgitation.  7. Severe left ventricular dysfunction with ejection fraction of 25%.  8. History of tobacco abuse.   PROCEDURES PERFORMED:  1. Cardiac catheterization.  2. Coronary artery bypass grafting x2 (left internal mammary artery to      the left anterior descending, right internal mammary artery to the      right coronary artery).  3. Mitral valve repair with 28-mm MEMO 3D ring annuloplasty.   HISTORY:  The patient is a 50 year old male with a history of  hyperlipidemia and tobacco abuse.  About 10 days prior to presentation,  he had an episode of exertional substernal chest pain while replacing  any engine in his car.  It was relieved quickly with rest, and he had no  further episodes.  [place] then [place] the [place] and had  some right shoulder discomfort, which he thought was related to a  previous rotator cuff injury.  He went to the emergency room on April 28, 2009, to have a right shoulder x-ray, and at that point, an EKG was  performed, which showed anterior and inferior Q waves suggesting a prior  myocardial infarction.  Cardiac enzymes were negative.  He has had no  chest discomfort since that time.  He was subsequently referred to  [facility] for further evaluation.  He was seen in the office by  Dr. Shirlee Latch,  and it was felt that he should undergo an elective cardiac  catheterization to further delineate his coronary anatomy.  He was also  noted to have some hypertension and was started on metoprolol, as well  as an aspirin a day, and Lipitor.  He was also noted on physical exam to  have a cardiac murmur.  It was felt that he will require evaluation with  a ventriculogram and catheterization and an echocardiogram to evaluate  his mitral valve.  He underwent cardiac catheterization on the date of  this admission, which showed a proximal LAD occlusion with a distal  vessel filling by collaterals from a large obtuse marginal branch.  The  right coronary artery was occluded proximally with bridging collaterals  filling midportion of the vessel.  The midportion was then occluded  after the acute marginal with filling of the distal vessel by  collaterals from the obtuse marginal.  The left circumflex was occluded  after the first marginal with a small distal vessel barely filling.  Left ventricular ejection fraction was estimated  at 30-35% by hand  injection, which was done due to increased LVEDP.  It was not possible  to assess the mitral valve.  Because of the findings on catheterization  and his recent EKG indicative of anterior and inferior myocardial  infarction, it was felt he should be admitted for further workup.   HOSPITAL COURSE:  Mr. Raffel was evaluated by Cardiac Surgery for a  possible bypass grafting.  Dr. Laneta Simmers saw the patient and reviewed his  films and agreed that CABG would be his best course of action.  He  recommended a 2-D echocardiogram prior to surgery to evaluate his valve.  The 2-D echo showed eccentric MR that was at least moderate with an EF  of 25-30%.  He felt that he would also require a mitral valve repair at  the time of surgery.  All risks, benefits, and alternatives of surgery  were explained to the patient, he agreed to proceed.  He remained stable  and pain free  in the hospital during his preoperative workup.  He had  carotid Doppler studies, which showed no ICA stenosis and lower  extremity ABIs which were greater than 1.0 bilaterally.  He was taken to  the operating room on May 05, 2009, and underwent CABG x2 and mitral  valve repair as described above, performed by Dr. Laneta Simmers.  Please see  previously dictated operative report for complete details of surgery.  He tolerated the procedure well and was transferred to the SICU in  stable condition.  He was able to be extubated shortly after surgery.  He was hemodynamically stable and doing well on postop day 1.  His chest  tubes and hemodynamic monitoring devices were removed, and he was kept  in the unit for further observation.  He did develop atrial fibrillation  and was started on amiodarone drip.  He converted to normal sinus rhythm  and was switched to p.o. amiodarone.  Once his blood pressure was able  to tolerate it postoperatively, he was started on both an ACE inhibitor  and a beta-blocker.  He was also continued on statin, low-dose aspirin,  and was started on Coumadin for his mitral valve ring.  By the end of  postop day 2, he was ready for transfer to the step-down unit.  His  postoperative course has been uneventful.  He has been counseled  extensively regarding smoking cessation.  He has been ambulating in the  halls with cardiac rehab phase 1 and is progressing well.  His beta-  blocker dose has been titrated upward by Cardiology, and he is  tolerating without problem.  His incisions are healing well.  He is  maintaining sinus rhythm, and his other vital signs have been stable.  His most recent labs show a sodium 139, potassium 4.2, BUN 15,  creatinine 0.96.  Hemoglobin 10, hematocrit 28.7, white count 11.3,  platelets 161.  PT 16.9, INR 1.3.  It was felt that since he has  remained stable and is otherwise doing well, he may be discharged home  on May 09, 2009.   DISCHARGE  MEDICATIONS:  1. Coumadin 5 mg daily until blood work is checked.  2. Coreg 6.25 mg b.i.d.  3. Oxycodone 5 mg 1-2 q.4-6 h. p.r.n. for pain.  4. Amiodarone 400 mg daily.  5. Lisinopril 10 mg daily.  6. Aspirin 81 mg daily.  7. Lipitor 80 mg daily.   DISCHARGE INSTRUCTIONS:  He is asked to refrain from driving, heavy  lifting, or strenuous  activity.  He may continue ambulating daily and  using his incentive spirometer.  He may shower daily and clean his  incisions with soap and water.  He will continue low-fat, low-sodium  diet.   DISCHARGE FOLLOWUP:  He will need to follow up in the next 48 hours with  [facility] for PT and INR drawn.  He will need to make an  appointment to see Dr. Shirlee Latch in 2 weeks.  He will then see Dr. Laneta Simmers  in  3 weeks, and our office will contact him with an appointment, as well as  the time for chest x-ray at [facility].  In the interim, if he  experiences any problems or has questions, he is asked to contact our  office immediately.      Coral Ceo, P.A.      Evelene Croon, M.D.  Electronically Signed    GC/MEDQ  D:  05/09/2009  T:  05/10/2009  Job:  [id number]   cc:   Marca Ancona, MD  [name], M.D.

## 2011-03-14 ENCOUNTER — Ambulatory Visit (INDEPENDENT_AMBULATORY_CARE_PROVIDER_SITE_OTHER): Payer: Self-pay | Admitting: Emergency Medicine

## 2011-03-14 DIAGNOSIS — I4891 Unspecified atrial fibrillation: Secondary | ICD-10-CM

## 2011-03-14 LAB — POCT INR: INR: 1.9

## 2011-03-24 ENCOUNTER — Other Ambulatory Visit: Payer: Self-pay | Admitting: Cardiology

## 2011-03-28 ENCOUNTER — Other Ambulatory Visit: Payer: Self-pay

## 2011-03-28 MED ORDER — WARFARIN SODIUM 5 MG PO TABS: 5.0000 mg | ORAL_TABLET | ORAL | Status: DC

## 2011-03-28 MED ORDER — ATORVASTATIN CALCIUM 80 MG PO TABS: 80.0000 mg | ORAL_TABLET | Freq: Every day | ORAL | Status: DC

## 2011-04-03 ENCOUNTER — Other Ambulatory Visit: Payer: Self-pay | Admitting: Emergency Medicine

## 2011-04-03 MED ORDER — WARFARIN SODIUM 5 MG PO TABS: ORAL_TABLET | ORAL | Status: DC

## 2011-04-03 MED ORDER — ATORVASTATIN CALCIUM 80 MG PO TABS: 80.0000 mg | ORAL_TABLET | Freq: Every day | ORAL | Status: DC

## 2011-04-10 ENCOUNTER — Telehealth: Payer: Self-pay | Admitting: Emergency Medicine

## 2011-04-10 NOTE — Telephone Encounter (Signed)
Unfortunately I think he is going to need a stronger med than simvastatin.  He can take the generic atorvastatin 80 rather than Lipitor.

## 2011-04-10 NOTE — Telephone Encounter (Signed)
Patient called in wanting to know if he can change his cholesterol medicine to Simvastatin. He states that it will be much cheaper than what he is taking now which is Lipitor 80.     [facility] [address].

## 2011-04-10 NOTE — Telephone Encounter (Signed)
Please call pt

## 2011-04-10 NOTE — Telephone Encounter (Signed)
Pt.notified

## 2011-04-11 ENCOUNTER — Ambulatory Visit (INDEPENDENT_AMBULATORY_CARE_PROVIDER_SITE_OTHER): Payer: Self-pay | Admitting: Emergency Medicine

## 2011-04-11 DIAGNOSIS — I513 Intracardiac thrombosis, not elsewhere classified: Secondary | ICD-10-CM

## 2011-04-11 DIAGNOSIS — I4891 Unspecified atrial fibrillation: Secondary | ICD-10-CM

## 2011-04-11 DIAGNOSIS — I219 Acute myocardial infarction, unspecified: Secondary | ICD-10-CM

## 2011-04-11 DIAGNOSIS — Z7901 Long term (current) use of anticoagulants: Secondary | ICD-10-CM

## 2011-04-24 IMAGING — CR DG CHEST 2V
2 series · 2 of 2 positions shown · non-contrast
Comparison: 06/07/2009 and earlier.

CLINICAL DATA: 48-year-old male status post pacemaker placement.

CHEST - 2 VIEW

[w chest pa]
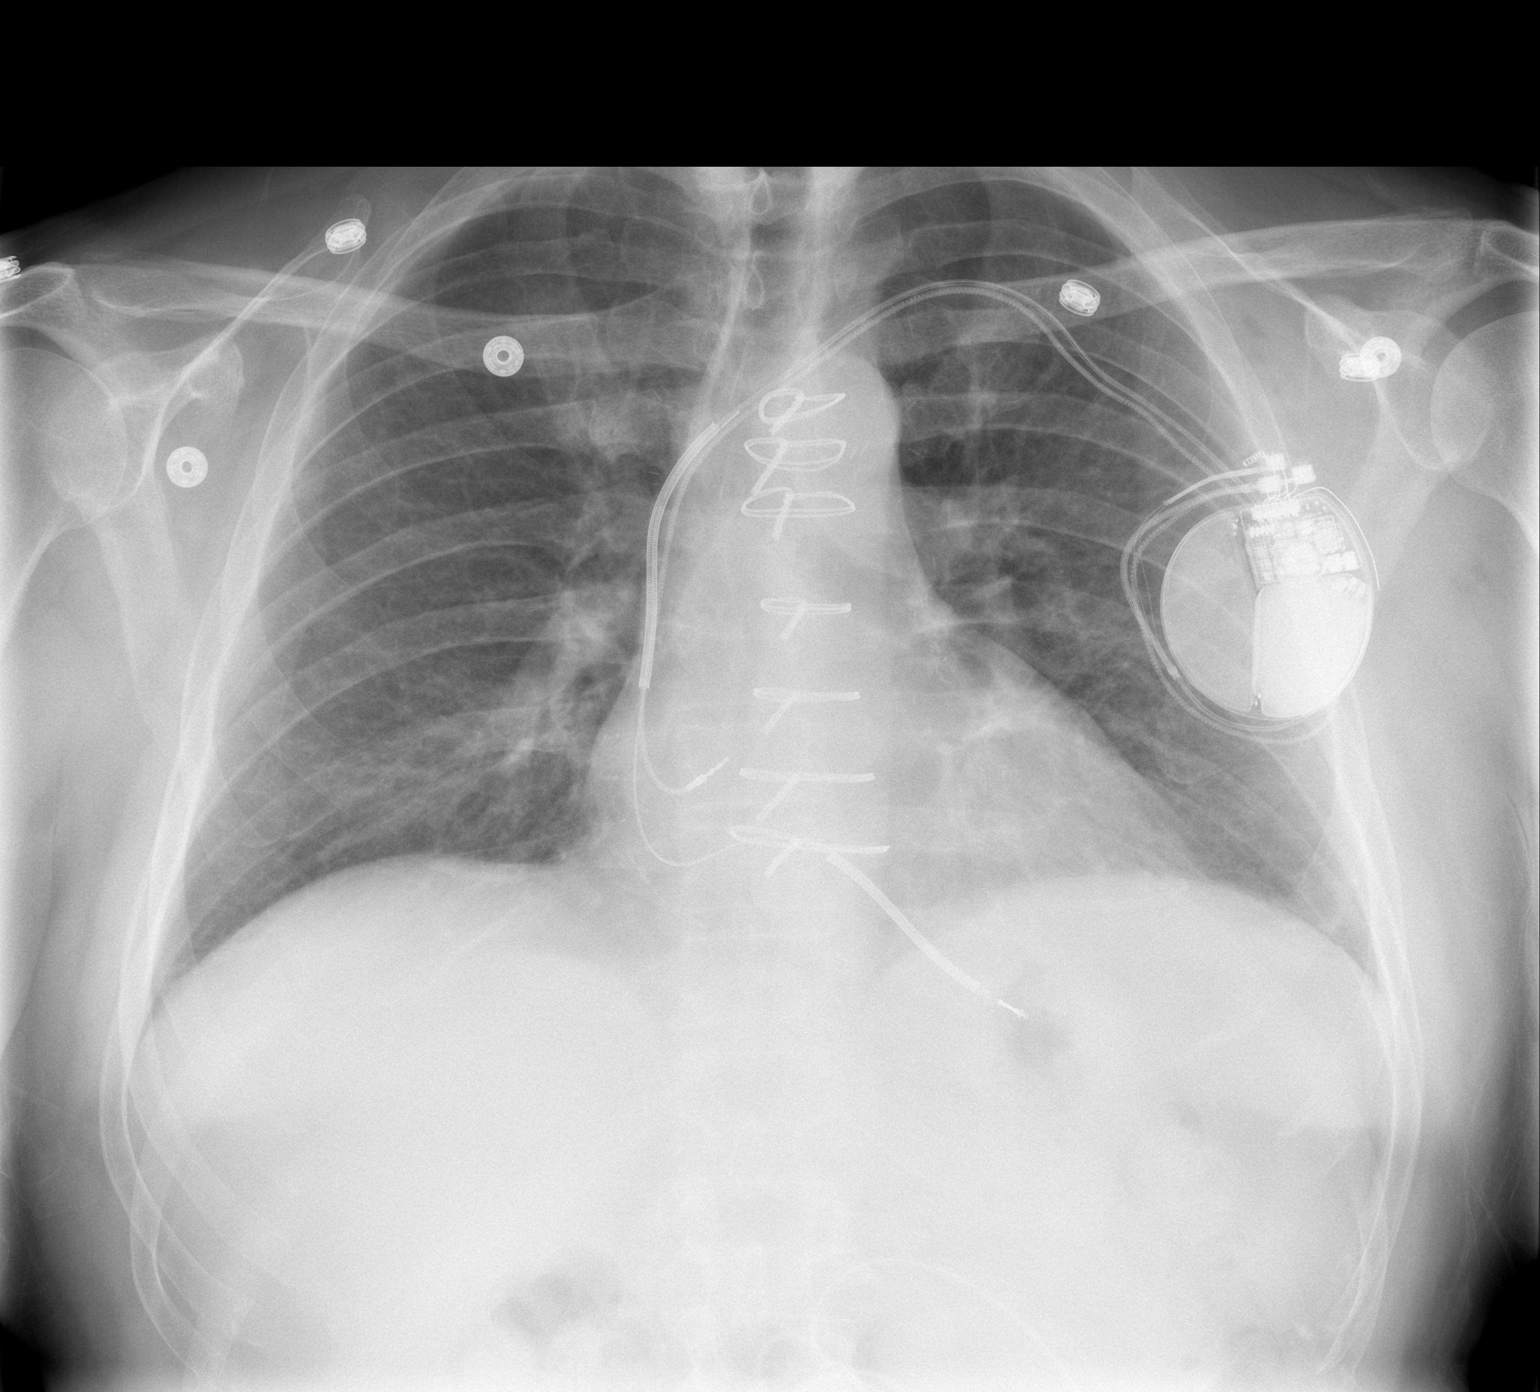

[w chest lat]
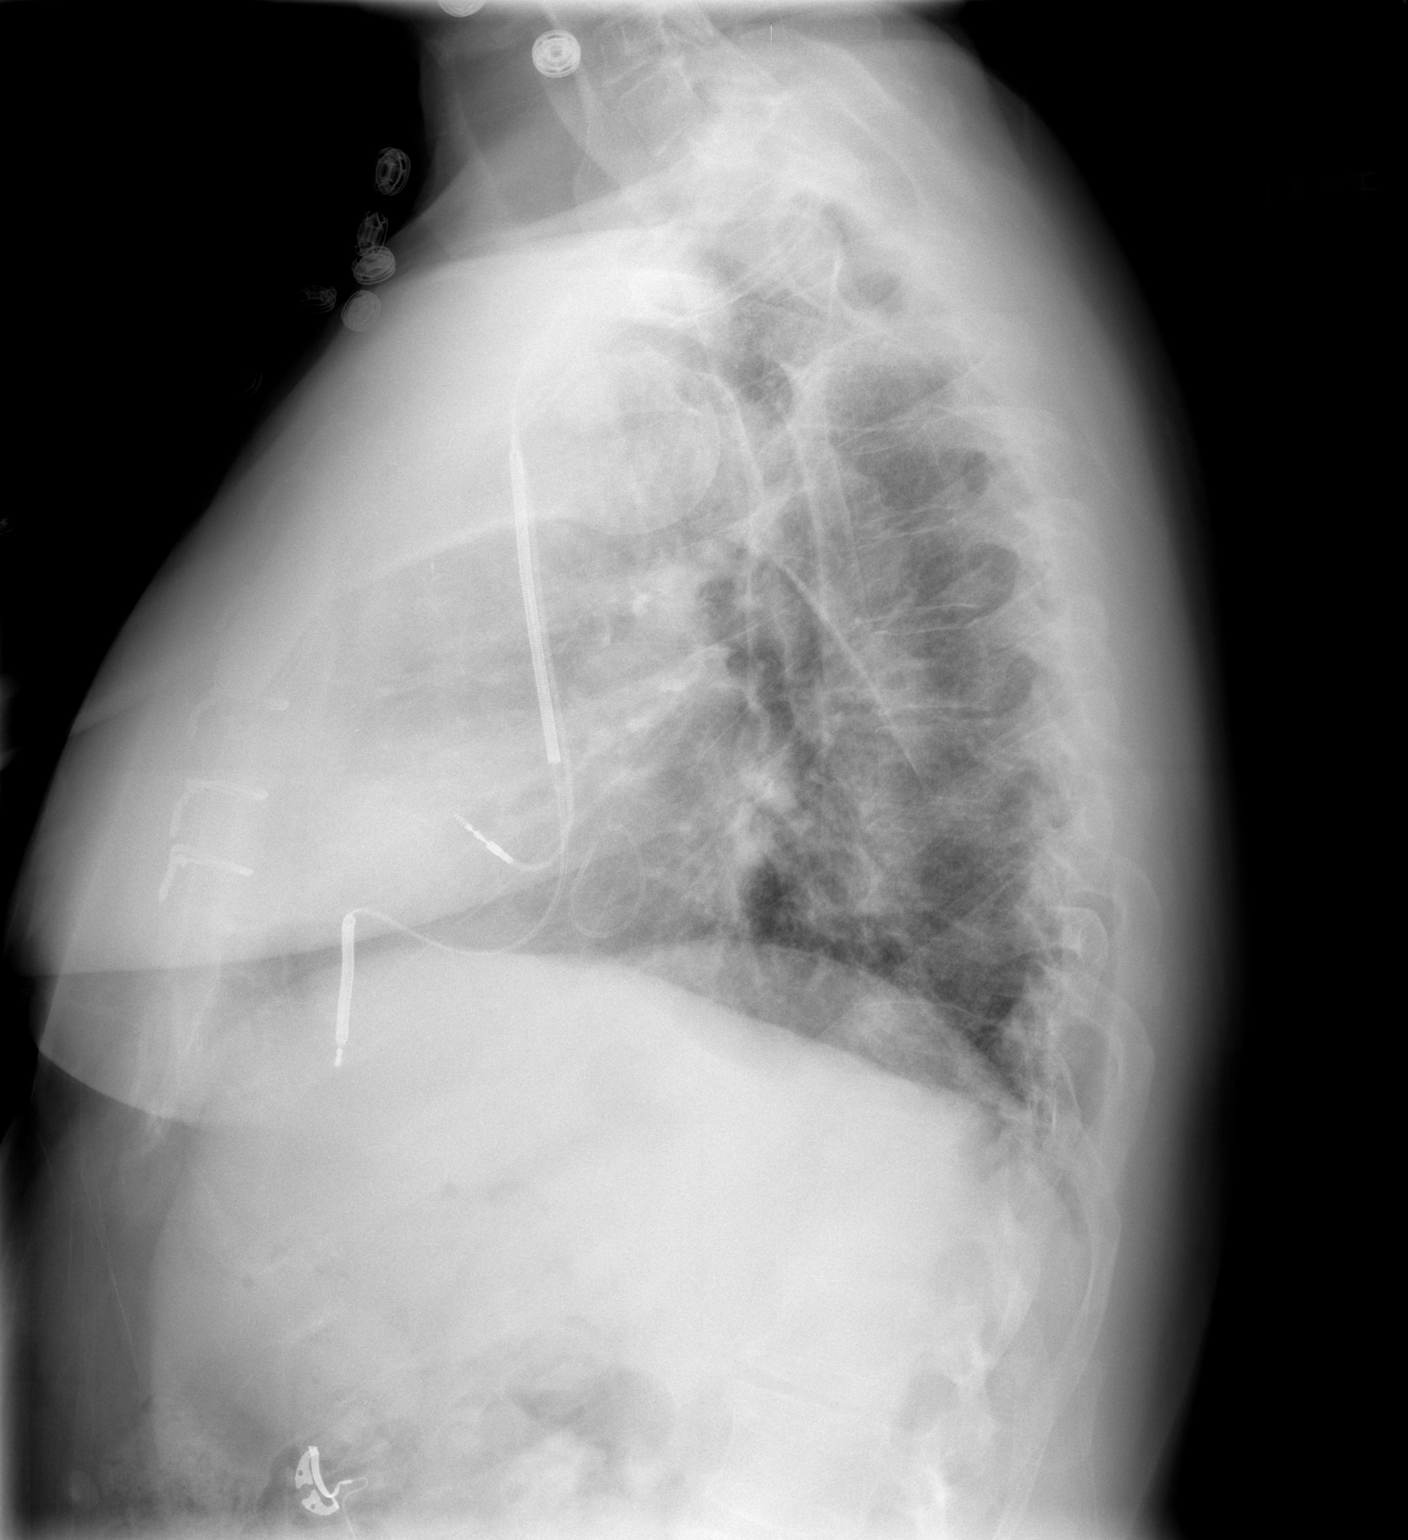

[2 of 2 positions shown; findings below may reference images not displayed]

FINDINGS: New left chest dual lead cardiac AICD.  Stable
preexisting sequelae of median sternotomy and cardiac valve
replacement.  Low lung volumes.  Stable cardiac size and
mediastinal contours.  Visualized tracheal air column is within
normal limits.  No pneumothorax.  Mild atelectasis.  No pulmonary
edema or pleural effusion. Stable visualized osseous structures.
IMPRESSION: 1.  Left chest cardiac AICD placed.
2. Low lung volumes, otherwise no acute cardiopulmonary
abnormality.

## 2011-04-27 ENCOUNTER — Other Ambulatory Visit: Payer: Self-pay | Admitting: Cardiology

## 2011-05-09 ENCOUNTER — Ambulatory Visit (INDEPENDENT_AMBULATORY_CARE_PROVIDER_SITE_OTHER): Payer: Self-pay | Admitting: Emergency Medicine

## 2011-05-09 DIAGNOSIS — I219 Acute myocardial infarction, unspecified: Secondary | ICD-10-CM

## 2011-05-09 DIAGNOSIS — I4891 Unspecified atrial fibrillation: Secondary | ICD-10-CM

## 2011-05-09 DIAGNOSIS — Z7901 Long term (current) use of anticoagulants: Secondary | ICD-10-CM

## 2011-05-09 DIAGNOSIS — I513 Intracardiac thrombosis, not elsewhere classified: Secondary | ICD-10-CM

## 2011-05-09 LAB — POCT INR: INR: 2.6

## 2011-05-15 ENCOUNTER — Encounter: Payer: Self-pay | Admitting: Internal Medicine

## 2011-05-15 ENCOUNTER — Ambulatory Visit (INDEPENDENT_AMBULATORY_CARE_PROVIDER_SITE_OTHER): Payer: Self-pay | Admitting: Internal Medicine

## 2011-05-15 DIAGNOSIS — Z95810 Presence of automatic (implantable) cardiac defibrillator: Secondary | ICD-10-CM | POA: Insufficient documentation

## 2011-05-15 DIAGNOSIS — I219 Acute myocardial infarction, unspecified: Secondary | ICD-10-CM

## 2011-05-15 DIAGNOSIS — I2589 Other forms of chronic ischemic heart disease: Secondary | ICD-10-CM

## 2011-05-15 DIAGNOSIS — I513 Intracardiac thrombosis, not elsewhere classified: Secondary | ICD-10-CM

## 2011-05-15 LAB — ICD DEVICE OBSERVATION
AL IMPEDENCE ICD: 485 Ohm
AL THRESHOLD: 0.7 V
ATRIAL PACING ICD: 1 pct
BAMS-0003: 70 {beats}/min
TZAT-0018FASTVT: NEGATIVE
TZON-0004FASTVT: 2.5
TZON-0005FASTVT: 1
TZST-0001FASTVT: 3
TZST-0001FASTVT: 4
TZST-0001FASTVT: 8
TZST-0002FASTVT: NEGATIVE
TZST-0002FASTVT: NEGATIVE
VENTRICULAR PACING ICD: 1 pct

## 2011-05-15 NOTE — Assessment & Plan Note (Signed)
The patient's device was interrogated and the information was fully reviewed.  The device was reprogrammed to modify detection and treatment algorithm

## 2011-05-15 NOTE — Progress Notes (Signed)
  HPI  [name] [name] is a 50 y.o. male  followup for an ICD implanted for primary prevention in the setting of ischemic heart disease prior bypass surgery and ejection fraction of 20-25%. He also has an apical thrombus for which he takes Coumadin.  He is enrolled in the MADIT-R IT trial. He's had no problems with his defibrillator since implantation. The patient denies SOB, chest pain, edema or palpitations   Echo was reviewed from 6/11, showing EF 40% (improved) with dyskinetic apex.   He is anticpating moving towards [place]     Past Medical History  Diagnosis Date  . Hyperlipidemia   . Coronary artery disease   . Atrial fibrillation   . Post-infarction apical thrombus     on coumadin    Past Surgical History  Procedure Date  . Coronary artery bypass graft   . Cardiac defibrillator placement     [employer]  . Knee surgery     Current Outpatient Prescriptions  Medication Sig Dispense Refill  . atorvastatin (LIPITOR) 80 MG tablet Take 1 tablet (80 mg total) by mouth daily.  90 tablet  3  . carvedilol (COREG) 25 MG tablet TAKE ONE TABLET BY MOUTH TWICE A DAY  60 tablet  6  . enalapril (VASOTEC) 10 MG tablet TAKE ONE TABLET BY MOUTH TWICE A DAY  60 tablet  3  . fish oil-omega-3 fatty acids 1000 MG capsule Take 2,400 mg by mouth.        . nitroGLYCERIN (NITROSTAT) 0.4 MG SL tablet Place 0.4 mg under the tongue every 5 (five) minutes as needed.        Marland Kitchen spironolactone (ALDACTONE) 25 MG tablet TAKE ONE TABLET BY MOUTH DAILY  30 tablet  3  . warfarin (COUMADIN) 5 MG tablet 1 tablet daily, as directed by coumadin clinic  90 tablet  4    No Known Allergies  Review of Systems negative except from HPI and PMH  Physical Exam Well developed and well nourished in no acute distress HENT normal E scleral and icterus clear Neck Supple JVP flat; carotids brisk and full Clear to ausculation Regular rate and rhythm, no murmurs gallops or rub Soft with active bowel sounds No  clubbing cyanosis and edema Alert and oriented, grossly normal motor and sensory function Skin Warm and Dry  Assessment and  Plan

## 2011-05-15 NOTE — Patient Instructions (Signed)
Continue with current medications. Follow up with Dr. Graciela Husbands in 3 months.

## 2011-05-15 NOTE — Assessment & Plan Note (Signed)
stabel on current meds

## 2011-05-16 ENCOUNTER — Encounter: Payer: Self-pay | Admitting: *Deleted

## 2011-06-06 ENCOUNTER — Ambulatory Visit (INDEPENDENT_AMBULATORY_CARE_PROVIDER_SITE_OTHER): Payer: Self-pay | Admitting: Emergency Medicine

## 2011-06-06 DIAGNOSIS — Z7901 Long term (current) use of anticoagulants: Secondary | ICD-10-CM

## 2011-06-06 DIAGNOSIS — I219 Acute myocardial infarction, unspecified: Secondary | ICD-10-CM

## 2011-06-06 DIAGNOSIS — I4891 Unspecified atrial fibrillation: Secondary | ICD-10-CM

## 2011-06-06 DIAGNOSIS — I513 Intracardiac thrombosis, not elsewhere classified: Secondary | ICD-10-CM

## 2011-06-06 LAB — POCT INR: INR: 2.5

## 2011-06-13 ENCOUNTER — Telehealth: Payer: Self-pay | Admitting: *Deleted

## 2011-06-13 NOTE — Telephone Encounter (Signed)
Calling from [employer] about pt's disability status, they need something in writing stating that pt's status unchanged since we submitted disability physical capacity eval form on 09/26/10. You saw pt last 02/2011. Can I send letter stating that you still agree with disability status. I'm sending this because they say this is needed asap.

## 2011-06-13 NOTE — Telephone Encounter (Signed)
Yes, you can say that and send the letter.

## 2011-06-14 ENCOUNTER — Encounter: Payer: Self-pay | Admitting: *Deleted

## 2011-06-14 NOTE — Telephone Encounter (Signed)
Noted.  Letter faxed.

## 2011-07-04 ENCOUNTER — Ambulatory Visit (INDEPENDENT_AMBULATORY_CARE_PROVIDER_SITE_OTHER): Payer: Self-pay | Admitting: Emergency Medicine

## 2011-07-04 DIAGNOSIS — I219 Acute myocardial infarction, unspecified: Secondary | ICD-10-CM

## 2011-07-04 DIAGNOSIS — I513 Intracardiac thrombosis, not elsewhere classified: Secondary | ICD-10-CM

## 2011-07-04 DIAGNOSIS — Z7901 Long term (current) use of anticoagulants: Secondary | ICD-10-CM

## 2011-07-04 DIAGNOSIS — I4891 Unspecified atrial fibrillation: Secondary | ICD-10-CM

## 2011-07-04 LAB — POCT INR: INR: 2.8

## 2011-08-01 ENCOUNTER — Ambulatory Visit (INDEPENDENT_AMBULATORY_CARE_PROVIDER_SITE_OTHER): Payer: Self-pay | Admitting: Emergency Medicine

## 2011-08-01 DIAGNOSIS — Z7901 Long term (current) use of anticoagulants: Secondary | ICD-10-CM

## 2011-08-01 DIAGNOSIS — I4891 Unspecified atrial fibrillation: Secondary | ICD-10-CM

## 2011-08-01 DIAGNOSIS — I513 Intracardiac thrombosis, not elsewhere classified: Secondary | ICD-10-CM

## 2011-08-01 DIAGNOSIS — I219 Acute myocardial infarction, unspecified: Secondary | ICD-10-CM

## 2011-08-03 ENCOUNTER — Telehealth: Payer: Self-pay

## 2011-08-03 NOTE — Telephone Encounter (Signed)
Please call regarding medications

## 2011-08-16 ENCOUNTER — Encounter: Payer: Self-pay | Admitting: *Deleted

## 2011-08-23 ENCOUNTER — Encounter: Payer: Self-pay | Admitting: *Deleted

## 2011-08-27 ENCOUNTER — Ambulatory Visit (INDEPENDENT_AMBULATORY_CARE_PROVIDER_SITE_OTHER): Payer: Self-pay | Admitting: *Deleted

## 2011-08-27 DIAGNOSIS — I4891 Unspecified atrial fibrillation: Secondary | ICD-10-CM

## 2011-08-27 DIAGNOSIS — I428 Other cardiomyopathies: Secondary | ICD-10-CM

## 2011-08-29 ENCOUNTER — Ambulatory Visit (INDEPENDENT_AMBULATORY_CARE_PROVIDER_SITE_OTHER): Payer: Self-pay | Admitting: Emergency Medicine

## 2011-08-29 DIAGNOSIS — I513 Intracardiac thrombosis, not elsewhere classified: Secondary | ICD-10-CM

## 2011-08-29 DIAGNOSIS — I4891 Unspecified atrial fibrillation: Secondary | ICD-10-CM

## 2011-08-29 DIAGNOSIS — Z7901 Long term (current) use of anticoagulants: Secondary | ICD-10-CM

## 2011-08-29 DIAGNOSIS — I219 Acute myocardial infarction, unspecified: Secondary | ICD-10-CM

## 2011-09-08 ENCOUNTER — Other Ambulatory Visit: Payer: Self-pay | Admitting: Cardiology

## 2011-09-11 ENCOUNTER — Encounter: Payer: Self-pay | Admitting: Internal Medicine

## 2011-09-11 ENCOUNTER — Other Ambulatory Visit: Payer: Self-pay | Admitting: Internal Medicine

## 2011-09-11 LAB — REMOTE ICD DEVICE
AL AMPLITUDE: 5.3 mv
AL IMPEDENCE ICD: 454 Ohm
BAMS-0001: 160 {beats}/min
BAMS-0002: 8 ms
BAMS-0003: 70 {beats}/min
CHARGE TIME: 9.2 s
DEV-0020ICD: NEGATIVE
DEVICE MODEL ICD: 151503
HV IMPEDENCE: 52 Ohm
RV LEAD AMPLITUDE: 25 mv
RV LEAD IMPEDENCE ICD: 491 Ohm
TZAT-0001FASTVT: 1
TZAT-0001FASTVT: 2
TZAT-0002FASTVT: NEGATIVE
TZAT-0002FASTVT: NEGATIVE
TZAT-0018FASTVT: NEGATIVE
TZAT-0018FASTVT: NEGATIVE
TZON-0003FASTVT: 353 ms
TZON-0004FASTVT: 2.5
TZON-0005FASTVT: 1
TZST-0001FASTVT: 3
TZST-0001FASTVT: 4
TZST-0001FASTVT: 5
TZST-0001FASTVT: 6
TZST-0001FASTVT: 7
TZST-0001FASTVT: 8
TZST-0002FASTVT: NEGATIVE
TZST-0002FASTVT: NEGATIVE
TZST-0002FASTVT: NEGATIVE
TZST-0002FASTVT: NEGATIVE
TZST-0002FASTVT: NEGATIVE
TZST-0002FASTVT: NEGATIVE

## 2011-09-12 NOTE — Progress Notes (Signed)
icd remote check  

## 2011-09-25 ENCOUNTER — Encounter: Payer: Self-pay | Admitting: Cardiology

## 2011-09-26 ENCOUNTER — Ambulatory Visit (INDEPENDENT_AMBULATORY_CARE_PROVIDER_SITE_OTHER): Payer: Self-pay | Admitting: Emergency Medicine

## 2011-09-26 ENCOUNTER — Encounter: Payer: Self-pay | Admitting: Cardiology

## 2011-09-26 ENCOUNTER — Ambulatory Visit (INDEPENDENT_AMBULATORY_CARE_PROVIDER_SITE_OTHER): Payer: Self-pay | Admitting: Cardiology

## 2011-09-26 VITALS — BP 110/68 | HR 80 | Ht 70.0 in | Wt 203.8 lb

## 2011-09-26 DIAGNOSIS — I25810 Atherosclerosis of coronary artery bypass graft(s) without angina pectoris: Secondary | ICD-10-CM

## 2011-09-26 DIAGNOSIS — I2589 Other forms of chronic ischemic heart disease: Secondary | ICD-10-CM

## 2011-09-26 DIAGNOSIS — I5022 Chronic systolic (congestive) heart failure: Secondary | ICD-10-CM

## 2011-09-26 DIAGNOSIS — I2510 Atherosclerotic heart disease of native coronary artery without angina pectoris: Secondary | ICD-10-CM

## 2011-09-26 DIAGNOSIS — Z7901 Long term (current) use of anticoagulants: Secondary | ICD-10-CM

## 2011-09-26 DIAGNOSIS — I4891 Unspecified atrial fibrillation: Secondary | ICD-10-CM

## 2011-09-26 DIAGNOSIS — I509 Heart failure, unspecified: Secondary | ICD-10-CM

## 2011-09-26 DIAGNOSIS — I513 Intracardiac thrombosis, not elsewhere classified: Secondary | ICD-10-CM

## 2011-09-26 DIAGNOSIS — I219 Acute myocardial infarction, unspecified: Secondary | ICD-10-CM

## 2011-09-26 DIAGNOSIS — E785 Hyperlipidemia, unspecified: Secondary | ICD-10-CM

## 2011-09-26 NOTE — Patient Instructions (Signed)
We will call you with lab results drawn today.  Your physician recommends that you return for lab work in: 3 months (BMP)   Your physician recommends that you schedule a follow-up appointment in: 6 months

## 2011-09-27 LAB — BASIC METABOLIC PANEL
BUN/Creatinine Ratio: 15 (ref 9–20)
BUN: 13 mg/dL (ref 6–24)
CO2: 18 mmol/L — ABNORMAL LOW (ref 20–32)
Chloride: 103 mmol/L (ref 97–108)
Potassium: 4.2 mmol/L (ref 3.5–5.2)
Sodium: 139 mmol/L (ref 134–144)

## 2011-09-27 LAB — HEPATIC FUNCTION PANEL
Albumin: 4.6 g/dL (ref 3.5–5.5)
Alkaline Phosphatase: 74 IU/L (ref 25–150)
Bilirubin, Direct: 0.1 mg/dL (ref 0.00–0.40)
Total Protein: 7.1 g/dL (ref 6.0–8.5)

## 2011-09-27 LAB — CBC WITH DIFFERENTIAL
Eosinophils Absolute: 0.3 10*3/uL (ref 0.0–0.4)
Immature Grans (Abs): 0 10*3/uL (ref 0.0–0.1)
Immature Granulocytes: 0 % (ref 0–2)
MCH: 30.5 pg (ref 26.6–33.0)
MCHC: 35 g/dL (ref 31.5–35.7)
Monocytes Absolute: 0.6 10*3/uL (ref 0.1–1.0)
Neutrophils Relative %: 70 % (ref 40–74)
Platelets: 277 10*3/uL (ref 140–415)

## 2011-09-27 LAB — LIPID PANEL: VLDL Cholesterol Cal: 30 mg/dL (ref 5–40)

## 2011-09-28 NOTE — Progress Notes (Signed)
50 yo with h/o CAD s/p CABG and ischemic cardiomyopathy as well as LV apical thrombus returns for followup.  He is doing quite well with minimal symptoms.  No chest pain. No exertional dyspnea.   He is tolerating all his meds. [place]  Echo from 6/11 showed EF 40% (improved) with dyskinetic apex.  Weight is down 2 lbs since last appointment.  ECG: NSR, old inferior and anterior MIs with inferior and anterolateral T wave inversions  Labs (12/10): creatinine 0.85 Labs (4/11): K 4.5, creatinine 0.87, LDL 71, HDL 36 Labs (11/11): LDL 83, HDL 34 Labs (5/12): K 4.1, creatinine 0.95, LDL 60, HDL 36, K 4.1, creatinine 1.61  Allergies (verified):  No Known Drug Allergies  Family History: Father with MI at 28 Mother with CHF Hyperlipidemia runs in family  Social History: Lives with wife in [place].  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  Originally from [place].  Smoked 1 ppd x years, quit 7/10.  Occasional ETOH.  No drugs.   Past Medical History: 1.  Hyperlipidemia 2.  Smoking: quit 7/10 3.  CAD:  LHC (7/10) with EF 35%, RCA totally occluded, ostial LAD totally occluded, mid CFX totally occluded.  Large OM1 with collaterals to the LAD and RCA.  CABG with LIMA-LAD, RIMA-RCA.   4.  Moderate MR: MV repair/annuloplasty ring at time of CABG (7/10). 5.  Ischemic CMP: echo (7/10) with mild LV dilatation, EF 25-30%.  Anteroseptal, inferoseptal, inferior, and apical akinesis.  RV normal.  Moderate MR.  Repeat echo (10/10) EF 20%, mild to moderately dilated, dyskinetic apex, akinetic septum, severe hypokinesis of the inferior and anterior walls. Mild MR.  LV apical thrombus.  Repeat echo (6/11): EF 40%, moderately dilated LV, mild LVH, apical anterior wall/apical septal wall/apex dyskinetic, s/p MV repair with no MS or MR. LV apical thrombus not seen on this echo.  6.  Atrial fibrillation: post-op  CABG.  7.  LV apical thrombus: on coumadin.  8.  [employer] dual chamber AICD (11/10)  Current Outpatient Prescriptions  Medication Sig Dispense Refill  . atorvastatin (LIPITOR) 80 MG tablet Take 1 tablet (80 mg total) by mouth daily.  90 tablet  3  . carvedilol (COREG) 25 MG tablet TAKE ONE TABLET BY MOUTH TWICE A DAY  60 tablet  6  . enalapril (VASOTEC) 10 MG tablet TAKE ONE TABLET BY MOUTH TWICE A DAY  60 tablet  3  . fish oil-omega-3 fatty acids 1000 MG capsule Take 2,400 mg by mouth.        . nitroGLYCERIN (NITROSTAT) 0.4 MG SL tablet Place 0.4 mg under the tongue every 5 (five) minutes as needed.        Marland Kitchen spironolactone (ALDACTONE) 25 MG tablet TAKE ONE TABLET BY MOUTH DAILY  30 tablet  3  . warfarin (COUMADIN) 5 MG tablet 1 tablet daily, as directed by coumadin clinic  90 tablet  4    BP 110/68  Pulse 80  Ht 5\' 10"  (1.778 m)  Wt 92.42 kg (203 lb 12 oz)  BMI 29.23 kg/m2 General:  Well developed, well nourished, in no acute distress. Neck:  Neck supple, no JVD. No masses, thyromegaly or abnormal cervical nodes. Lungs:  Clear bilaterally to auscultation and percussion. Heart:  Non-displaced PMI, chest non-tender; regular rate and rhythm, S1, S2 without rubs or gallops.  No murmur.  Carotid upstroke normal, no  bruit. Pedals normal pulses. No edema, no varicosities. Abdomen:  Bowel sounds positive; abdomen soft and non-tender without masses, organomegaly, or hernias noted. No hepatosplenomegaly. Extremities:  No clubbing or cyanosis. Neurologic:  Alert and oriented x 3. Psych:  Normal affect.

## 2011-09-28 NOTE — Assessment & Plan Note (Signed)
No ischemic symptoms.  Will draw lipids/LFTs,  goal LDL < 70.  Continue ASA, statin, ACEI, beta blocker.

## 2011-09-28 NOTE — Assessment & Plan Note (Signed)
NYHA class I symptoms at this time.  He is doing very well and is active.  Continue current doses of carvedilol, enalapril and spironolactone.  EF improved to 40% on last echo.  He has an ICD.  Check BMET today.

## 2011-09-28 NOTE — Assessment & Plan Note (Signed)
Not seen on last echo.  Continue coumadin for now.

## 2011-10-05 ENCOUNTER — Telehealth: Payer: Self-pay | Admitting: *Deleted

## 2011-10-05 NOTE — Telephone Encounter (Signed)
Pt notified labs normal, see lab note.

## 2011-10-24 ENCOUNTER — Encounter: Payer: Self-pay | Admitting: Emergency Medicine

## 2011-10-24 ENCOUNTER — Ambulatory Visit: Payer: Self-pay | Admitting: Family Medicine

## 2011-10-24 DIAGNOSIS — Z0289 Encounter for other administrative examinations: Secondary | ICD-10-CM

## 2011-10-31 ENCOUNTER — Ambulatory Visit (INDEPENDENT_AMBULATORY_CARE_PROVIDER_SITE_OTHER): Payer: Self-pay | Admitting: Emergency Medicine

## 2011-10-31 DIAGNOSIS — I4891 Unspecified atrial fibrillation: Secondary | ICD-10-CM

## 2011-10-31 DIAGNOSIS — I219 Acute myocardial infarction, unspecified: Secondary | ICD-10-CM

## 2011-10-31 DIAGNOSIS — Z7901 Long term (current) use of anticoagulants: Secondary | ICD-10-CM

## 2011-10-31 DIAGNOSIS — I513 Intracardiac thrombosis, not elsewhere classified: Secondary | ICD-10-CM

## 2011-11-04 ENCOUNTER — Other Ambulatory Visit: Payer: Self-pay | Admitting: Cardiology

## 2011-11-19 ENCOUNTER — Telehealth: Payer: Self-pay | Admitting: *Deleted

## 2011-11-19 ENCOUNTER — Encounter: Payer: Self-pay | Admitting: *Deleted

## 2011-11-19 MED ORDER — AMOXICILLIN 500 MG PO CAPS: ORAL_CAPSULE | ORAL | Status: DC

## 2011-11-19 NOTE — Telephone Encounter (Signed)
Spoke to pt, his dental work is 12/03/11, told pt will send in Rx for Amoxicillin 2g to take 2 hrs prior to dental appt. Pt understands. Will fax letter to Dr. Marden Noble.

## 2011-11-19 NOTE — Telephone Encounter (Signed)
Dental office calling to see if pt needs ABX prior to dental appt with Dr. Ronal Fear. I do not see in pt's hx that he has had valve surgery, will verify with Dr. Shirlee Latch that he does not need prophylaxis. Please advise.

## 2011-11-19 NOTE — Telephone Encounter (Signed)
Had mitral valve repair, should use amoxicillin 2 grams prior to dental work.

## 2011-11-28 ENCOUNTER — Encounter: Payer: Self-pay | Admitting: Emergency Medicine

## 2011-12-05 ENCOUNTER — Ambulatory Visit (INDEPENDENT_AMBULATORY_CARE_PROVIDER_SITE_OTHER): Payer: Self-pay | Admitting: Cardiology

## 2011-12-05 ENCOUNTER — Ambulatory Visit (INDEPENDENT_AMBULATORY_CARE_PROVIDER_SITE_OTHER): Payer: Self-pay | Admitting: Emergency Medicine

## 2011-12-05 ENCOUNTER — Encounter: Payer: Self-pay | Admitting: Cardiology

## 2011-12-05 VITALS — BP 137/85 | HR 79 | Ht 70.0 in | Wt 207.8 lb

## 2011-12-05 DIAGNOSIS — I513 Intracardiac thrombosis, not elsewhere classified: Secondary | ICD-10-CM

## 2011-12-05 DIAGNOSIS — E785 Hyperlipidemia, unspecified: Secondary | ICD-10-CM

## 2011-12-05 DIAGNOSIS — I25810 Atherosclerosis of coronary artery bypass graft(s) without angina pectoris: Secondary | ICD-10-CM

## 2011-12-05 DIAGNOSIS — I219 Acute myocardial infarction, unspecified: Secondary | ICD-10-CM

## 2011-12-05 DIAGNOSIS — Z7901 Long term (current) use of anticoagulants: Secondary | ICD-10-CM

## 2011-12-05 DIAGNOSIS — I2589 Other forms of chronic ischemic heart disease: Secondary | ICD-10-CM

## 2011-12-05 DIAGNOSIS — I4891 Unspecified atrial fibrillation: Secondary | ICD-10-CM

## 2011-12-05 NOTE — Patient Instructions (Signed)
Follow up with Dr. Shirlee Latch in 6 months in the [place] office.  BMET in one month. BMET/LIVER/LIPID in 6 months.

## 2011-12-06 ENCOUNTER — Encounter: Payer: Self-pay | Admitting: Internal Medicine

## 2011-12-06 ENCOUNTER — Ambulatory Visit (INDEPENDENT_AMBULATORY_CARE_PROVIDER_SITE_OTHER): Payer: Self-pay | Admitting: *Deleted

## 2011-12-06 DIAGNOSIS — I428 Other cardiomyopathies: Secondary | ICD-10-CM

## 2011-12-06 LAB — REMOTE ICD DEVICE
AL AMPLITUDE: 5.2 mv
CHARGE TIME: 9.2 s
DEV-0020ICD: NEGATIVE
PACEART VT: 0
TOT-0006: 20120724000000
TZAT-0001FASTVT: 2
TZAT-0002SLOWVT: NEGATIVE
TZAT-0013FASTVT: 2
TZAT-0018FASTVT: NEGATIVE
TZAT-0018SLOWVT: NEGATIVE
TZST-0001FASTVT: 4
TZST-0001FASTVT: 5
TZST-0001SLOWVT: 3
TZST-0001SLOWVT: 4
TZST-0001SLOWVT: 7
TZST-0002SLOWVT: NEGATIVE
TZST-0002SLOWVT: NEGATIVE
TZST-0002SLOWVT: NEGATIVE
TZST-0003FASTVT: 31 J
TZST-0003FASTVT: 41 J
TZST-0003FASTVT: 41 J
TZST-0003FASTVT: 41 J
VENTRICULAR PACING ICD: 0 pct

## 2011-12-09 NOTE — Assessment & Plan Note (Addendum)
Not seen on last echo.  Continue coumadin for now.   We discussed disability today.  He is applying.  He gets too fatigued to work a full day.  This is consistent with his cardiac problems and I would agree with his application.

## 2011-12-09 NOTE — Assessment & Plan Note (Signed)
NYHA class II symptoms at this time.  He is doing very well and is active.  Continue current doses of carvedilol, enalapril and spironolactone.  EF improved to 40% on last echo.  He has an ICD.  BMET in 1 month on spironolactone.

## 2011-12-09 NOTE — Assessment & Plan Note (Signed)
No ischemic symptoms.  Will draw lipids/LFTs in 6 months,  goal LDL < 70.  Continue ASA, statin, ACEI, beta blocker.

## 2011-12-09 NOTE — Progress Notes (Signed)
51 yo with h/o CAD s/p CABG and ischemic cardiomyopathy as well as LV apical thrombus returns for followup.  He is doing quite well with minimal symptoms.  No chest pain. No exertional dyspnea.   He is tolerating all his meds. [place] property.  Echo from 6/11 showed EF 40% (improved) with dyskinetic apex.  Weight is down 2 lbs since last appointment.  Patient's main concern today is discussing disability application.   ECG: NSR, old inferior and anterior MIs with inferior and anterolateral T wave inversions  Labs (12/10): creatinine 0.85 Labs (4/11): K 4.5, creatinine 0.87, LDL 71, HDL 36 Labs (11/11): LDL 83, HDL 34 Labs (5/12): K 4.1, creatinine 0.95, LDL 60, HDL 36, K 4.1, creatinine 1.61 Labs (12/12): K 4.2, creatinine 0.82, LDL 73, HDL 43  Allergies (verified):  No Known Drug Allergies  Family History: Father with MI at 19 Mother with CHF Hyperlipidemia runs in family  Social History: Lives with wife in [place].  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  Originally from [place].  Smoked 1 ppd x years, quit 7/10.  Occasional ETOH.  No drugs.   Past Medical History: 1.  Hyperlipidemia 2.  Smoking: quit 7/10 3.  CAD:  LHC (7/10) with EF 35%, RCA totally occluded, ostial LAD totally occluded, mid CFX totally occluded.  Large OM1 with collaterals to the LAD and RCA.  CABG with LIMA-LAD, RIMA-RCA.   4.  Moderate MR: MV repair/annuloplasty ring at time of CABG (7/10). 5.  Ischemic CMP: echo (7/10) with mild LV dilatation, EF 25-30%.  Anteroseptal, inferoseptal, inferior, and apical akinesis.  RV normal.  Moderate MR.  Repeat echo (10/10) EF 20%, mild to moderately dilated, dyskinetic apex, akinetic septum, severe hypokinesis of the inferior and anterior walls. Mild MR.  LV apical thrombus.  Repeat echo (6/11): EF 40%, moderately dilated LV, mild LVH, apical anterior wall/apical septal wall/apex  dyskinetic, s/p MV repair with no MS or MR. LV apical thrombus not seen on this echo.  6.  Atrial fibrillation: post-op CABG.  7.  LV apical thrombus: on coumadin.  8.  [employer] dual chamber AICD (11/10)  Current Outpatient Prescriptions  Medication Sig Dispense Refill  . amoxicillin (AMOXIL) 500 MG capsule Take 4 tablets by mouth 2 hours before dental appt  4 capsule  0  . atorvastatin (LIPITOR) 80 MG tablet Take 1 tablet (80 mg total) by mouth daily.  90 tablet  3  . carvedilol (COREG) 25 MG tablet TAKE ONE TABLET BY MOUTH TWICE A DAY  60 tablet  6  . enalapril (VASOTEC) 10 MG tablet TAKE ONE TABLET BY MOUTH TWICE A DAY  60 tablet  3  . fish oil-omega-3 fatty acids 1000 MG capsule Take 2,400 mg by mouth.        . nitroGLYCERIN (NITROSTAT) 0.4 MG SL tablet Place 0.4 mg under the tongue every 5 (five) minutes as needed.        Marland Kitchen spironolactone (ALDACTONE) 25 MG tablet TAKE ONE TABLET BY MOUTH DAILY  30 tablet  3  . warfarin (COUMADIN) 5 MG tablet 1 tablet daily, as directed by coumadin clinic  90 tablet  4    BP 137/85  Pulse 79  Ht 5\' 10"  (1.778 m)  Wt 94.257 kg (207 lb 12.8 oz)  BMI 29.82 kg/m2 General:  Well developed, well nourished, in no acute distress. Neck:  Neck  supple, no JVD. No masses, thyromegaly or abnormal cervical nodes. Lungs:  Clear bilaterally to auscultation and percussion. Heart:  Non-displaced PMI, chest non-tender; regular rate and rhythm, S1, S2 without rubs or gallops.  No murmur.  Carotid upstroke normal, no bruit. Pedals normal pulses. No edema, no varicosities. Abdomen:  Bowel sounds positive; abdomen soft and non-tender without masses, organomegaly, or hernias noted. No hepatosplenomegaly. Extremities:  No clubbing or cyanosis. Neurologic:  Alert and oriented x 3. Psych:  Normal affect.

## 2011-12-14 NOTE — Progress Notes (Signed)
ICD remote 

## 2012-01-02 ENCOUNTER — Ambulatory Visit (INDEPENDENT_AMBULATORY_CARE_PROVIDER_SITE_OTHER): Payer: Self-pay

## 2012-01-02 DIAGNOSIS — I513 Intracardiac thrombosis, not elsewhere classified: Secondary | ICD-10-CM

## 2012-01-02 DIAGNOSIS — Z7901 Long term (current) use of anticoagulants: Secondary | ICD-10-CM

## 2012-01-02 DIAGNOSIS — I219 Acute myocardial infarction, unspecified: Secondary | ICD-10-CM

## 2012-01-02 DIAGNOSIS — I4891 Unspecified atrial fibrillation: Secondary | ICD-10-CM

## 2012-01-02 LAB — POCT INR: INR: 2.6

## 2012-01-06 ENCOUNTER — Other Ambulatory Visit: Payer: Self-pay | Admitting: Cardiology

## 2012-01-30 ENCOUNTER — Telehealth: Payer: Self-pay | Admitting: Cardiology

## 2012-01-30 NOTE — Telephone Encounter (Signed)
Left a message to call back.

## 2012-01-30 NOTE — Telephone Encounter (Signed)
Pt calling wanting to see if he can change to pravastatin instead of atorvastatin the cost is cheaper. Would like to have it written to take (2) 40's instead of (1) 80.

## 2012-01-30 NOTE — Telephone Encounter (Signed)
Patient is taken Atorvastatin 80 mg once a day. He would like to know if he can be change to Pravastatin instead because of the cost. This medication will be cheaper if prescribed 40 mg, take 2 tabs instead of one 80 mg if that is the equivalent for Atorvastatin.

## 2012-01-30 NOTE — Telephone Encounter (Signed)
Dr. Shirlee Latch states that Pravastatin was not as good as Atorvastatin to control his hyperlipidemia, and he needs to continue with Atorvastatin 80 mg once daily. Patient aware, he verbalized understanding.

## 2012-02-06 ENCOUNTER — Telehealth: Payer: Self-pay | Admitting: *Deleted

## 2012-02-06 MED ORDER — SPIRONOLACTONE 25 MG PO TABS: 25.0000 mg | ORAL_TABLET | Freq: Every day | ORAL | Status: DC

## 2012-02-06 MED ORDER — ENALAPRIL MALEATE 10 MG PO TABS: 10.0000 mg | ORAL_TABLET | Freq: Two times a day (BID) | ORAL | Status: DC

## 2012-02-06 MED ORDER — CARVEDILOL 25 MG PO TABS: 25.0000 mg | ORAL_TABLET | Freq: Two times a day (BID) | ORAL | Status: DC

## 2012-02-07 NOTE — Telephone Encounter (Signed)
sent 

## 2012-02-13 ENCOUNTER — Ambulatory Visit (INDEPENDENT_AMBULATORY_CARE_PROVIDER_SITE_OTHER): Payer: Self-pay

## 2012-02-13 DIAGNOSIS — I513 Intracardiac thrombosis, not elsewhere classified: Secondary | ICD-10-CM

## 2012-02-13 DIAGNOSIS — Z7901 Long term (current) use of anticoagulants: Secondary | ICD-10-CM

## 2012-02-13 DIAGNOSIS — I219 Acute myocardial infarction, unspecified: Secondary | ICD-10-CM

## 2012-02-13 DIAGNOSIS — I4891 Unspecified atrial fibrillation: Secondary | ICD-10-CM

## 2012-02-13 LAB — POCT INR: INR: 2.7

## 2012-03-06 ENCOUNTER — Encounter: Payer: Self-pay | Admitting: Internal Medicine

## 2012-03-06 ENCOUNTER — Ambulatory Visit (INDEPENDENT_AMBULATORY_CARE_PROVIDER_SITE_OTHER): Payer: Self-pay | Admitting: *Deleted

## 2012-03-06 DIAGNOSIS — Z95810 Presence of automatic (implantable) cardiac defibrillator: Secondary | ICD-10-CM

## 2012-03-06 DIAGNOSIS — I2589 Other forms of chronic ischemic heart disease: Secondary | ICD-10-CM

## 2012-03-14 LAB — REMOTE ICD DEVICE
AL AMPLITUDE: 3.9 mv
DEV-0020ICD: NEGATIVE
DEVICE MODEL ICD: 151503
FVT: 0
HV IMPEDENCE: 48 Ohm
PACEART VT: 0
RV LEAD IMPEDENCE ICD: 476 Ohm
TZAT-0001FASTVT: 1
TZAT-0001SLOWVT: 1
TZAT-0002SLOWVT: NEGATIVE
TZAT-0013FASTVT: 2
TZAT-0013FASTVT: 2
TZAT-0018FASTVT: NEGATIVE
TZAT-0018SLOWVT: NEGATIVE
TZAT-0018SLOWVT: NEGATIVE
TZON-0003FASTVT: 300 ms
TZST-0001FASTVT: 3
TZST-0001FASTVT: 4
TZST-0001FASTVT: 6
TZST-0001SLOWVT: 3
TZST-0001SLOWVT: 4
TZST-0001SLOWVT: 7
TZST-0002SLOWVT: NEGATIVE
TZST-0002SLOWVT: NEGATIVE
TZST-0002SLOWVT: NEGATIVE
TZST-0002SLOWVT: NEGATIVE
TZST-0003FASTVT: 31 J
TZST-0003FASTVT: 41 J
TZST-0003FASTVT: 41 J
TZST-0003FASTVT: 41 J
VENTRICULAR PACING ICD: 0 pct

## 2012-03-24 ENCOUNTER — Encounter: Payer: Self-pay | Admitting: *Deleted

## 2012-03-26 ENCOUNTER — Ambulatory Visit (INDEPENDENT_AMBULATORY_CARE_PROVIDER_SITE_OTHER): Payer: Medicare Other

## 2012-03-26 DIAGNOSIS — I513 Intracardiac thrombosis, not elsewhere classified: Secondary | ICD-10-CM

## 2012-03-26 DIAGNOSIS — Z7901 Long term (current) use of anticoagulants: Secondary | ICD-10-CM

## 2012-03-26 DIAGNOSIS — I4891 Unspecified atrial fibrillation: Secondary | ICD-10-CM

## 2012-03-26 DIAGNOSIS — I219 Acute myocardial infarction, unspecified: Secondary | ICD-10-CM

## 2012-04-09 ENCOUNTER — Ambulatory Visit (INDEPENDENT_AMBULATORY_CARE_PROVIDER_SITE_OTHER): Payer: Medicare Other

## 2012-04-09 DIAGNOSIS — I219 Acute myocardial infarction, unspecified: Secondary | ICD-10-CM

## 2012-04-09 DIAGNOSIS — Z7901 Long term (current) use of anticoagulants: Secondary | ICD-10-CM

## 2012-04-09 DIAGNOSIS — I513 Intracardiac thrombosis, not elsewhere classified: Secondary | ICD-10-CM

## 2012-04-09 DIAGNOSIS — I4891 Unspecified atrial fibrillation: Secondary | ICD-10-CM

## 2012-04-23 ENCOUNTER — Ambulatory Visit (INDEPENDENT_AMBULATORY_CARE_PROVIDER_SITE_OTHER): Payer: Medicare Other | Admitting: *Deleted

## 2012-04-23 DIAGNOSIS — Z7901 Long term (current) use of anticoagulants: Secondary | ICD-10-CM

## 2012-04-23 DIAGNOSIS — I219 Acute myocardial infarction, unspecified: Secondary | ICD-10-CM

## 2012-04-23 DIAGNOSIS — I513 Intracardiac thrombosis, not elsewhere classified: Secondary | ICD-10-CM

## 2012-04-23 DIAGNOSIS — I4891 Unspecified atrial fibrillation: Secondary | ICD-10-CM

## 2012-05-07 ENCOUNTER — Ambulatory Visit (INDEPENDENT_AMBULATORY_CARE_PROVIDER_SITE_OTHER): Payer: Medicare Other

## 2012-05-07 DIAGNOSIS — I4891 Unspecified atrial fibrillation: Secondary | ICD-10-CM

## 2012-05-07 DIAGNOSIS — I513 Intracardiac thrombosis, not elsewhere classified: Secondary | ICD-10-CM

## 2012-05-07 DIAGNOSIS — I219 Acute myocardial infarction, unspecified: Secondary | ICD-10-CM

## 2012-05-07 DIAGNOSIS — Z7901 Long term (current) use of anticoagulants: Secondary | ICD-10-CM

## 2012-05-07 LAB — POCT INR: INR: 3.3

## 2012-05-12 ENCOUNTER — Other Ambulatory Visit: Payer: Self-pay | Admitting: Cardiovascular Disease

## 2012-05-12 MED ORDER — ATORVASTATIN CALCIUM 80 MG PO TABS: 80.0000 mg | ORAL_TABLET | Freq: Every day | ORAL | Status: DC

## 2012-05-12 NOTE — Telephone Encounter (Signed)
Refilled Atorvastatin.

## 2012-05-27 ENCOUNTER — Encounter: Payer: Self-pay | Admitting: *Deleted

## 2012-05-28 ENCOUNTER — Ambulatory Visit (INDEPENDENT_AMBULATORY_CARE_PROVIDER_SITE_OTHER): Payer: Medicare Other

## 2012-05-28 DIAGNOSIS — I4891 Unspecified atrial fibrillation: Secondary | ICD-10-CM

## 2012-05-28 DIAGNOSIS — I219 Acute myocardial infarction, unspecified: Secondary | ICD-10-CM

## 2012-05-28 DIAGNOSIS — I513 Intracardiac thrombosis, not elsewhere classified: Secondary | ICD-10-CM

## 2012-05-28 DIAGNOSIS — Z7901 Long term (current) use of anticoagulants: Secondary | ICD-10-CM

## 2012-05-28 LAB — POCT INR: INR: 2.8

## 2012-06-05 ENCOUNTER — Encounter: Payer: Self-pay | Admitting: Internal Medicine

## 2012-06-05 ENCOUNTER — Ambulatory Visit (INDEPENDENT_AMBULATORY_CARE_PROVIDER_SITE_OTHER): Payer: Medicare Other | Admitting: Internal Medicine

## 2012-06-05 VITALS — BP 121/75 | HR 80 | Ht 70.0 in | Wt 197.5 lb

## 2012-06-05 DIAGNOSIS — I4891 Unspecified atrial fibrillation: Secondary | ICD-10-CM

## 2012-06-05 DIAGNOSIS — I2589 Other forms of chronic ischemic heart disease: Secondary | ICD-10-CM

## 2012-06-05 DIAGNOSIS — Z95810 Presence of automatic (implantable) cardiac defibrillator: Secondary | ICD-10-CM

## 2012-06-05 LAB — ICD DEVICE OBSERVATION
AL IMPEDENCE ICD: 610 Ohm
ATRIAL PACING ICD: 1 pct
RV LEAD IMPEDENCE ICD: 545 Ohm
RV LEAD THRESHOLD: 0.8 V
TZAT-0001FASTVT: 1
TZAT-0001SLOWVT: 1
TZAT-0002SLOWVT: NEGATIVE
TZAT-0013FASTVT: 2
TZAT-0013FASTVT: 2
TZAT-0018FASTVT: NEGATIVE
TZAT-0018SLOWVT: NEGATIVE
TZAT-0018SLOWVT: NEGATIVE
TZON-0003FASTVT: 300 ms
TZON-0003SLOWVT: 375 ms
TZST-0001FASTVT: 3
TZST-0001FASTVT: 4
TZST-0001FASTVT: 5
TZST-0001FASTVT: 6
TZST-0001SLOWVT: 3
TZST-0001SLOWVT: 6
TZST-0001SLOWVT: 7
TZST-0002SLOWVT: NEGATIVE
TZST-0002SLOWVT: NEGATIVE
TZST-0002SLOWVT: NEGATIVE
TZST-0003FASTVT: 41 J
TZST-0003FASTVT: 41 J
TZST-0003FASTVT: 41 J
TZST-0003FASTVT: 41 J
VENTRICULAR PACING ICD: 1 pct

## 2012-06-05 NOTE — Assessment & Plan Note (Signed)
No intercurrent Atrial fibrillation or flutter  

## 2012-06-05 NOTE — Progress Notes (Signed)
  HPI  [name] [name] is a 51 y.o. male followup for an ICD implanted for primary prevention in the setting of ischemic heart disease prior bypass surgery and ejection fraction of 20-25%. He also has an apical thrombus for which he takes Coumadin.  He was enrolled in the MADIT-R IT trial. He's had no problems with his defibrillator since implantation.    Echo was reviewed from 6/11, showing EF 40% (improved) with dyskinetic apex.   The patient denies chest pain, shortness of breath, nocturnal dyspnea, orthopnea or peripheral edema.  There have been no palpitations, lightheadedness or syncope.       Past Medical History  Diagnosis Date  . Hyperlipidemia   . Coronary artery disease     s/p CABG; EF 2011 40-45%  . Atrial fibrillation   . Post-infarction apical thrombus     on coumadin  . Cardiac defibrillator in situ     MADIT RIT trial    Past Surgical History  Procedure Date  . Coronary artery bypass graft   . Cardiac defibrillator placement     [employer]  . Knee surgery     Current Outpatient Prescriptions  Medication Sig Dispense Refill  . atorvastatin (LIPITOR) 80 MG tablet Take 1 tablet (80 mg total) by mouth daily.  90 tablet  3  . carvedilol (COREG) 25 MG tablet Take 1 tablet (25 mg total) by mouth 2 (two) times daily with a meal.  180 tablet  1  . enalapril (VASOTEC) 10 MG tablet Take 1 tablet (10 mg total) by mouth 2 (two) times daily.  180 tablet  1  . nitroGLYCERIN (NITROSTAT) 0.4 MG SL tablet Place 0.4 mg under the tongue every 5 (five) minutes as needed.        . Omega-3 Fatty Acids (FISH OIL) 1200 MG CAPS Take by mouth daily.      Marland Kitchen spironolactone (ALDACTONE) 25 MG tablet Take 1 tablet (25 mg total) by mouth daily.  90 tablet  1  . warfarin (COUMADIN) 5 MG tablet 1 tablet daily, as directed by coumadin clinic  90 tablet  4    No Known Allergies  Review of Systems negative except from HPI and PMH  Physical Exam BP 121/75  Pulse 80  Ht 5\' 10"  (1.778  m)  Wt 197 lb 8 oz (89.585 kg)  BMI 28.34 kg/m2 Well developed and well nourished in no acute distress HENT normal E scleral and icterus clear Neck Supple JVP flat; carotids brisk and full Clear to ausculation Device pocket well healed; without hematoma or erythema Regular rate and rhythm, no murmurs gallops or rub Soft with active bowel sounds No clubbing cyanosis none Edema Alert and oriented, grossly normal motor and sensory function Skin Warm and Dry    Assessment and  Plan

## 2012-06-05 NOTE — Assessment & Plan Note (Signed)
The patient's device was interrogated.  The information was reviewed. No changes were made in the programming.    

## 2012-06-05 NOTE — Assessment & Plan Note (Signed)
stable °

## 2012-06-05 NOTE — Patient Instructions (Addendum)
Please send a latitude transmission in on 09/11/12.  Your physician wants you to follow-up in: 1 year with Dr. Graciela Husbands. You will receive a reminder letter in the mail two months in advance. If you don't receive a letter, please call our office to schedule the follow-up appointment.

## 2012-06-25 ENCOUNTER — Ambulatory Visit (INDEPENDENT_AMBULATORY_CARE_PROVIDER_SITE_OTHER): Payer: Medicare Other

## 2012-06-25 DIAGNOSIS — I4891 Unspecified atrial fibrillation: Secondary | ICD-10-CM

## 2012-06-25 DIAGNOSIS — I513 Intracardiac thrombosis, not elsewhere classified: Secondary | ICD-10-CM

## 2012-06-25 DIAGNOSIS — I219 Acute myocardial infarction, unspecified: Secondary | ICD-10-CM

## 2012-06-25 DIAGNOSIS — Z7901 Long term (current) use of anticoagulants: Secondary | ICD-10-CM

## 2012-06-25 LAB — POCT INR: INR: 3.3

## 2012-07-23 ENCOUNTER — Ambulatory Visit (INDEPENDENT_AMBULATORY_CARE_PROVIDER_SITE_OTHER): Payer: Medicare Other

## 2012-07-23 DIAGNOSIS — I513 Intracardiac thrombosis, not elsewhere classified: Secondary | ICD-10-CM

## 2012-07-23 DIAGNOSIS — Z7901 Long term (current) use of anticoagulants: Secondary | ICD-10-CM

## 2012-07-23 DIAGNOSIS — I4891 Unspecified atrial fibrillation: Secondary | ICD-10-CM

## 2012-07-23 DIAGNOSIS — I219 Acute myocardial infarction, unspecified: Secondary | ICD-10-CM

## 2012-08-04 ENCOUNTER — Other Ambulatory Visit: Payer: Self-pay | Admitting: Cardiology

## 2012-08-06 ENCOUNTER — Telehealth: Payer: Self-pay | Admitting: Cardiology

## 2012-08-06 NOTE — Telephone Encounter (Signed)
New Problem:   I called the patient and was unable to reach them. I left a message on their voicemail with my name, the reason I called, the name of his physician, and a number to call back to schedule their appointment with fasting lab work.

## 2012-08-20 ENCOUNTER — Ambulatory Visit (INDEPENDENT_AMBULATORY_CARE_PROVIDER_SITE_OTHER): Payer: Medicare Other

## 2012-08-20 DIAGNOSIS — I4891 Unspecified atrial fibrillation: Secondary | ICD-10-CM

## 2012-08-20 DIAGNOSIS — I219 Acute myocardial infarction, unspecified: Secondary | ICD-10-CM

## 2012-08-20 DIAGNOSIS — Z7901 Long term (current) use of anticoagulants: Secondary | ICD-10-CM

## 2012-08-20 DIAGNOSIS — I513 Intracardiac thrombosis, not elsewhere classified: Secondary | ICD-10-CM

## 2012-09-10 ENCOUNTER — Ambulatory Visit (INDEPENDENT_AMBULATORY_CARE_PROVIDER_SITE_OTHER): Payer: Medicare Other

## 2012-09-10 DIAGNOSIS — I219 Acute myocardial infarction, unspecified: Secondary | ICD-10-CM

## 2012-09-10 DIAGNOSIS — I513 Intracardiac thrombosis, not elsewhere classified: Secondary | ICD-10-CM

## 2012-09-10 DIAGNOSIS — Z7901 Long term (current) use of anticoagulants: Secondary | ICD-10-CM

## 2012-09-10 DIAGNOSIS — I4891 Unspecified atrial fibrillation: Secondary | ICD-10-CM

## 2012-09-10 LAB — POCT INR: INR: 2.3

## 2012-09-11 ENCOUNTER — Encounter: Payer: Medicare Other | Admitting: *Deleted

## 2012-10-08 ENCOUNTER — Ambulatory Visit (INDEPENDENT_AMBULATORY_CARE_PROVIDER_SITE_OTHER): Payer: Medicare Other

## 2012-10-08 DIAGNOSIS — I219 Acute myocardial infarction, unspecified: Secondary | ICD-10-CM

## 2012-10-08 DIAGNOSIS — Z7901 Long term (current) use of anticoagulants: Secondary | ICD-10-CM

## 2012-10-08 DIAGNOSIS — I4891 Unspecified atrial fibrillation: Secondary | ICD-10-CM

## 2012-10-08 DIAGNOSIS — I513 Intracardiac thrombosis, not elsewhere classified: Secondary | ICD-10-CM

## 2012-10-12 ENCOUNTER — Other Ambulatory Visit: Payer: Self-pay | Admitting: Cardiology

## 2012-10-21 ENCOUNTER — Encounter: Payer: Self-pay | Admitting: *Deleted

## 2012-10-23 ENCOUNTER — Encounter: Payer: Self-pay | Admitting: Cardiology

## 2012-10-23 ENCOUNTER — Ambulatory Visit (INDEPENDENT_AMBULATORY_CARE_PROVIDER_SITE_OTHER): Payer: Medicare Other | Admitting: Cardiology

## 2012-10-23 VITALS — BP 110/68 | HR 74 | Ht 70.0 in | Wt 197.0 lb

## 2012-10-23 DIAGNOSIS — E785 Hyperlipidemia, unspecified: Secondary | ICD-10-CM

## 2012-10-23 DIAGNOSIS — I219 Acute myocardial infarction, unspecified: Secondary | ICD-10-CM

## 2012-10-23 DIAGNOSIS — I5022 Chronic systolic (congestive) heart failure: Secondary | ICD-10-CM

## 2012-10-23 DIAGNOSIS — I25810 Atherosclerosis of coronary artery bypass graft(s) without angina pectoris: Secondary | ICD-10-CM

## 2012-10-23 DIAGNOSIS — I2589 Other forms of chronic ischemic heart disease: Secondary | ICD-10-CM

## 2012-10-23 DIAGNOSIS — I513 Intracardiac thrombosis, not elsewhere classified: Secondary | ICD-10-CM

## 2012-10-23 LAB — BASIC METABOLIC PANEL
CO2: 25 mEq/L (ref 19–32)
Calcium: 8.9 mg/dL (ref 8.4–10.5)
Creatinine, Ser: 1 mg/dL (ref 0.4–1.5)
GFR: 81.83 mL/min (ref >60.00)
Sodium: 135 mEq/L (ref 135–145)

## 2012-10-23 LAB — LIPID PANEL
HDL: 35.7 mg/dL — ABNORMAL LOW (ref >39.00)
LDL Cholesterol: 54 mg/dL (ref 0–99)
Total CHOL/HDL Ratio: 3
Triglycerides: 118 mg/dL (ref 0.0–149.0)
VLDL: 23.6 mg/dL (ref 0.0–40.0)

## 2012-10-23 MED ORDER — ENALAPRIL MALEATE 10 MG PO TABS: ORAL_TABLET | ORAL | Status: DC

## 2012-10-23 NOTE — Patient Instructions (Addendum)
Increase enalapril to 15mg  two times a day. This will be 1 and 1/2 of a 10mg  tablet two times a day.  Your physician recommends that you have  lab work today--BMET/Lipid.  Your physician recommends that you return for lab work in: 2-3 weeks. This can be done in the [facility].   Your physician recommends that you return for lab work every 3 months--BMET. This can be done in the [place] office.   Your physician wants you to follow-up in: 6 months with Dr Shirlee Latch. (June 2014).  You will receive a reminder letter in the mail two months in advance. If you don't receive a letter, please call our office to schedule the follow-up appointment.

## 2012-10-23 NOTE — Progress Notes (Signed)
Patient ID: [name] [name], male   DOB: Sep 02, 1961, 52 y.o.   MRN: [id number] PCP: Esperanza Sheets PA [facility])  52 yo with h/o CAD s/p CABG and ischemic cardiomyopathy as well as LV apical thrombus returns for followup.  He is doing quite well with minimal symptoms.  No chest pain. No exertional dyspnea.   He is tolerating all his meds. [place].  Echo from 6/11 showed EF 40% (improved) with dyskinetic apex.  Weight is down 10 lbs since last appointment.  He is walking and using his Bowflex for exercise.   ECG: NSR, old anterior MI with anterolateral T wave inversions  Labs (12/10): creatinine 0.85 Labs (4/11): K 4.5, creatinine 0.87, LDL 71, HDL 36 Labs (11/11): LDL 83, HDL 34 Labs (5/12): K 4.1, creatinine 0.95, LDL 60, HDL 36, K 4.1, creatinine 4.09 Labs (12/12): K 4.2, creatinine 0.82, LDL 73, HDL 43 Labs (8/13): LDL 75, HDL 42, K 4.4, creatinine 8.11  Allergies (verified):  No Known Drug Allergies  Family History: Father with MI at 66 Mother with CHF Hyperlipidemia runs in family  Social History: Lives with wife in [place].  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  Originally from [place].  Smoked 1 ppd x years, quit 7/10.  Occasional ETOH.  No drugs.   ROS:  All systems reviewed and negative except as per HPI.   Past Medical History: 1.  Hyperlipidemia 2.  Smoking: quit 7/10 3.  CAD:  LHC (7/10) with EF 35%, RCA totally occluded, ostial LAD totally occluded, mid CFX totally occluded.  Large OM1 with collaterals to the LAD and RCA.  CABG with LIMA-LAD, RIMA-RCA.   4.  Moderate MR: MV repair/annuloplasty ring at time of CABG (7/10). 5.  Ischemic CMP: echo (7/10) with mild LV dilatation, EF 25-30%.  Anteroseptal, inferoseptal, inferior, and apical akinesis.  RV normal.  Moderate MR.  Repeat echo (10/10) EF 20%, mild to moderately dilated, dyskinetic apex, akinetic septum,  severe hypokinesis of the inferior and anterior walls. Mild MR.  LV apical thrombus.  Repeat echo (6/11): EF 40%, moderately dilated LV, mild LVH, apical anterior wall/apical septal wall/apex dyskinetic, s/p MV repair with no MS or MR. LV apical thrombus not seen on this echo.  6.  Atrial fibrillation: post-op CABG.  7.  LV apical thrombus: on coumadin.  8.  [employer] dual chamber AICD (11/10)  Current Outpatient Prescriptions  Medication Sig Dispense Refill  . atorvastatin (LIPITOR) 80 MG tablet Take 1 tablet (80 mg total) by mouth daily.  90 tablet  3  . carvedilol (COREG) 25 MG tablet Take 1 tablet (25 mg total) by mouth 2 (two) times daily with a meal.  180 tablet  1  . enalapril (VASOTEC) 10 MG tablet TAKE ONE TABLET BY MOUTH TWICE A DAY  180 tablet  1  . nitroGLYCERIN (NITROSTAT) 0.4 MG SL tablet Place 0.4 mg under the tongue every 5 (five) minutes as needed.        . Omega-3 Fatty Acids (FISH OIL) 1200 MG CAPS Take 2,400 mg by mouth daily.       Marland Kitchen spironolactone (ALDACTONE) 25 MG tablet TAKE 1 TABLET (25 MG TOTAL) BY MOUTH DAILY.  90 tablet  1  . warfarin (COUMADIN) 5 MG tablet 1 tablet daily, as directed by coumadin clinic  90 tablet  4    BP 110/68  Pulse 74  Ht 5\' 10"  (1.778 m)  Wt 197 lb (89.359 kg)  BMI 28.27 kg/m2 General:  Well developed, well nourished, in no acute distress. Neck:  Neck supple, no JVD. No masses, thyromegaly or abnormal cervical nodes. Lungs:  Clear bilaterally to auscultation and percussion. Heart:  Non-displaced PMI, chest non-tender; regular rate and rhythm, S1, S2 without rubs or gallops.  No murmur.  Carotid upstroke normal, no bruit. Pedals normal pulses. No edema, no varicosities. Abdomen:  Bowel sounds positive; abdomen soft and non-tender without masses, organomegaly, or hernias noted. No hepatosplenomegaly. Extremities:  No clubbing or cyanosis. Neurologic:  Alert and oriented x 3. Psych:  Normal affect.  Assessment/Plan:  CAD,  AUTOLOGOUS BYPASS GRAFT  No ischemic symptoms.  Continue ASA, statin, ACEI, beta blocker.  Continue exercise. CARDIOMYOPATHY, ISCHEMIC  NYHA class II symptoms at this time. He is doing very well and is active. Continue current doses of carvedilol and spironolactone. EF improved to 40% on last echo. He has an ICD.  - Increase enalapril to 15 mg bid with in 2 wks.  - Needs more frequent K and creatinine checks given spironolactone use.  I will arrange for q 3 month BMETs at our [place] office when he gets his INR checked.  Left ventricular thrombus  Not seen on last echo. Continue coumadin for now.  Hyperlipidemia Continue atorvastatin.  Check lipids today.   Marca Ancona 10/23/2012 9:02 AM

## 2012-11-05 ENCOUNTER — Ambulatory Visit (INDEPENDENT_AMBULATORY_CARE_PROVIDER_SITE_OTHER): Payer: Medicare Other

## 2012-11-05 DIAGNOSIS — I513 Intracardiac thrombosis, not elsewhere classified: Secondary | ICD-10-CM

## 2012-11-05 DIAGNOSIS — I4891 Unspecified atrial fibrillation: Secondary | ICD-10-CM

## 2012-11-05 DIAGNOSIS — Z7901 Long term (current) use of anticoagulants: Secondary | ICD-10-CM

## 2012-11-05 DIAGNOSIS — I219 Acute myocardial infarction, unspecified: Secondary | ICD-10-CM

## 2012-11-14 ENCOUNTER — Encounter: Payer: Self-pay | Admitting: *Deleted

## 2012-12-03 ENCOUNTER — Ambulatory Visit (INDEPENDENT_AMBULATORY_CARE_PROVIDER_SITE_OTHER): Payer: Medicare Other

## 2012-12-03 DIAGNOSIS — I513 Intracardiac thrombosis, not elsewhere classified: Secondary | ICD-10-CM

## 2012-12-03 DIAGNOSIS — I4891 Unspecified atrial fibrillation: Secondary | ICD-10-CM

## 2012-12-03 DIAGNOSIS — Z7901 Long term (current) use of anticoagulants: Secondary | ICD-10-CM

## 2012-12-03 DIAGNOSIS — I219 Acute myocardial infarction, unspecified: Secondary | ICD-10-CM

## 2012-12-03 LAB — POCT INR: INR: 2

## 2012-12-11 ENCOUNTER — Other Ambulatory Visit: Payer: Self-pay

## 2012-12-11 ENCOUNTER — Ambulatory Visit (INDEPENDENT_AMBULATORY_CARE_PROVIDER_SITE_OTHER): Payer: Medicare Other | Admitting: *Deleted

## 2012-12-11 DIAGNOSIS — I2589 Other forms of chronic ischemic heart disease: Secondary | ICD-10-CM

## 2012-12-11 DIAGNOSIS — Z95810 Presence of automatic (implantable) cardiac defibrillator: Secondary | ICD-10-CM

## 2012-12-15 LAB — REMOTE ICD DEVICE
AL AMPLITUDE: 4 mv
ATRIAL PACING ICD: 0 pct
CHARGE TIME: 9.4 s
DEV-0020ICD: NEGATIVE
HV IMPEDENCE: 49 Ohm
RV LEAD AMPLITUDE: 25 mv
TOT-0006: 20131121000000
TZAT-0001FASTVT: 1
TZAT-0001FASTVT: 2
TZAT-0001SLOWVT: 2
TZAT-0013FASTVT: 2
TZAT-0018FASTVT: NEGATIVE
TZAT-0018SLOWVT: NEGATIVE
TZST-0001FASTVT: 4
TZST-0001FASTVT: 5
TZST-0001FASTVT: 8
TZST-0001SLOWVT: 3
TZST-0001SLOWVT: 4
TZST-0001SLOWVT: 7
TZST-0002SLOWVT: NEGATIVE
TZST-0002SLOWVT: NEGATIVE
TZST-0002SLOWVT: NEGATIVE
TZST-0003FASTVT: 31 J
TZST-0003FASTVT: 41 J
TZST-0003FASTVT: 41 J
TZST-0003FASTVT: 41 J
VENTRICULAR PACING ICD: 0 pct

## 2013-01-01 ENCOUNTER — Encounter: Payer: Self-pay | Admitting: Internal Medicine

## 2013-01-21 ENCOUNTER — Ambulatory Visit (INDEPENDENT_AMBULATORY_CARE_PROVIDER_SITE_OTHER): Payer: Medicare Other

## 2013-01-21 DIAGNOSIS — Z7901 Long term (current) use of anticoagulants: Secondary | ICD-10-CM

## 2013-01-21 DIAGNOSIS — I513 Intracardiac thrombosis, not elsewhere classified: Secondary | ICD-10-CM

## 2013-01-21 DIAGNOSIS — I4891 Unspecified atrial fibrillation: Secondary | ICD-10-CM

## 2013-01-21 DIAGNOSIS — I219 Acute myocardial infarction, unspecified: Secondary | ICD-10-CM

## 2013-01-21 LAB — POCT INR: INR: 2.5

## 2013-02-09 ENCOUNTER — Other Ambulatory Visit: Payer: Self-pay | Admitting: *Deleted

## 2013-02-09 ENCOUNTER — Other Ambulatory Visit: Payer: Self-pay | Admitting: Cardiology

## 2013-02-09 MED ORDER — CARVEDILOL 25 MG PO TABS: 25.0000 mg | ORAL_TABLET | Freq: Two times a day (BID) | ORAL | Status: DC

## 2013-02-09 NOTE — Telephone Encounter (Signed)
Pt needs refill called into [facility], he is completely out of the medication

## 2013-03-04 ENCOUNTER — Ambulatory Visit (INDEPENDENT_AMBULATORY_CARE_PROVIDER_SITE_OTHER): Payer: Medicare Other

## 2013-03-04 DIAGNOSIS — I513 Intracardiac thrombosis, not elsewhere classified: Secondary | ICD-10-CM

## 2013-03-04 DIAGNOSIS — I219 Acute myocardial infarction, unspecified: Secondary | ICD-10-CM

## 2013-03-04 DIAGNOSIS — Z7901 Long term (current) use of anticoagulants: Secondary | ICD-10-CM

## 2013-03-04 DIAGNOSIS — I4891 Unspecified atrial fibrillation: Secondary | ICD-10-CM

## 2013-03-04 LAB — POCT INR: INR: 2.4

## 2013-03-12 ENCOUNTER — Encounter: Payer: Medicare Other | Admitting: *Deleted

## 2013-04-29 ENCOUNTER — Ambulatory Visit (INDEPENDENT_AMBULATORY_CARE_PROVIDER_SITE_OTHER): Payer: Medicare Other

## 2013-04-29 DIAGNOSIS — I219 Acute myocardial infarction, unspecified: Secondary | ICD-10-CM

## 2013-04-29 DIAGNOSIS — I513 Intracardiac thrombosis, not elsewhere classified: Secondary | ICD-10-CM

## 2013-04-29 DIAGNOSIS — I4891 Unspecified atrial fibrillation: Secondary | ICD-10-CM

## 2013-04-29 DIAGNOSIS — Z7901 Long term (current) use of anticoagulants: Secondary | ICD-10-CM

## 2013-05-06 ENCOUNTER — Other Ambulatory Visit: Payer: Self-pay | Admitting: Cardiovascular Disease

## 2013-05-06 NOTE — Telephone Encounter (Signed)
[place] patient.

## 2013-07-01 ENCOUNTER — Other Ambulatory Visit: Payer: Self-pay | Admitting: Cardiovascular Disease

## 2013-07-01 NOTE — Telephone Encounter (Signed)
Ref[facility] 

## 2013-07-29 ENCOUNTER — Telehealth: Payer: Self-pay

## 2013-07-29 NOTE — Telephone Encounter (Signed)
Attempted to contact pt.  Phone # disconnected.  Overdue for Coumadin follow-up.  Unable to contact pt at phone #s in chart.  Mailed delinquent letter to pt's home address. Pt's last Warfarin rx was 03/2011 to [facility].  No active Coumadin rx on filed to pull refills on from our office.

## 2013-08-18 ENCOUNTER — Telehealth: Payer: Self-pay

## 2013-08-18 NOTE — Telephone Encounter (Signed)
Received return mail for this pt. Also tried all 3 #'s home # disconnected, cell Vm not set up. Work # wrong #.

## 2013-10-19 ENCOUNTER — Other Ambulatory Visit: Payer: Self-pay | Admitting: *Deleted

## 2013-10-19 MED ORDER — ENALAPRIL MALEATE 20 MG PO TABS: ORAL_TABLET | ORAL | Status: DC

## 2013-10-19 NOTE — Telephone Encounter (Signed)
2015 formulary change on enalapril

## 2014-01-12 ENCOUNTER — Encounter: Payer: Self-pay | Admitting: *Deleted

## 2014-01-27 ENCOUNTER — Telehealth: Payer: Self-pay

## 2014-01-27 NOTE — Telephone Encounter (Signed)
Request from [place], sent to [place] on 01/28/2014.

## 2014-01-28 ENCOUNTER — Telehealth: Payer: Self-pay | Admitting: Internal Medicine

## 2014-01-28 ENCOUNTER — Encounter: Payer: Self-pay | Admitting: Internal Medicine

## 2014-01-28 ENCOUNTER — Telehealth: Payer: Self-pay

## 2014-01-28 NOTE — Telephone Encounter (Signed)
Attempted to call pt to inform of disability reques,t and the charge he will incur, being sent to [employer]. All 3 #'s unable to reach.

## 2014-01-28 NOTE — Telephone Encounter (Signed)
Mailed release package to pt for [employer]

## 2014-01-28 NOTE — Telephone Encounter (Signed)
01-28-14 sent past due letter for defib check with Dr. Caryl Comes, was due August 2014/mt

## 2014-03-22 ENCOUNTER — Encounter: Payer: Self-pay | Admitting: Cardiology

## 2014-03-23 ENCOUNTER — Telehealth: Payer: Self-pay | Admitting: Internal Medicine

## 2014-03-23 NOTE — Telephone Encounter (Signed)
03-22-14 sent past due device check letter certified/mt ° °

## 2014-08-12 ENCOUNTER — Telehealth: Payer: Self-pay | Admitting: *Deleted

## 2014-08-12 NOTE — Telephone Encounter (Signed)
No voicemail set up. We need current address on patient.

## 2015-09-02 DIAGNOSIS — I10 Essential (primary) hypertension: Secondary | ICD-10-CM | POA: Diagnosis not present

## 2015-09-02 DIAGNOSIS — I2510 Atherosclerotic heart disease of native coronary artery without angina pectoris: Secondary | ICD-10-CM | POA: Diagnosis not present

## 2015-09-02 DIAGNOSIS — Z125 Encounter for screening for malignant neoplasm of prostate: Secondary | ICD-10-CM | POA: Diagnosis not present

## 2015-09-02 DIAGNOSIS — E782 Mixed hyperlipidemia: Secondary | ICD-10-CM | POA: Diagnosis not present

## 2015-09-02 DIAGNOSIS — Z952 Presence of prosthetic heart valve: Secondary | ICD-10-CM | POA: Diagnosis not present

## 2015-09-02 DIAGNOSIS — Z7901 Long term (current) use of anticoagulants: Secondary | ICD-10-CM | POA: Diagnosis not present

## 2015-09-02 DIAGNOSIS — Z0001 Encounter for general adult medical examination with abnormal findings: Secondary | ICD-10-CM | POA: Diagnosis not present

## 2015-09-02 DIAGNOSIS — F17210 Nicotine dependence, cigarettes, uncomplicated: Secondary | ICD-10-CM | POA: Diagnosis not present

## 2015-12-06 DIAGNOSIS — Z7901 Long term (current) use of anticoagulants: Secondary | ICD-10-CM | POA: Diagnosis not present

## 2016-01-11 ENCOUNTER — Other Ambulatory Visit: Payer: Self-pay | Admitting: Internal Medicine

## 2016-01-11 DIAGNOSIS — I513 Intracardiac thrombosis, not elsewhere classified: Secondary | ICD-10-CM

## 2016-01-13 ENCOUNTER — Ambulatory Visit: Payer: Self-pay

## 2016-02-02 ENCOUNTER — Ambulatory Visit
Admission: RE | Admit: 2016-02-02 | Discharge: 2016-02-02 | Disposition: A | Discharge status: Home / Self Care | Payer: Commercial Managed Care - HMO | Source: Ambulatory Visit | Attending: Internal Medicine | Admitting: Internal Medicine

## 2016-02-02 DIAGNOSIS — I4891 Unspecified atrial fibrillation: Secondary | ICD-10-CM | POA: Insufficient documentation

## 2016-02-02 DIAGNOSIS — E785 Hyperlipidemia, unspecified: Secondary | ICD-10-CM | POA: Diagnosis not present

## 2016-02-02 DIAGNOSIS — I513 Intracardiac thrombosis, not elsewhere classified: Secondary | ICD-10-CM | POA: Insufficient documentation

## 2016-02-02 DIAGNOSIS — I2510 Atherosclerotic heart disease of native coronary artery without angina pectoris: Secondary | ICD-10-CM | POA: Diagnosis not present

## 2016-02-02 NOTE — Progress Notes (Signed)
*  PRELIMINARY RESULTS* Echocardiogram 2D Echocardiogram has been performed.  Laqueta Jean Hege 02/02/2016, 11:43 AM

## 2016-02-28 ENCOUNTER — Encounter: Admission: RE | Payer: Self-pay | Source: Ambulatory Visit

## 2016-02-28 ENCOUNTER — Ambulatory Visit
Admission: RE | Admit: 2016-02-28 | Payer: Commercial Managed Care - HMO | Source: Ambulatory Visit | Admitting: Gastroenterology

## 2016-02-28 SURGERY — COLONOSCOPY WITH PROPOFOL
Anesthesia: General

## 2016-03-01 DIAGNOSIS — I2510 Atherosclerotic heart disease of native coronary artery without angina pectoris: Secondary | ICD-10-CM | POA: Diagnosis not present

## 2016-03-01 DIAGNOSIS — Z7901 Long term (current) use of anticoagulants: Secondary | ICD-10-CM | POA: Diagnosis not present

## 2016-03-01 DIAGNOSIS — Z952 Presence of prosthetic heart valve: Secondary | ICD-10-CM | POA: Diagnosis not present

## 2016-03-01 DIAGNOSIS — I5040 Unspecified combined systolic (congestive) and diastolic (congestive) heart failure: Secondary | ICD-10-CM | POA: Diagnosis not present

## 2016-03-01 DIAGNOSIS — I10 Essential (primary) hypertension: Secondary | ICD-10-CM | POA: Diagnosis not present

## 2016-03-29 ENCOUNTER — Ambulatory Visit: Payer: Self-pay | Admitting: Cardiovascular Disease

## 2016-04-02 DIAGNOSIS — Z952 Presence of prosthetic heart valve: Secondary | ICD-10-CM | POA: Diagnosis not present

## 2016-04-02 DIAGNOSIS — Z7901 Long term (current) use of anticoagulants: Secondary | ICD-10-CM | POA: Diagnosis not present

## 2016-05-01 ENCOUNTER — Ambulatory Visit (INDEPENDENT_AMBULATORY_CARE_PROVIDER_SITE_OTHER): Payer: Commercial Managed Care - HMO | Admitting: Cardiovascular Disease

## 2016-05-01 ENCOUNTER — Telehealth: Payer: Self-pay | Admitting: Cardiovascular Disease

## 2016-05-01 ENCOUNTER — Telehealth: Payer: Self-pay | Admitting: Internal Medicine

## 2016-05-01 ENCOUNTER — Encounter: Payer: Self-pay | Admitting: Cardiovascular Disease

## 2016-05-01 VITALS — BP 124/76 | HR 96 | Ht 71.0 in | Wt 187.5 lb

## 2016-05-01 DIAGNOSIS — I4891 Unspecified atrial fibrillation: Secondary | ICD-10-CM | POA: Diagnosis not present

## 2016-05-01 DIAGNOSIS — Z95810 Presence of automatic (implantable) cardiac defibrillator: Secondary | ICD-10-CM | POA: Diagnosis not present

## 2016-05-01 MED ORDER — ASPIRIN EC 81 MG PO TBEC: 81.0000 mg | DELAYED_RELEASE_TABLET | Freq: Every day | ORAL | Status: DC

## 2016-05-01 NOTE — Patient Instructions (Addendum)
Medication Instructions:  Your physician has recommended you make the following change in your medication:  STOP taking coumadin START taking aspirin 81mg  once daily. You may hold aspirin 1 week prior to colonoscopy.   Labwork: none  Testing/Procedures: none  Follow-Up: Your physician wants you to follow-up in: six months with Dr. Fletcher Anon.  You will receive a reminder letter in the mail two months in advance. If you don't receive a letter, please call our office to schedule the follow-up appointment.   Any Other Special Instructions Will Be Listed Below (If Applicable). Refer to Dr. Caryl Comes - device check     If you need a refill on your cardiac medications before your next appointment, please call your pharmacy.

## 2016-05-01 NOTE — Telephone Encounter (Signed)
lmov to let patient be aware of Appointment with Dr Caryl Comes next week Per Dr Fletcher Anon.

## 2016-05-01 NOTE — Progress Notes (Signed)
Cardiology Office Note   Date:  05/01/2016   ID:  [name] [name], DOB 1961-07-21, MRN [id number]  PCP:  Lavera Guise, MD  Cardiologist:   Kathlyn Sacramento, MD   Chief Complaint  Patient presents with  . Other    Ref by Humphrey Rolls cardiac clearance colonoscopy. Meds reviewed verbally with pt.      History of Present Illness: [name] [name] is a 55 y.o. male who Was referred by Dr. Clayborn Bigness to reestablish cardiovascular care and for preoperative cardiovascular evaluation before colonoscopy. He has not been seen in our office since January 2014. He was seen by Dr. Aundra Dubin at that time. He has extensive cardiac history that include coronary artery disease s/p CABG and mitral valve repair in 2010, ischemic cardiomyopathy with previous ejection fraction of 20% most recently 40-45%, apical thrombus on anticoagulation with warfarin and ICD placement for primary prevention. He has not followed up with [place] mostly due to financial difficulties. His wife committed suicide about one year ago. He denies any chest pain or shortness of breath. No orthopnea or PND. No leg edema. He has been taking his medications regularly. He needs to have colonoscopy done. Unfortunately, he resumed smoking. He had an echocardiogram in April of this year which showed an ejection fraction of 40-45% with hypokinesis of the anterior and apical myocardium. There was intact mitral valve ring and moderately dilated left atrium with no evidence of pulmonary hypertension. No apical thrombus was noted.    Past Medical History  Diagnosis Date  . Hyperlipidemia   . Coronary artery disease     s/p CABG; EF 2011 40-45%  . Atrial fibrillation ([place])     Only post CABG  . Post-infarction apical thrombus (HCC)     on coumadin  . Cardiac defibrillator in situ     MADIT RIT trial    Past Surgical History  Procedure Laterality Date  . Cardiac defibrillator placement      [employer]  . Knee surgery    . Coronary artery  bypass graft  2010    LIMA to LAD, RIMA to RCA and mitral valve repair  . Mitral valve repair  2010     Current Outpatient Prescriptions  Medication Sig Dispense Refill  . atorvastatin (LIPITOR) 80 MG tablet TAKE 1 TABLET (80 MG TOTAL) BY MOUTH DAILY. 90 tablet 3  . carvedilol (COREG) 25 MG tablet Take 1 tablet (25 mg total) by mouth 2 (two) times daily with a meal. 180 tablet 1  . enalapril (VASOTEC) 20 MG tablet Take 1 tablet in am, 1/2 tablet in pm for a total of 30 mg daily formulary change for 2015 60 tablet 0  . nitroGLYCERIN (NITROSTAT) 0.4 MG SL tablet Place 0.4 mg under the tongue every 5 (five) minutes as needed.      . Omega-3 Fatty Acids (FISH OIL) 1200 MG CAPS Take 2,400 mg by mouth daily.     Marland Kitchen spironolactone (ALDACTONE) 25 MG tablet TAKE 1 TABLET (25 MG TOTAL) BY MOUTH DAILY. 90 tablet 1  . aspirin EC 81 MG tablet Take 1 tablet (81 mg total) by mouth daily. 90 tablet 3   No current facility-administered medications for this visit.    Allergies:   Review of patient's allergies indicates no known allergies.    Social History:  The patient  reports that he has been smoking Cigarettes.  He has a 37 pack-year smoking history. He has never used smokeless tobacco. He reports that he  does not drink alcohol or use illicit drugs.   Family History:  The patient's family history includes Heart attack in his mother; Heart attack (age of onset: 48) in his father; Heart failure in his mother; Hyperlipidemia in his other.    ROS:  Please see the history of present illness.   Otherwise, review of systems are positive for none.   All other systems are reviewed and negative.    PHYSICAL EXAM: VS:  BP 124/76 mmHg  Pulse 96  Ht 5\' 11"  (1.803 m)  Wt 187 lb 8 oz (85.049 kg)  BMI 26.16 kg/m2 , BMI Body mass index is 26.16 kg/(m^2). GEN: Well nourished, well developed, in no acute distress HEENT: normal Neck: no JVD, carotid bruits, or masses Cardiac: RRR; no murmurs, rubs, or gallops,no  edema  Respiratory:  clear to auscultation bilaterally, normal work of breathing GI: soft, nontender, nondistended, + BS MS: no deformity or atrophy Skin: warm and dry, no rash Neuro:  Strength and sensation are intact Psych: euthymic mood, full affect   EKG:  EKG is ordered today. The ekg ordered today demonstrates normal sinus rhythm with possible old anterior infarct with T wave changes suggestive of anterior and inferior ischemia.   Recent Labs: No results found for requested labs within last 365 days.    Lipid Panel    Component Value Date/Time   CHOL 113 10/23/2012 0907   CHOL 146 09/26/2011 1439   TRIG 118.0 10/23/2012 0907   HDL 35.70* 10/23/2012 0907   HDL 43 09/26/2011 1439   CHOLHDL 3 10/23/2012 0907   CHOLHDL 3.4 09/26/2011 1439   VLDL 23.6 10/23/2012 0907   LDLCALC 54 10/23/2012 0907   LDLCALC 73 09/26/2011 1439      Wt Readings from Last 3 Encounters:  05/01/16 187 lb 8 oz (85.049 kg)  10/23/12 197 lb (89.359 kg)  06/05/12 197 lb 8 oz (89.585 kg)        ASSESSMENT AND PLAN:  1.  Coronary artery disease involving native coronary arteries without angina: Overall he is doing well with no anginal symptoms. I recommend continuing medical therapy. Given that time stopping warfarin, I added aspirin 81 mg once daily.  2. Chronic systolic heart failure: Most recent ejection fraction was 40-45%. He is in [place] class II and currently is on optimal medical therapy.  3. History of apical thrombus: This has not been seen on the most recent echocardiograms and currently there is no indication for continued anticoagulation. The patient had atrial fibrillation only post CABG. Thus, I elected to discontinue warfarin and added aspirin 81 mg once daily.  4. Preoperative cardiovascular evaluation for colonoscopy: The patient's functional capacity is very good and his ejection fraction is significantly improved from before. Thus, he is at low risk for  cardiovascular complications. Aspirin can be held one week before the procedure.  5. Status post ICD placement: I referred him to Dr. Caryl Comes to reestablish device check.  6. Hyperlipidemia: Continue treatment with high dose atorvastatin. He reports that his LDL was below 70 which is optimal.  7. Essential hypertension: Blood pressure is reasonably controlled on current medications.  8. Tobacco use: We discussed briefly the importance of smoking cessation.    Disposition:   FU with me in 6 months  Signed,  Kathlyn Sacramento, MD  05/01/2016 4:50 PM    [place]

## 2016-05-01 NOTE — Telephone Encounter (Signed)
Cardiac clearance faxed to [facility] for colonoscopy. Fax, [phone]

## 2016-05-08 ENCOUNTER — Encounter: Payer: Self-pay | Admitting: Internal Medicine

## 2016-05-08 ENCOUNTER — Ambulatory Visit (INDEPENDENT_AMBULATORY_CARE_PROVIDER_SITE_OTHER): Payer: Commercial Managed Care - HMO | Admitting: Internal Medicine

## 2016-05-08 VITALS — BP 120/80 | HR 86 | Ht 71.0 in | Wt 193.8 lb

## 2016-05-08 DIAGNOSIS — I10 Essential (primary) hypertension: Secondary | ICD-10-CM | POA: Diagnosis not present

## 2016-05-08 DIAGNOSIS — I255 Ischemic cardiomyopathy: Secondary | ICD-10-CM

## 2016-05-08 DIAGNOSIS — Z95810 Presence of automatic (implantable) cardiac defibrillator: Secondary | ICD-10-CM | POA: Diagnosis not present

## 2016-05-08 DIAGNOSIS — E782 Mixed hyperlipidemia: Secondary | ICD-10-CM | POA: Diagnosis not present

## 2016-05-08 DIAGNOSIS — Z0001 Encounter for general adult medical examination with abnormal findings: Secondary | ICD-10-CM | POA: Diagnosis not present

## 2016-05-08 DIAGNOSIS — I4891 Unspecified atrial fibrillation: Secondary | ICD-10-CM | POA: Diagnosis not present

## 2016-05-08 DIAGNOSIS — Z125 Encounter for screening for malignant neoplasm of prostate: Secondary | ICD-10-CM | POA: Diagnosis not present

## 2016-05-08 LAB — CUP PACEART INCLINIC DEVICE CHECK
HighPow Impedance: 41 Ohm
HighPow Impedance: 54 Ohm
Implantable Lead Implant Date: 20101104
Implantable Lead Location: 753860
Implantable Lead Model: 158
Implantable Lead Serial Number: 301087
Lead Channel Pacing Threshold Amplitude: 0.7 V
Lead Channel Pacing Threshold Amplitude: 0.8 V
Lead Channel Pacing Threshold Pulse Width: 0.4 ms
Lead Channel Sensing Intrinsic Amplitude: 25 mV
Lead Channel Setting Pacing Amplitude: 2 V
Lead Channel Setting Pacing Amplitude: 2.5 V
Lead Channel Setting Pacing Pulse Width: 0.4 ms
Lead Channel Setting Sensing Sensitivity: 0.4 mV
MDC IDC LEAD IMPLANT DT: 20101104
MDC IDC LEAD LOCATION: 753859
MDC IDC MSMT LEADCHNL RA IMPEDANCE VALUE: 506 Ohm
MDC IDC MSMT LEADCHNL RA PACING THRESHOLD PULSEWIDTH: 0.4 ms
MDC IDC MSMT LEADCHNL RA SENSING INTR AMPL: 4.1 mV
MDC IDC MSMT LEADCHNL RV IMPEDANCE VALUE: 519 Ohm
MDC IDC PG SERIAL: 151503
MDC IDC SESS DTM: 20170718040000

## 2016-05-08 NOTE — Progress Notes (Signed)
ELECTROPHYSIOLOGY CONSULT NOTE  Patient ID: [name] [name], MRN: [id number], DOB/AGE: [birth year] 55 y.o. Admit date: (Not on file) Date of Consult: 05/08/2016  Primary Physician: Lavera Guise, MD Primary Cardiologist: MA Consulting Physician MA   Chief Complaint: REEESTABLISH   HPI [name] [name] is a 55 y.o. male  To reestablish for ICD follow-up initially implanted as part of the MADIT RIT  He was last seen in 2012.  He has a history of ischemic heart disease with bypass surgery and mitral valve repair 2010. He underwent ICD implant 2010.  No ICD shocks  The patient denies chest pain, shortness of breath, nocturnal dyspnea, orthopnea or peripheral edema.  There have been no palpitations, lightheadedness or syncope.    4/17 echocardiogram 40-45% with anterior apical hypokinesis. He previously identified apical thrombus was not evident. Warfarin was discontinued and aspirin initiated  Toelrating meds without difficulty  No labs have been checked in our system since 2014  He apparently has a history of hyperkalemia     Past Medical History  Diagnosis Date  . Hyperlipidemia   . Coronary artery disease     s/p CABG; EF 2011 40-45%  . Atrial fibrillation ([place])     Only post CABG  . Post-infarction apical thrombus (HCC)     on coumadin  . Cardiac defibrillator in situ     MADIT RIT trial      Surgical History:  Past Surgical History  Procedure Laterality Date  . Cardiac defibrillator placement      [employer]  . Knee surgery    . Coronary artery bypass graft  2010    LIMA to LAD, RIMA to RCA and mitral valve repair  . Mitral valve repair  2010     Home Meds: Prior to Admission medications   Medication Sig Start Date End Date Taking? Authorizing Provider  aspirin EC 81 MG tablet Take 1 tablet (81 mg total) by mouth daily. 05/01/16  Yes Wellington Hampshire, MD  atorvastatin (LIPITOR) 80 MG tablet TAKE 1 TABLET (80 MG TOTAL) BY MOUTH DAILY. 07/01/13   Yes Deboraha Sprang, MD  carvedilol (COREG) 25 MG tablet Take 1 tablet (25 mg total) by mouth 2 (two) times daily with a meal. 02/09/13  Yes Larey Dresser, MD  enalapril (VASOTEC) 20 MG tablet Take 1 tablet in am, 1/2 tablet in pm for a total of 30 mg daily formulary change for 2015 10/19/13  Yes Larey Dresser, MD  nitroGLYCERIN (NITROSTAT) 0.4 MG SL tablet Place 0.4 mg under the tongue every 5 (five) minutes as needed.     Yes Historical Provider, MD  Omega-3 Fatty Acids (FISH OIL) 1200 MG CAPS Take 2,400 mg by mouth daily.    Yes Historical Provider, MD  spironolactone (ALDACTONE) 25 MG tablet TAKE 1 TABLET (25 MG TOTAL) BY MOUTH DAILY. 10/12/12  Yes Larey Dresser, MD    Allergies: No Known Allergies  Social History   Social History  . Marital Status: Married    Spouse Name: N/A  . Number of Children: N/A  . Years of Education: N/A   Occupational History  . Not on file.   Social History Main Topics  . Smoking status: Current Every Day Smoker -- 1.00 packs/day for 37 years    Types: Cigarettes    Last Attempt to Quit: 05/02/2009  . Smokeless tobacco: Never Used  . Alcohol Use: No     Comment: ocassionally  . Drug Use:  No  . Sexual Activity: Not on file   Other Topics Concern  . Not on file   Social History Narrative     Family History  Problem Relation Age of Onset  . Heart attack Father 5  . Heart failure Mother   . Heart attack Mother   . Hyperlipidemia Other      ROS:  Please see the history of present illness.     All other systems reviewed and negative.    Physical Exam: Blood pressure 120/80, pulse 86, height 5\' 11"  (1.803 m), weight 193 lb 12.8 oz (87.907 kg), SpO2 95 %. General: Well developed, well nourished male in no acute distress. Head: Normocephalic, atraumatic, sclera non-icteric, no xanthomas, nares are without discharge. EENT: normal  Lymph Nodes:  none Neck: Negative for carotid bruits. JVD not elevated. Back:without scoliosis  kyphosis Lungs: Clear bilaterally to auscultation without wheezes, rales, or rhonchi. Breathing is unlabored. Device pocket well healed; without hematoma or erythema.  There is no tethering Heart: RRR with S1 S2. No  murmur . No rubs, or gallops appreciated. Abdomen: Soft, non-tender, non-distended with normoactive bowel sounds. No hepatomegaly. No rebound/guarding. No obvious abdominal masses. Msk:  Strength and tone appear normal for age. Extremities: No clubbing or cyanosis. No* edema.  Distal pedal pulses are 2+ and equal bilaterally. Skin: Warm and Dry Neuro: Alert and oriented X 3. CN III-XII intact Grossly normal sensory and motor function . Psych:  Responds to questions appropriately with a normal affect.      Labs: Cardiac Enzymes No results for input(s): CKTOTAL, CKMB, TROPONINI in the last 72 hours. CBC Lab Results  Component Value Date   WBC 10.3 09/26/2011   HGB 15.1 09/26/2011   HCT 43.2 09/26/2011   MCV 87 09/26/2011   PLT 277 09/26/2011   PROTIME: No results for input(s): LABPROT, INR in the last 72 hours. Chemistry No results for input(s): NA, K, CL, CO2, BUN, CREATININE, CALCIUM, PROT, BILITOT, ALKPHOS, ALT, AST, GLUCOSE in the last 168 hours.  Invalid input(s): LABALBU Lipids Lab Results  Component Value Date   CHOL 113 10/23/2012   HDL 35.70* 10/23/2012   LDLCALC 54 10/23/2012   TRIG 118.0 10/23/2012   BNP No results found for: PROBNP Thyroid Function Tests: No results for input(s): TSH, T4TOTAL, T3FREE, THYROIDAB in the last 72 hours.  Invalid input(s): FREET3 Miscellaneous No results found for: DDIMER  Radiology/Studies:  No results found.  EKG: 05/04/16 Sinus at 96 Intervals 13/11/36 Inferolateral ST-T changes   Assessment and Plan:   Ischemic cardiomyopathy    implantable defibrillator-[employer]   History of hyperkalemia  Tobacco use    The patient's defibrillator is functioning normally. There were some episodes of  monitor sinus tachycardia; we have reprogrammed the device to eliminate his monitor zone.  Blood work is scheduled through Dr. Laurelyn Sickle office; I have asked the patient to forward as was results particularly his potassium and creatinine  We will begin him on remote follow-up.  We discussed smoking cessation and I gave him the contact number for [phone]    Virl Axe

## 2016-05-08 NOTE — Patient Instructions (Signed)
Medication Instructions: - Your physician recommends that you continue on your current medications as directed. Please refer to the Current Medication list given to you today.  Labwork: - none  Procedures/Testing: - none  Follow-Up: - Your physician recommends that you schedule a follow-up appointment in: 3 months with Raquel Sarna in the [place] physician wants you to follow-up in: 9 months with Dr. Caryl Comes. You will receive a reminder letter in the mail two months in advance. If you don't receive a letter, please call our office to schedule the follow-up appointment.   Any Additional Special Instructions Will Be Listed Below (If Applicable).     If you need a refill on your cardiac medications before your next appointment, please call your pharmacy.

## 2016-05-11 ENCOUNTER — Encounter: Payer: Self-pay | Admitting: *Deleted

## 2016-06-04 ENCOUNTER — Encounter: Payer: Self-pay | Admitting: Internal Medicine

## 2016-07-30 ENCOUNTER — Encounter: Payer: Self-pay | Admitting: *Deleted

## 2016-07-31 ENCOUNTER — Ambulatory Visit: Payer: Commercial Managed Care - HMO | Admitting: Anesthesiology

## 2016-07-31 ENCOUNTER — Encounter: Payer: Self-pay | Admitting: *Deleted

## 2016-07-31 ENCOUNTER — Encounter
Admission: RE | Disposition: A | Discharge status: Home / Self Care | Payer: Self-pay | Source: Ambulatory Visit | Attending: Gastroenterology

## 2016-07-31 ENCOUNTER — Ambulatory Visit
Admission: RE | Admit: 2016-07-31 | Discharge: 2016-07-31 | Disposition: A | Discharge status: Home / Self Care | Payer: Commercial Managed Care - HMO | Source: Ambulatory Visit | Attending: Gastroenterology | Admitting: Gastroenterology

## 2016-07-31 DIAGNOSIS — Z1211 Encounter for screening for malignant neoplasm of colon: Secondary | ICD-10-CM | POA: Insufficient documentation

## 2016-07-31 DIAGNOSIS — Z79899 Other long term (current) drug therapy: Secondary | ICD-10-CM | POA: Diagnosis not present

## 2016-07-31 DIAGNOSIS — I4891 Unspecified atrial fibrillation: Secondary | ICD-10-CM | POA: Diagnosis not present

## 2016-07-31 DIAGNOSIS — Z95810 Presence of automatic (implantable) cardiac defibrillator: Secondary | ICD-10-CM | POA: Diagnosis not present

## 2016-07-31 DIAGNOSIS — K5730 Diverticulosis of large intestine without perforation or abscess without bleeding: Secondary | ICD-10-CM | POA: Diagnosis not present

## 2016-07-31 DIAGNOSIS — Z7982 Long term (current) use of aspirin: Secondary | ICD-10-CM | POA: Insufficient documentation

## 2016-07-31 DIAGNOSIS — K635 Polyp of colon: Secondary | ICD-10-CM | POA: Diagnosis not present

## 2016-07-31 DIAGNOSIS — K648 Other hemorrhoids: Secondary | ICD-10-CM | POA: Diagnosis not present

## 2016-07-31 DIAGNOSIS — I2510 Atherosclerotic heart disease of native coronary artery without angina pectoris: Secondary | ICD-10-CM | POA: Insufficient documentation

## 2016-07-31 DIAGNOSIS — K640 First degree hemorrhoids: Secondary | ICD-10-CM | POA: Insufficient documentation

## 2016-07-31 DIAGNOSIS — E785 Hyperlipidemia, unspecified: Secondary | ICD-10-CM | POA: Diagnosis not present

## 2016-07-31 DIAGNOSIS — K5790 Diverticulosis of intestine, part unspecified, without perforation or abscess without bleeding: Secondary | ICD-10-CM | POA: Diagnosis not present

## 2016-07-31 DIAGNOSIS — D125 Benign neoplasm of sigmoid colon: Secondary | ICD-10-CM | POA: Diagnosis not present

## 2016-07-31 DIAGNOSIS — Z951 Presence of aortocoronary bypass graft: Secondary | ICD-10-CM | POA: Diagnosis not present

## 2016-07-31 HISTORY — PX: COLONOSCOPY WITH PROPOFOL: SHX5780

## 2016-07-31 SURGERY — COLONOSCOPY WITH PROPOFOL
Anesthesia: General

## 2016-07-31 MED ORDER — LACTATED RINGERS IV SOLN
INTRAVENOUS | Status: DC | PRN
  Administered 2016-07-31: 15:00:00 via INTRAVENOUS

## 2016-07-31 MED ORDER — SODIUM CHLORIDE 0.9 % IV SOLN
2.0000 g | Freq: Once | INTRAVENOUS | Status: AC
  Administered 2016-07-31: 2 g via INTRAVENOUS
  Filled 2016-07-31: qty 2000

## 2016-07-31 MED ORDER — PROPOFOL 500 MG/50ML IV EMUL
INTRAVENOUS | Status: DC | PRN
  Administered 2016-07-31: 140 ug/kg/min via INTRAVENOUS

## 2016-07-31 MED ORDER — SODIUM CHLORIDE 0.9 % IV SOLN: INTRAVENOUS | Status: DC

## 2016-07-31 MED ORDER — SODIUM CHLORIDE 0.9 % IV SOLN
INTRAVENOUS | Status: DC
  Administered 2016-07-31: 1000 mL via INTRAVENOUS

## 2016-07-31 MED ORDER — PROPOFOL 10 MG/ML IV BOLUS
INTRAVENOUS | Status: DC | PRN
  Administered 2016-07-31: 40 mg via INTRAVENOUS

## 2016-07-31 NOTE — Op Note (Signed)
[facility] Gastroenterology Patient Name: Gurnoor Pilgram Procedure Date: 07/31/2016 2:28 PM MRN: [id number] Account #: [id number] Date of Birth: [birth year] Admit Type: Outpatient Age: 55 Room: [facility] ENDO ROOM 3 Gender: Male Note Status: Finalized Procedure:            Colonoscopy Indications:          Screening for colorectal malignant neoplasm, This is                        the patient's first colonoscopy Providers:            Lollie Sails, MD Referring MD:         Lavera Guise, MD (Referring MD) Medicines:            Monitored Anesthesia Care Complications:        No immediate complications. Procedure:            Pre-Anesthesia Assessment:                       - ASA Grade Assessment: III - A patient with severe                        systemic disease.                       After obtaining informed consent, the colonoscope was                        passed under direct vision. Throughout the procedure,                        the patient's blood pressure, pulse, and oxygen                        saturations were monitored continuously. The                        Colonoscope was introduced through the anus and                        advanced to the the cecum, identified by appendiceal                        orifice and ileocecal valve. The quality of the bowel                        preparation was fair. Findings:      A 6 mm polyp was found in the mid sigmoid colon. The polyp was sessile.       The polyp was removed with a cold snare. Resection and retrieval were       complete.      Non-bleeding internal hemorrhoids were found during retroflexion and       during anoscopy. The hemorrhoids were small and Grade I (internal       hemorrhoids that do not prolapse).      The digital rectal exam was normal.      A single medium-mouthed diverticulum was found in the ascending colon. Impression:           - Preparation of the colon was fair.      - One 6 mm polyp  in the mid sigmoid colon, removed with                        a cold snare. Resected and retrieved.                       - Non-bleeding internal hemorrhoids. Recommendation:       - Discharge patient to home.                       - Telephone GI clinic for pathology results in 1 week. Procedure Code(s):    --- Professional ---                       [phone], Colonoscopy, flexible; with removal of tumor(s),                        polyp(s), or other lesion(s) by snare technique Diagnosis Code(s):    --- Professional ---                       Z12.11, Encounter for screening for malignant neoplasm                        of colon                       K64.0, First degree hemorrhoids                       D12.5, Benign neoplasm of sigmoid colon CPT copyright 2016 American Medical Association. All rights reserved. The codes documented in this report are preliminary and upon coder review may  be revised to meet current compliance requirements. Lollie Sails, MD 07/31/2016 3:14:04 PM This report has been signed electronically. Number of Addenda: 0 Note Initiated On: 07/31/2016 2:28 PM Scope Withdrawal Time: 0 hours 12 minutes 21 seconds  Total Procedure Duration: 0 hours 28 minutes 51 seconds       [facility]

## 2016-07-31 NOTE — Transfer of Care (Signed)
Immediate Anesthesia Transfer of Care Note  Patient: [name] [name]  Procedure(s) Performed: Procedure(s): COLONOSCOPY WITH PROPOFOL (N/A)  Patient Location: PACU and Endoscopy Unit  Anesthesia Type:General  Level of Consciousness: awake, alert  and oriented  Airway & Oxygen Therapy: Patient Spontanous Breathing and Patient connected to nasal cannula oxygen  Post-op Assessment: Report given to RN and Post -op Vital signs reviewed and stable  Post vital signs: Reviewed and stable  Last Vitals:  Vitals:   07/31/16 1314 07/31/16 1518  BP: (!) 144/81 108/72  Pulse: 89 72  Resp: 18 (!) 22  Temp: 36.4 C     Last Pain:  Vitals:   07/31/16 1314  TempSrc: Tympanic         Complications: No apparent anesthesia complications

## 2016-07-31 NOTE — H&P (Signed)
Outpatient short stay form Pre-procedure 07/31/2016 2:28 PM Lollie Sails MD  Primary Physician: Dr Clayborn Bigness  Reason for visit:  Colonoscopy  History of present illness:  Patient is a 55 year old male presenting today as above. He tolerated his prep well. He has a history of taking Coumadin however that agent was discontinued on 05/01/2016. Is also held aspirin for 7 days. He takes no other aspirin products or blood thinning agents.   Current Facility-Administered Medications:  .  0.9 %  sodium chloride infusion, , Intravenous, Continuous, Lollie Sails, MD, Last Rate: 20 mL/hr at 07/31/16 1331, 1,000 mL at 07/31/16 1331 .  0.9 %  sodium chloride infusion, , Intravenous, Continuous, Lollie Sails, MD  Prescriptions Prior to Admission  Medication Sig Dispense Refill Last Dose  . aspirin EC 81 MG tablet Take 1 tablet (81 mg total) by mouth daily. 90 tablet 3 Past Week at Unknown time  . atorvastatin (LIPITOR) 80 MG tablet TAKE 1 TABLET (80 MG TOTAL) BY MOUTH DAILY. 90 tablet 3 07/30/2016 at Unknown time  . carvedilol (COREG) 25 MG tablet Take 1 tablet (25 mg total) by mouth 2 (two) times daily with a meal. 180 tablet 1 07/30/2016 at Unknown time  . enalapril (VASOTEC) 20 MG tablet Take 1 tablet in am, 1/2 tablet in pm for a total of 30 mg daily formulary change for 2015 60 tablet 0 07/30/2016 at Unknown time  . nitroGLYCERIN (NITROSTAT) 0.4 MG SL tablet Place 0.4 mg under the tongue every 5 (five) minutes as needed.     Past Week at Unknown time  . Omega-3 Fatty Acids (FISH OIL) 1200 MG CAPS Take 2,400 mg by mouth daily.    Past Week at Unknown time  . spironolactone (ALDACTONE) 25 MG tablet TAKE 1 TABLET (25 MG TOTAL) BY MOUTH DAILY. 90 tablet 1 07/30/2016 at Unknown time     No Known Allergies   Past Medical History:  Diagnosis Date  . Atrial fibrillation ([place])    Only post CABG  . Cardiac defibrillator in situ    MADIT RIT trial  . Coronary artery disease    s/p CABG;  EF 2011 40-45%  . Hyperlipidemia   . Post-infarction apical thrombus (HCC)    on coumadin    Review of systems:      Physical Exam    Heart and lungs: Regular rate and rhythm without rub or gallop, lungs are bilaterally clear.    HEENT: Normocephalic atraumatic eyes are anicteric    Other:     Pertinant exam for procedure: Soft nontender nondistended bowel sounds positive normoactive.    Planned proceedures: Colonoscopy and indicated procedures. I have discussed the risks benefits and complications of procedures to include not limited to bleeding, infection, perforation and the risk of sedation and the patient wishes to proceed.    Lollie Sails, MD Gastroenterology 07/31/2016  2:28 PM

## 2016-07-31 NOTE — Anesthesia Preprocedure Evaluation (Signed)
Anesthesia Evaluation  Patient identified by MRN, date of birth, ID band Patient awake    Reviewed: Allergy & Precautions, NPO status , Patient's Chart, lab work & pertinent test results, reviewed documented beta blocker date and time , Unable to perform ROS - Chart review only  Airway Mallampati: II  TM Distance: >3 FB     Dental   Pulmonary Current Smoker,    Pulmonary exam normal        Cardiovascular hypertension, Pt. on medications and Pt. on home beta blockers + CAD  Normal cardiovascular exam+ dysrhythmias Atrial Fibrillation + Cardiac Defibrillator + Valvular Problems/Murmurs      Neuro/Psych negative neurological ROS  negative psych ROS   GI/Hepatic negative GI ROS, Neg liver ROS,   Endo/Other  negative endocrine ROS  Renal/GU negative Renal ROS  negative genitourinary   Musculoskeletal negative musculoskeletal ROS (+)   Abdominal Normal abdominal exam  (+)   Peds negative pediatric ROS (+)  Hematology negative hematology ROS (+)   Anesthesia Other Findings Past Medical History: No date: Atrial fibrillation (HCC)     Comment: Only post CABG No date: Cardiac defibrillator in situ     Comment: MADIT RIT trial No date: Coronary artery disease     Comment: s/p CABG; EF 2011 40-45% No date: Hyperlipidemia No date: Post-infarction apical thrombus (HCC)     Comment: on coumadin  Reproductive/Obstetrics                             Anesthesia Physical Anesthesia Plan  ASA: III  Anesthesia Plan: General   Post-op Pain Management:    Induction: Intravenous  Airway Management Planned: Nasal Cannula  Additional Equipment:   Intra-op Plan:   Post-operative Plan:   Informed Consent: I have reviewed the patients History and Physical, chart, labs and discussed the procedure including the risks, benefits and alternatives for the proposed anesthesia with the patient or authorized  representative who has indicated his/her understanding and acceptance.   Dental advisory given  Plan Discussed with: CRNA and Surgeon  Anesthesia Plan Comments:         Anesthesia Quick Evaluation

## 2016-07-31 NOTE — Anesthesia Postprocedure Evaluation (Signed)
Anesthesia Post Note  Patient: [name] [name]  Procedure(s) Performed: Procedure(s) (LRB): COLONOSCOPY WITH PROPOFOL (N/A)  Patient location during evaluation: PACU Anesthesia Type: General Level of consciousness: awake and alert and oriented Pain management: pain level controlled Vital Signs Assessment: post-procedure vital signs reviewed and stable Respiratory status: spontaneous breathing Cardiovascular status: blood pressure returned to baseline Anesthetic complications: no    Last Vitals:  Vitals:   07/31/16 1314 07/31/16 1518  BP: (!) 144/81 108/72  Pulse: 89 72  Resp: 18 (!) 22  Temp: 36.4 C     Last Pain:  Vitals:   07/31/16 1314  TempSrc: Tympanic                 Wayland Baik

## 2016-08-01 ENCOUNTER — Encounter: Payer: Self-pay | Admitting: Gastroenterology

## 2016-08-02 LAB — SURGICAL PATHOLOGY

## 2016-10-08 DIAGNOSIS — I2510 Atherosclerotic heart disease of native coronary artery without angina pectoris: Secondary | ICD-10-CM | POA: Diagnosis not present

## 2016-10-08 DIAGNOSIS — I6523 Occlusion and stenosis of bilateral carotid arteries: Secondary | ICD-10-CM | POA: Diagnosis not present

## 2016-10-08 DIAGNOSIS — E782 Mixed hyperlipidemia: Secondary | ICD-10-CM | POA: Diagnosis not present

## 2016-10-08 DIAGNOSIS — Z0001 Encounter for general adult medical examination with abnormal findings: Secondary | ICD-10-CM | POA: Diagnosis not present

## 2016-10-08 DIAGNOSIS — I10 Essential (primary) hypertension: Secondary | ICD-10-CM | POA: Diagnosis not present

## 2016-12-18 ENCOUNTER — Ambulatory Visit (INDEPENDENT_AMBULATORY_CARE_PROVIDER_SITE_OTHER): Payer: Medicare HMO | Admitting: *Deleted

## 2016-12-18 DIAGNOSIS — Z95810 Presence of automatic (implantable) cardiac defibrillator: Secondary | ICD-10-CM

## 2016-12-18 DIAGNOSIS — I255 Ischemic cardiomyopathy: Secondary | ICD-10-CM

## 2016-12-18 LAB — CUP PACEART INCLINIC DEVICE CHECK
HIGH POWER IMPEDANCE MEASURED VALUE: 41 Ohm
HIGH POWER IMPEDANCE MEASURED VALUE: 54 Ohm
Implantable Lead Implant Date: 20101104
Implantable Lead Location: 753859
Implantable Lead Model: 5076
Implantable Lead Serial Number: 301087
Implantable Pulse Generator Implant Date: 20101104
Lead Channel Impedance Value: 516 Ohm
Lead Channel Pacing Threshold Amplitude: 0.7 V
Lead Channel Pacing Threshold Pulse Width: 0.4 ms
Lead Channel Pacing Threshold Pulse Width: 0.4 ms
Lead Channel Sensing Intrinsic Amplitude: 25 mV
Lead Channel Setting Pacing Amplitude: 2 V
Lead Channel Setting Sensing Sensitivity: 0.4 mV
MDC IDC LEAD IMPLANT DT: 20101104
MDC IDC LEAD LOCATION: 753860
MDC IDC MSMT LEADCHNL RA IMPEDANCE VALUE: 461 Ohm
MDC IDC MSMT LEADCHNL RA PACING THRESHOLD AMPLITUDE: 0.9 V
MDC IDC MSMT LEADCHNL RA SENSING INTR AMPL: 4 mV
MDC IDC SESS DTM: 20180227050000
MDC IDC SET LEADCHNL RV PACING AMPLITUDE: 2.5 V
MDC IDC SET LEADCHNL RV PACING PULSEWIDTH: 0.4 ms
MDC IDC STAT BRADY RA PERCENT PACED: 1 % — AB
MDC IDC STAT BRADY RV PERCENT PACED: 1 % — AB
Pulse Gen Serial Number: 151503

## 2016-12-18 NOTE — Progress Notes (Signed)
ICD check in clinic. Normal device function. Thresholds and sensing consistent with previous device measurements. Impedance trends stable over time. 3 NSVT episodes--longest 14bts. 17 mode switches (<1%)--brief AT per EGMs, total time 14.53min, +ASA 81mg . Histogram distribution appropriate for patient and level of activity. No changes made this session. Device programmed at appropriate safety margins. Device programmed to optimize intrinsic conduction. Estimated longevity 4.5 years. Pt enrolled in remote follow-up. Patient education completed including shock plan. ROV with SK/B on 01/29/17.

## 2017-01-14 ENCOUNTER — Encounter: Payer: Self-pay | Admitting: Cardiovascular Disease

## 2017-01-14 ENCOUNTER — Ambulatory Visit (INDEPENDENT_AMBULATORY_CARE_PROVIDER_SITE_OTHER): Payer: Medicare HMO | Admitting: Cardiovascular Disease

## 2017-01-14 VITALS — BP 128/78 | HR 68 | Ht 70.0 in | Wt 195.5 lb

## 2017-01-14 DIAGNOSIS — I10 Essential (primary) hypertension: Secondary | ICD-10-CM | POA: Diagnosis not present

## 2017-01-14 DIAGNOSIS — Z720 Tobacco use: Secondary | ICD-10-CM

## 2017-01-14 DIAGNOSIS — E785 Hyperlipidemia, unspecified: Secondary | ICD-10-CM

## 2017-01-14 DIAGNOSIS — I5022 Chronic systolic (congestive) heart failure: Secondary | ICD-10-CM | POA: Diagnosis not present

## 2017-01-14 DIAGNOSIS — I2510 Atherosclerotic heart disease of native coronary artery without angina pectoris: Secondary | ICD-10-CM | POA: Diagnosis not present

## 2017-01-14 NOTE — Patient Instructions (Signed)
Medication Instructions:  Your physician recommends that you continue on your current medications as directed. Please refer to the Current Medication list given to you today.   Labwork: Fasting lipid and liver profile and BMET at the [facility] outpatient lab. Nothing to eat or drink after midnight the evening before your labs.   Testing/Procedures: none  Follow-Up: Your physician wants you to follow-up in: 9 months with Dr. Fletcher Anon.  You will receive a reminder letter in the mail two months in advance. If you don't receive a letter, please call our office to schedule the follow-up appointment.   Any Other Special Instructions Will Be Listed Below (If Applicable).     If you need a refill on your cardiac medications before your next appointment, please call your pharmacy.

## 2017-01-14 NOTE — Progress Notes (Signed)
Cardiology Office Note   Date:  01/14/2017   ID:  [name] [name], DOB February 07, 1961, MRN [id number]  PCP:  Lavera Guise, MD  Cardiologist:   Kathlyn Sacramento, MD   Chief Complaint  Patient presents with  . other    38mo f/u. Pt states he is doing well. Reviewed meds with pt verbally.      History of Present Illness: [name] [name] is a 56 y.o. male who is here today for a follow-up visit regarding coronary artery disease and chronic systolic heart failure. He has extensive cardiac history that include coronary artery disease s/p CABG and mitral valve repair in 2010, ischemic cardiomyopathy with previous ejection fraction of 20% most recently 40-45%, previous apical thrombus  and ICD placement for primary prevention. Most recent echocardiogram in April 2017 showed an EF of 40-45% with anterior and anteroseptal hypokinesis, mild aortic regurgitation, intact mitral valve repair and no significant pulmonary hypertension. There was no evidence of apical thrombus. Thus, warfarin was discontinued. He has been doing well and denies any chest pain or shortness of breath. Unfortunately, he continues to smoke.   Past Medical History:  Diagnosis Date  . Atrial fibrillation ([place])    Only post CABG  . Cardiac defibrillator in situ    MADIT RIT trial  . Coronary artery disease    s/p CABG; EF 2011 40-45%  . Hyperlipidemia   . Post-infarction apical thrombus ([place])    on coumadin    Past Surgical History:  Procedure Laterality Date  . CARDIAC DEFIBRILLATOR PLACEMENT     [employer]  . COLONOSCOPY WITH PROPOFOL N/A 07/31/2016   Procedure: COLONOSCOPY WITH PROPOFOL;  Surgeon: Lollie Sails, MD;  [facility] ENDOSCOPY;  Service: Endoscopy;  Laterality: N/A;  . CORONARY ARTERY BYPASS GRAFT  2010   LIMA to LAD, RIMA to RCA and mitral valve repair  . KNEE SURGERY    . MITRAL VALVE REPAIR  2010     Current Outpatient Prescriptions  Medication Sig Dispense Refill  . aspirin EC  81 MG tablet Take 1 tablet (81 mg total) by mouth daily. 90 tablet 3  . atorvastatin (LIPITOR) 80 MG tablet TAKE 1 TABLET (80 MG TOTAL) BY MOUTH DAILY. 90 tablet 3  . carvedilol (COREG) 25 MG tablet Take 1 tablet (25 mg total) by mouth 2 (two) times daily with a meal. 180 tablet 1  . enalapril (VASOTEC) 20 MG tablet Take 1 tablet in am, 1/2 tablet in pm for a total of 30 mg daily formulary change for 2015 60 tablet 0  . nitroGLYCERIN (NITROSTAT) 0.4 MG SL tablet Place 0.4 mg under the tongue every 5 (five) minutes as needed.      . Omega-3 Fatty Acids (FISH OIL) 1200 MG CAPS Take 2,400 mg by mouth daily.     Marland Kitchen spironolactone (ALDACTONE) 25 MG tablet TAKE 1 TABLET (25 MG TOTAL) BY MOUTH DAILY. 90 tablet 1   No current facility-administered medications for this visit.     Allergies:   Patient has no known allergies.    Social History:  The patient  reports that he has been smoking Cigarettes.  He has a 37.00 pack-year smoking history. He has never used smokeless tobacco. He reports that he does not drink alcohol or use drugs.   Family History:  The patient's family history includes Heart attack in his mother; Heart attack (age of onset: 70) in his father; Heart failure in his mother; Hyperlipidemia in his other.  ROS:  Please see the history of present illness.   Otherwise, review of systems are positive for none.   All other systems are reviewed and negative.    PHYSICAL EXAM: VS:  BP 128/78 (BP Location: Left Arm, Patient Position: Sitting, Cuff Size: Normal)   Pulse 68   Ht 5\' 10"  (1.778 m)   Wt 195 lb 8 oz (88.7 kg)   BMI 28.05 kg/m  , BMI Body mass index is 28.05 kg/m. GEN: Well nourished, well developed, in no acute distress  HEENT: normal  Neck: no JVD, carotid bruits, or masses Cardiac: RRR; no murmurs, rubs, or gallops,no edema  Respiratory:  clear to auscultation bilaterally, normal work of breathing GI: soft, nontender, nondistended, + BS MS: no deformity or atrophy    Skin: warm and dry, no rash Neuro:  Strength and sensation are intact Psych: euthymic mood, full affect   EKG:  EKG is ordered today. The ekg ordered today demonstrates normal sinus rhythm with possible old anterior infarct with T wave changes suggestive of anterior and inferior ischemia.   Recent Labs: No results found for requested labs within last 8760 hours.    Lipid Panel    Component Value Date/Time   CHOL 113 10/23/2012 0907   CHOL 146 09/26/2011 1439   TRIG 118.0 10/23/2012 0907   HDL 35.70 (L) 10/23/2012 0907   HDL 43 09/26/2011 1439   CHOLHDL 3 10/23/2012 0907   VLDL 23.6 10/23/2012 0907   LDLCALC 54 10/23/2012 0907   LDLCALC 73 09/26/2011 1439      Wt Readings from Last 3 Encounters:  01/14/17 195 lb 8 oz (88.7 kg)  07/31/16 185 lb 8 oz (84.1 kg)  05/08/16 193 lb 12.8 oz (87.9 kg)        ASSESSMENT AND PLAN:  1.  Coronary artery disease involving native coronary arteries without angina: Overall he is doing well with no anginal symptoms. I recommend continuing medical therapy.   2. Chronic systolic heart failure: Most recent ejection fraction was 40-45%. He is in Cel[place] class II and currently is on optimal medical therapy.I requested basic metabolic profile given that he is on spironolactone and enalapril.  3. Status post ICD placement: This is currently being followed by Dr. Caryl Comes.  6. Hyperlipidemia: Continue treatment with high dose atorvastatin.  I requested lipid and liver profile.  7. Essential hypertension: Blood pressure is well controlled on current medications.  8. Tobacco use: I again discussed with him the importance of smoking cessation.    Disposition:   FU with me in 9 months  Signed,  Kathlyn Sacramento, MD  01/14/2017 4:31 PM    [place]

## 2017-01-15 ENCOUNTER — Other Ambulatory Visit
Admission: RE | Admit: 2017-01-15 | Discharge: 2017-01-15 | Disposition: A | Discharge status: Home / Self Care | Payer: Medicare HMO | Source: Ambulatory Visit | Attending: Cardiovascular Disease | Admitting: Cardiovascular Disease

## 2017-01-15 DIAGNOSIS — E785 Hyperlipidemia, unspecified: Secondary | ICD-10-CM | POA: Diagnosis not present

## 2017-01-15 DIAGNOSIS — I10 Essential (primary) hypertension: Secondary | ICD-10-CM | POA: Insufficient documentation

## 2017-01-15 LAB — BASIC METABOLIC PANEL
ANION GAP: 6 (ref 5–15)
BUN: 15 mg/dL (ref 6–20)
CALCIUM: 9.1 mg/dL (ref 8.9–10.3)
CO2: 27 mmol/L (ref 22–32)
Chloride: 106 mmol/L (ref 101–111)
Creatinine, Ser: 0.77 mg/dL (ref 0.61–1.24)
GFR calc non Af Amer: >60 mL/min (ref >60)
Glucose, Bld: 108 mg/dL — ABNORMAL HIGH (ref 65–99)
Potassium: 4.2 mmol/L (ref 3.5–5.1)
Sodium: 139 mmol/L (ref 135–145)

## 2017-01-15 LAB — HEPATIC FUNCTION PANEL
ALT: 28 U/L (ref 17–63)
AST: 25 U/L (ref 15–41)
Albumin: 4 g/dL (ref 3.5–5.0)
Alkaline Phosphatase: 55 U/L (ref 38–126)
Total Bilirubin: 0.8 mg/dL (ref 0.3–1.2)
Total Protein: 7.1 g/dL (ref 6.5–8.1)

## 2017-01-15 LAB — LIPID PANEL
Cholesterol: 155 mg/dL (ref 0–200)
HDL: 45 mg/dL (ref >40)
LDL CALC: 81 mg/dL (ref 0–99)
TRIGLYCERIDES: 147 mg/dL (ref <150)
Total CHOL/HDL Ratio: 3.4 RATIO
VLDL: 29 mg/dL (ref 0–40)

## 2017-01-29 ENCOUNTER — Encounter: Payer: Self-pay | Admitting: Internal Medicine

## 2017-01-29 ENCOUNTER — Ambulatory Visit (INDEPENDENT_AMBULATORY_CARE_PROVIDER_SITE_OTHER): Payer: Medicare HMO | Admitting: Internal Medicine

## 2017-01-29 VITALS — BP 110/64 | HR 63 | Ht 70.0 in | Wt 193.5 lb

## 2017-01-29 DIAGNOSIS — Z95810 Presence of automatic (implantable) cardiac defibrillator: Secondary | ICD-10-CM | POA: Diagnosis not present

## 2017-01-29 DIAGNOSIS — I255 Ischemic cardiomyopathy: Secondary | ICD-10-CM | POA: Diagnosis not present

## 2017-01-29 NOTE — Progress Notes (Signed)
Patient Care Team: Lavera Guise, MD as PCP - General (Internal Medicine) Wellington Hampshire, MD as Consulting Physician (Cardiology)   HPI  [name] [name] is a 56 y.o. male Seen in followup for ICD implanted originally as part of MADIT-RIT for primary prevention with ischemic cardiomyopathy with previous ejection fraction of 20%   He has  coronary artery disease s/p CABG and mitral valve repair in 2010; 4/17  Echo 40-45%, previous apical thrombus  and ICD placement for primary prevention.  The patient denies chest pain, shortness of breath, nocturnal dyspnea, orthopnea or peripheral edema.  There have been no palpitations, lightheadedness or syncope.   Needs dental surgery to repair martial arts damage to his jaw  Tolerating meds  Date Cr K Mg  3/18  0.77 4.2    Personally reviewed      Records and Results Reviewed Outpt records  Past Medical History:  Diagnosis Date  . Atrial fibrillation ([place])    Only post CABG  . Cardiac defibrillator in situ    MADIT RIT trial  . Coronary artery disease    s/p CABG; EF 2011 40-45%  . Hyperlipidemia   . Post-infarction apical thrombus ([place])    on coumadin    Past Surgical History:  Procedure Laterality Date  . CARDIAC DEFIBRILLATOR PLACEMENT     [employer]  . COLONOSCOPY WITH PROPOFOL N/A 07/31/2016   Procedure: COLONOSCOPY WITH PROPOFOL;  Surgeon: Lollie Sails, MD;  Location: [facility] ENDOSCOPY;  Service: Endoscopy;  Laterality: N/A;  . CORONARY ARTERY BYPASS GRAFT  2010   LIMA to LAD, RIMA to RCA and mitral valve repair  . KNEE SURGERY    . MITRAL VALVE REPAIR  2010    Current Outpatient Prescriptions  Medication Sig Dispense Refill  . aspirin EC 81 MG tablet Take 1 tablet (81 mg total) by mouth daily. 90 tablet 3  . atorvastatin (LIPITOR) 80 MG tablet TAKE 1 TABLET (80 MG TOTAL) BY MOUTH DAILY. 90 tablet 3  . carvedilol (COREG) 25 MG tablet Take 1 tablet (25 mg total) by mouth 2 (two) times daily with a  meal. 180 tablet 1  . enalapril (VASOTEC) 20 MG tablet Take 1 tablet in am, 1/2 tablet in pm for a total of 30 mg daily formulary change for 2015 60 tablet 0  . nitroGLYCERIN (NITROSTAT) 0.4 MG SL tablet Place 0.4 mg under the tongue every 5 (five) minutes as needed.      . Omega-3 Fatty Acids (FISH OIL) 1200 MG CAPS Take 2,400 mg by mouth daily.     Marland Kitchen spironolactone (ALDACTONE) 25 MG tablet TAKE 1 TABLET (25 MG TOTAL) BY MOUTH DAILY. 90 tablet 1   No current facility-administered medications for this visit.     No Known Allergies    Review of Systems negative except from HPI and PMH  Physical Exam BP 110/64 (BP Location: Left Arm, Patient Position: Sitting, Cuff Size: Normal)   Pulse 63   Ht 5\' 10"  (1.778 m)   Wt 193 lb 8 oz (87.8 kg)   BMI 27.76 kg/m  Well developed and well nourished in no acute distress HENT normal E scleral and icterus clear Neck Supple JVP flat; carotids brisk and full Clear to ausculation Device pocket well healed; without hematoma or erythema.  There is no tethering  Regular rate and rhythm, no murmurs gallops or rub Soft with active bowel sounds No clubbing cyanosis  Edema Alert and oriented, grossly normal motor and  sensory function Skin Warm and Dry  ECG personally reviewed sinus 63 14/12/*43    Assessment and Plan:   Ischemic cardiomyopathy   Implantable defibrillator-[employer]  The patient's device was interrogated and the information was fully reviewed.  The device was reprogrammed to activate discriminators for SVT  History of hyperkalemia  Without symptoms of ischemia  Continue Guideline directed medical therapy   Labs reviewed from last month --great             Current medicines are reviewed at length with the patient today .  The patient does not  have concerns regarding medicines.

## 2017-01-29 NOTE — Patient Instructions (Signed)
Medication Instructions: - Your physician recommends that you continue on your current medications as directed. Please refer to the Current Medication list given to you today.  Labwork: - none ordered  Procedures/Testing: - none ordered  Follow-Up: - Remote monitoring is used to monitor your Pacemaker of ICD from home. This monitoring reduces the number of office visits required to check your device to one time per year. It allows [place] to keep an eye on the functioning of your device to ensure it is working properly. You are scheduled for a device check from home on 04/30/17. You may send your transmission at any time that day. If you have a wireless device, the transmission will be sent automatically. After your physician reviews your transmission, you will receive a postcard with your next transmission date.  - Your physician wants you to follow-up in: 1 year Dr. Caryl Comes. You will receive a reminder letter in the mail two months in advance. If you don't receive a letter, please call our office to schedule the follow-up appointment.   Any Additional Special Instructions Will Be Listed Below (If Applicable).     If you need a refill on your cardiac medications before your next appointment, please call your pharmacy.

## 2017-02-13 LAB — CUP PACEART INCLINIC DEVICE CHECK
Brady Statistic RA Percent Paced: 1 % — CL
Date Time Interrogation Session: 20180425102008
HighPow Impedance: 57 Ohm
Implantable Lead Implant Date: 20101104
Implantable Lead Location: 753860
Implantable Lead Model: 5076
Implantable Pulse Generator Implant Date: 20101104
Lead Channel Pacing Threshold Amplitude: 0.9 V
Lead Channel Pacing Threshold Pulse Width: 0.4 ms
Lead Channel Sensing Intrinsic Amplitude: 25 mV
Lead Channel Sensing Intrinsic Amplitude: 3.9 mV
Lead Channel Setting Pacing Amplitude: 2 V
Lead Channel Setting Pacing Amplitude: 2.5 V
Lead Channel Setting Pacing Pulse Width: 0.4 ms
MDC IDC LEAD IMPLANT DT: 20101104
MDC IDC LEAD LOCATION: 753859
MDC IDC LEAD SERIAL: 301087
MDC IDC MSMT LEADCHNL RA IMPEDANCE VALUE: 489 Ohm
MDC IDC MSMT LEADCHNL RA PACING THRESHOLD PULSEWIDTH: 0.4 ms
MDC IDC MSMT LEADCHNL RV IMPEDANCE VALUE: 527 Ohm
MDC IDC MSMT LEADCHNL RV PACING THRESHOLD AMPLITUDE: 0.7 V
MDC IDC SET LEADCHNL RV SENSING SENSITIVITY: 0.4 mV
MDC IDC STAT BRADY RV PERCENT PACED: 1 % — AB
Pulse Gen Serial Number: 151503

## 2017-04-09 DIAGNOSIS — I10 Essential (primary) hypertension: Secondary | ICD-10-CM | POA: Diagnosis not present

## 2017-04-09 DIAGNOSIS — I6523 Occlusion and stenosis of bilateral carotid arteries: Secondary | ICD-10-CM | POA: Diagnosis not present

## 2017-04-09 DIAGNOSIS — I2510 Atherosclerotic heart disease of native coronary artery without angina pectoris: Secondary | ICD-10-CM | POA: Diagnosis not present

## 2017-04-09 DIAGNOSIS — E782 Mixed hyperlipidemia: Secondary | ICD-10-CM | POA: Diagnosis not present

## 2017-04-09 DIAGNOSIS — Z125 Encounter for screening for malignant neoplasm of prostate: Secondary | ICD-10-CM | POA: Diagnosis not present

## 2017-04-09 DIAGNOSIS — Z202 Contact with and (suspected) exposure to infections with a predominantly sexual mode of transmission: Secondary | ICD-10-CM | POA: Diagnosis not present

## 2017-04-09 DIAGNOSIS — Z0001 Encounter for general adult medical examination with abnormal findings: Secondary | ICD-10-CM | POA: Diagnosis not present

## 2017-04-22 ENCOUNTER — Telehealth: Payer: Self-pay | Admitting: Cardiovascular Disease

## 2017-04-22 DIAGNOSIS — I6523 Occlusion and stenosis of bilateral carotid arteries: Secondary | ICD-10-CM | POA: Diagnosis not present

## 2017-04-22 NOTE — Telephone Encounter (Signed)
Pt states he is in need of dental care and an oral surgeon will not provide care as he has an extensive cardiac history. He would like a referral to [facility] where they specialize in compromised patients. He discussed this at his last OV with Dr. Caryl Comes and has provided a form for his signature. Will notify Dr. Caryl Comes tomorrow as he will be in the [place] office.

## 2017-04-22 NOTE — Telephone Encounter (Signed)
Pt dropped dental clearance form from [facility] Needs to be signed and faxed Placed in Nurse box

## 2017-04-23 NOTE — Telephone Encounter (Signed)
Referral form given to Alvis Lemmings, RN for Dr. Olin Pia review

## 2017-04-30 ENCOUNTER — Ambulatory Visit (INDEPENDENT_AMBULATORY_CARE_PROVIDER_SITE_OTHER): Payer: Medicare HMO | Admitting: *Deleted

## 2017-04-30 ENCOUNTER — Telehealth: Payer: Self-pay | Admitting: Cardiology

## 2017-04-30 DIAGNOSIS — I255 Ischemic cardiomyopathy: Secondary | ICD-10-CM | POA: Diagnosis not present

## 2017-04-30 NOTE — Telephone Encounter (Signed)
LMOVM reminding pt to send remote transmission.   

## 2017-04-30 NOTE — Progress Notes (Signed)
Remote ICD transmission.   

## 2017-05-02 LAB — CUP PACEART REMOTE DEVICE CHECK
Battery Remaining Longevity: 54 mo
Battery Remaining Percentage: 61 %
Brady Statistic RA Percent Paced: 0 %
HighPow Impedance: 55 Ohm
Implantable Lead Implant Date: 20101104
Implantable Lead Location: 753860
Implantable Lead Model: 158
Implantable Lead Model: 5076
Implantable Pulse Generator Implant Date: 20101104
Lead Channel Impedance Value: 453 Ohm
Lead Channel Setting Pacing Amplitude: 2 V
Lead Channel Setting Pacing Amplitude: 2.5 V
Lead Channel Setting Pacing Pulse Width: 0.4 ms
MDC IDC LEAD IMPLANT DT: 20101104
MDC IDC LEAD LOCATION: 753859
MDC IDC LEAD SERIAL: 301087
MDC IDC MSMT LEADCHNL RA PACING THRESHOLD AMPLITUDE: 0.9 V
MDC IDC MSMT LEADCHNL RA PACING THRESHOLD PULSEWIDTH: 0.4 ms
MDC IDC MSMT LEADCHNL RV IMPEDANCE VALUE: 540 Ohm
MDC IDC MSMT LEADCHNL RV PACING THRESHOLD AMPLITUDE: 0.7 V
MDC IDC MSMT LEADCHNL RV PACING THRESHOLD PULSEWIDTH: 0.4 ms
MDC IDC PG SERIAL: 151503
MDC IDC SESS DTM: 20180710091600
MDC IDC SET LEADCHNL RV SENSING SENSITIVITY: 0.4 mV
MDC IDC STAT BRADY RV PERCENT PACED: 0 %

## 2017-05-06 ENCOUNTER — Encounter: Payer: Self-pay | Admitting: Cardiology

## 2017-07-17 ENCOUNTER — Telehealth: Payer: Self-pay | Admitting: Cardiovascular Disease

## 2017-07-17 NOTE — Telephone Encounter (Signed)
Patient dropped off clearance for dental procedure   Placed patent form in nurse box

## 2017-07-19 NOTE — Telephone Encounter (Signed)
Placed in Dr. Tyrell Antonio basket

## 2017-07-23 NOTE — Telephone Encounter (Signed)
Faxed Medical Clearance for dental treatment to [facility] dental clinic, [phone]

## 2017-07-30 ENCOUNTER — Ambulatory Visit (INDEPENDENT_AMBULATORY_CARE_PROVIDER_SITE_OTHER): Payer: Medicare HMO | Admitting: *Deleted

## 2017-07-30 DIAGNOSIS — I255 Ischemic cardiomyopathy: Secondary | ICD-10-CM

## 2017-07-30 NOTE — Progress Notes (Signed)
Remote ICD transmission.   

## 2017-07-31 LAB — CUP PACEART REMOTE DEVICE CHECK
Battery Remaining Longevity: 48 mo
Battery Remaining Percentage: 60 %
Brady Statistic RA Percent Paced: 0 %
Brady Statistic RV Percent Paced: 0 %
HIGH POWER IMPEDANCE MEASURED VALUE: 57 Ohm
Implantable Lead Implant Date: 20101104
Implantable Lead Location: 753860
Implantable Lead Model: 5076
Implantable Lead Serial Number: 301087
Implantable Pulse Generator Implant Date: 20101104
Lead Channel Impedance Value: 484 Ohm
Lead Channel Impedance Value: 528 Ohm
Lead Channel Pacing Threshold Amplitude: 0.9 V
Lead Channel Pacing Threshold Pulse Width: 0.4 ms
Lead Channel Pacing Threshold Pulse Width: 0.4 ms
Lead Channel Setting Pacing Amplitude: 2.5 V
Lead Channel Setting Pacing Pulse Width: 0.4 ms
Lead Channel Setting Sensing Sensitivity: 0.4 mV
MDC IDC LEAD IMPLANT DT: 20101104
MDC IDC LEAD LOCATION: 753859
MDC IDC MSMT LEADCHNL RV PACING THRESHOLD AMPLITUDE: 0.7 V
MDC IDC SESS DTM: 20181009131700
MDC IDC SET LEADCHNL RA PACING AMPLITUDE: 2 V
Pulse Gen Serial Number: 151503

## 2017-08-02 ENCOUNTER — Encounter: Payer: Self-pay | Admitting: Cardiology

## 2017-10-18 ENCOUNTER — Encounter: Payer: Self-pay | Admitting: Nurse Practitioner

## 2017-10-18 DIAGNOSIS — R7301 Impaired fasting glucose: Secondary | ICD-10-CM | POA: Insufficient documentation

## 2017-10-18 DIAGNOSIS — I2510 Atherosclerotic heart disease of native coronary artery without angina pectoris: Secondary | ICD-10-CM | POA: Insufficient documentation

## 2017-10-18 DIAGNOSIS — I6523 Occlusion and stenosis of bilateral carotid arteries: Secondary | ICD-10-CM | POA: Insufficient documentation

## 2017-10-29 ENCOUNTER — Telehealth: Payer: Self-pay | Admitting: Cardiology

## 2017-10-29 ENCOUNTER — Encounter: Payer: Medicare HMO | Admitting: *Deleted

## 2017-10-29 NOTE — Telephone Encounter (Signed)
Spoke with pt and reminded pt of remote transmission that is due today. Pt verbalized understanding.   

## 2017-10-31 ENCOUNTER — Encounter: Payer: Self-pay | Admitting: Cardiology

## 2017-11-15 ENCOUNTER — Ambulatory Visit (INDEPENDENT_AMBULATORY_CARE_PROVIDER_SITE_OTHER): Payer: Medicare HMO | Admitting: *Deleted

## 2017-11-15 DIAGNOSIS — I255 Ischemic cardiomyopathy: Secondary | ICD-10-CM

## 2017-11-15 NOTE — Progress Notes (Signed)
Remote ICD transmission.   

## 2017-11-18 ENCOUNTER — Encounter: Payer: Self-pay | Admitting: Cardiology

## 2017-11-25 LAB — CUP PACEART REMOTE DEVICE CHECK
Implantable Lead Implant Date: 20101104
Implantable Lead Location: 753859
Implantable Lead Location: 753860
Implantable Lead Model: 5076
Lead Channel Setting Pacing Amplitude: 2.5 V
Lead Channel Setting Pacing Pulse Width: 0.4 ms
MDC IDC LEAD IMPLANT DT: 20101104
MDC IDC LEAD SERIAL: 301087
MDC IDC PG IMPLANT DT: 20101104
MDC IDC SESS DTM: 20190204210852
MDC IDC SET LEADCHNL RA PACING AMPLITUDE: 2 V
MDC IDC SET LEADCHNL RV SENSING SENSITIVITY: 0.4 mV
Pulse Gen Serial Number: 151503

## 2018-02-17 ENCOUNTER — Encounter: Payer: Medicare HMO | Admitting: *Deleted

## 2018-02-17 ENCOUNTER — Telehealth: Payer: Self-pay | Admitting: Cardiology

## 2018-02-17 NOTE — Telephone Encounter (Signed)
LMOVM reminding pt to send remote transmission.   

## 2018-02-20 ENCOUNTER — Encounter: Payer: Self-pay | Admitting: Cardiology

## 2018-03-27 ENCOUNTER — Ambulatory Visit (INDEPENDENT_AMBULATORY_CARE_PROVIDER_SITE_OTHER): Payer: Medicare HMO | Admitting: *Deleted

## 2018-03-27 DIAGNOSIS — I255 Ischemic cardiomyopathy: Secondary | ICD-10-CM

## 2018-03-28 ENCOUNTER — Encounter: Payer: Self-pay | Admitting: Cardiology

## 2018-03-28 NOTE — Progress Notes (Signed)
Remote ICD transmission.   

## 2018-04-22 ENCOUNTER — Ambulatory Visit: Payer: Medicare HMO | Admitting: Cardiovascular Disease

## 2018-05-16 LAB — CUP PACEART REMOTE DEVICE CHECK
Date Time Interrogation Session: 20190726202342
Implantable Lead Location: 753860
Implantable Pulse Generator Implant Date: 20101104
MDC IDC LEAD IMPLANT DT: 20101104
MDC IDC LEAD IMPLANT DT: 20101104
MDC IDC LEAD LOCATION: 753859
MDC IDC LEAD SERIAL: 301087
Pulse Gen Serial Number: 151503

## 2018-05-26 ENCOUNTER — Encounter: Payer: Self-pay | Admitting: Physician Assistant

## 2018-05-26 NOTE — Progress Notes (Signed)
Cardiology Office Note Date:  05/27/2018  Patient ID:  [name], [name] [birth year], MRN [id number] PCP:  Lavera Guise, MD  Cardiologist:  Dr. Fletcher Anon, MD Electrophysiologist: Dr. Caryl Comes, MD    Chief Complaint: Follow up  History of Present Illness: [name] [name] is a 57 y.o. male with history of CAD s/p CABG, HFrEF due to ICM s/p ICD for primary prevention, mitral valve repair in 2010, prior apical thrombus s/p Coumadin therapy, and HLD who presents for follow up of his CAD and ICM.  Patient underwent 2-vessel CABG (LIMA-LAD, RIMA-RCA) and mitral valve repair in 2010. Prior EF noted to be 20% with apical thrombus. He underwent ICD as part of MADIT-RIT trial for primary prevention. Echo from 01/2016 showed an improved EF of 40-45%, anterior and anteroseptal hypokinesis, mild AI, intact mitral valve repair, no significant pulmonary hypertension, no evidence of apical thrombus. His Coumadin was stopped at that time. He was most recently seen by Dr. Fletcher Anon in 12/2016 and was doing well at that time. Unfortunately, he continued to smoke. He was seen by Dr. Caryl Comes in 01/2017 and was doing well. Last device interrogation from 03/2018 showed 10 episodes of NSVT, 13 minutes of atrial tachycardia and no therapy delivered.   Labs 12/2016: LDL 81, LFT normal, SCr 0.77, K+ 4.2  He comes in doing well today.  He has not had any chest pain, shortness of breath, palpitations, dizziness, presyncope, or syncope.  No ICD shocks.  He has subsequently undergone complex maxillofacial surgery through [facility] secondary to numerous martial arts injuries.  Weight has remained stable.  He is compliant and tolerating all medications.  Blood pressure typically runs in the low 202R systolic.  He is asymptomatic without any symptoms of orthostasis.  No recent falls, BRBPR or melena.  Records from PCP show labs collected in 03/2017 with a serum creatinine of 1.09, potassium 5.4, glucose 113, normal LFT, hemoglobin 14.9, LDL 70, normal  TSH.  Carotid artery ultrasound from 04/2017 demonstrated no significant carotid artery stenosis along the right side with 50 to 60% stenosis along the left internal carotid artery.  He continues to smoke half pack of cigarettes daily and is now somewhat open to quitting.  He indicates his biggest trigger is stress.  He does not have any questions or concerns today.  Past Medical History:  Diagnosis Date  . Atrial fibrillation ([place])    a. s/p CABG  . Cardiac defibrillator in situ    MADIT RIT trial  . Coronary artery disease    a. s/p 2-V CABG (LIMA-LAD, RIMA-RCA) 2010  . Hyperlipidemia   . Ischemic cardiomyopathy    a. prior EF 20%; b. TTE 4/17: EF 40-45%, ant/antoseptal HK, mild AI, intact mitral valve repair, no sig pulm htn, no evidence of apical thrombus   . Mitral valve disease    a. s/p repair 2010 at time of bypass  . Post-infarction apical thrombus ([place])    a. s/p Coumadin    Past Surgical History:  Procedure Laterality Date  . CARDIAC DEFIBRILLATOR PLACEMENT     [employer]  . COLONOSCOPY WITH PROPOFOL N/A 07/31/2016   Procedure: COLONOSCOPY WITH PROPOFOL;  Surgeon: Lollie Sails, MD;  Location: [facility] ENDOSCOPY;  Service: Endoscopy;  Laterality: N/A;  . CORONARY ARTERY BYPASS GRAFT  2010   LIMA to LAD, RIMA to RCA and mitral valve repair  . KNEE SURGERY    . MITRAL VALVE REPAIR  2010    Current Meds  Medication Sig  .  aspirin EC 81 MG tablet Take 1 tablet (81 mg total) by mouth daily.  Marland Kitchen atorvastatin (LIPITOR) 80 MG tablet TAKE 1 TABLET (80 MG TOTAL) BY MOUTH DAILY.  . carvedilol (COREG) 25 MG tablet Take 1 tablet (25 mg total) by mouth 2 (two) times daily with a meal.  . enalapril (VASOTEC) 20 MG tablet Take 1 tablet in am, 1/2 tablet in pm for a total of 30 mg daily formulary change for 2015  . nitroGLYCERIN (NITROSTAT) 0.4 MG SL tablet Place 0.4 mg under the tongue every 5 (five) minutes as needed.    . Omega-3 Fatty Acids (FISH OIL) 1200 MG CAPS Take 2,400  mg by mouth daily.   Marland Kitchen spironolactone (ALDACTONE) 25 MG tablet TAKE 1 TABLET (25 MG TOTAL) BY MOUTH DAILY.    Allergies:   Patient has no known allergies.   Social History:  The patient  reports that he has been smoking cigarettes.  He has a 37.00 pack-year smoking history. He has never used smokeless tobacco. He reports that he does not drink alcohol or use drugs.   Family History:  The patient's family history includes Heart attack in his mother; Heart attack (age of onset: 62) in his father; Heart failure in his mother; Hyperlipidemia in his other.  ROS:   Review of Systems  Constitutional: Negative for chills, diaphoresis, fever, malaise/fatigue and weight loss.  HENT: Negative for congestion.   Eyes: Negative for discharge and redness.  Respiratory: Negative for cough, hemoptysis, sputum production, shortness of breath and wheezing.   Cardiovascular: Negative for chest pain, palpitations, orthopnea, claudication, leg swelling and PND.  Gastrointestinal: Negative for abdominal pain, blood in stool, heartburn, melena, nausea and vomiting.  Genitourinary: Negative for hematuria.  Musculoskeletal: Negative for falls and myalgias.  Skin: Negative for rash.  Neurological: Negative for dizziness, tingling, tremors, sensory change, speech change, focal weakness, loss of consciousness and weakness.  Endo/Heme/Allergies: Does not bruise/bleed easily.  Psychiatric/Behavioral: Negative for substance abuse. The patient is not nervous/anxious.   All other systems reviewed and are negative.    PHYSICAL EXAM:  VS:  BP 100/60 (BP Location: Left Arm, Patient Position: Sitting, Cuff Size: Normal)   Pulse 82   Ht 5\' 10"  (1.778 m)   Wt 191 lb 4 oz (86.8 kg)   BMI 27.44 kg/m  BMI: Body mass index is 27.44 kg/m.  Physical Exam  Constitutional: He is oriented to person, place, and time. He appears well-developed and well-nourished.  HENT:  Head: Normocephalic and atraumatic.  Eyes: Right eye  exhibits no discharge. Left eye exhibits no discharge.  Neck: Normal range of motion. No JVD present.  Cardiovascular: Normal rate, regular rhythm, S1 normal, S2 normal and normal heart sounds. Exam reveals no distant heart sounds, no friction rub, no midsystolic click and no opening snap.  No murmur heard. Pulses:      Posterior tibial pulses are 2+ on the right side, and 2+ on the left side.  Pulmonary/Chest: Effort normal and breath sounds normal. No respiratory distress. He has no decreased breath sounds. He has no wheezes. He has no rales. He exhibits no tenderness.  Abdominal: Soft. He exhibits no distension. There is no tenderness.  Musculoskeletal: He exhibits no edema.  Neurological: He is alert and oriented to person, place, and time.  Skin: Skin is warm and dry. No cyanosis. Nails show no clubbing.  Psychiatric: He has a normal mood and affect. His speech is normal and behavior is normal. Judgment and thought content normal.  EKG:  Was ordered and interpreted by me today. Shows NSR, 82 bpm, possible old anterior and inferior infarcts, anterolateral TWI (unchanged from prior)  Recent Labs: No results found for requested labs within last 8760 hours.  No results found for requested labs within last 8760 hours.   CrCl cannot be calculated (Patient's most recent lab result is older than the maximum 21 days allowed.).   Wt Readings from Last 3 Encounters:  05/27/18 191 lb 4 oz (86.8 kg)  01/29/17 193 lb 8 oz (87.8 kg)  01/14/17 195 lb 8 oz (88.7 kg)     Other studies reviewed: Additional studies/records reviewed today include: summarized above  ASSESSMENT AND PLAN:  1. CAD involving the native coronary arteries status post bypass without angina: He is doing well without any symptoms suggestive of angina.  Continue medical therapy with aspirin, Lipitor, carvedilol, enalapril, and sublingual nitroglycerin as needed.  Continue aggressive risk factor modification including  complete smoking cessation as detailed below as well as secondary prevention.  No plans for ischemic evaluation at this time.  2. HFrEF secondary to ischemic cardiomyopathy: Most recent EF of 40 to 45% by echocardiogram in 2017.  He does not appear grossly volume overloaded at this time.  Continue carvedilol, enalapril, and spironolactone.  CHF education discussed.  Daily weights.  Check CMP.  3. Status post ICD: Followed by EP.  Recommend he schedule an appointment.  No ICD shocks delivered.  4. Hypertension: Blood pressure is on the soft side today at 100/60.  He is completely asymptomatic.  Continue current medications as above.  5. Hyperlipidemia: Most recent LDL at goal of 70 from 03/2017.  Check lipid panel and liver function as above.  Continue Lipitor 80 mg daily.  6. Carotid artery disease: No bruit heard on exam.  Prior carotid artery ultrasound from outside office indicated a 50 to 60% left internal carotid artery stenosis with recommendation to follow-up in 6 to 12 months.  We will schedule a carotid artery ultrasound to trend this.  Continue Lipitor as above.  7. Tobacco abuse: Complete cessation is advised.  Patient is now more open to this.  He will try Nicorette gum/lozenges initially.  Disposition: F/u with Dr. Caryl Comes in 2 to 3 months and Dr. Fletcher Anon in 6 to 12 months.    Current medicines are reviewed at length with the patient today.  The patient did not have any concerns regarding medicines.  Signed, Christell Faith, PA-C 05/27/2018 3:12 PM     [address] [place], [place] [address] [phone]

## 2018-05-27 ENCOUNTER — Encounter: Payer: Self-pay | Admitting: Physician Assistant

## 2018-05-27 ENCOUNTER — Ambulatory Visit: Payer: Medicare HMO | Admitting: Physician Assistant

## 2018-05-27 VITALS — BP 100/60 | HR 82 | Ht 70.0 in | Wt 191.2 lb

## 2018-05-27 DIAGNOSIS — I779 Disorder of arteries and arterioles, unspecified: Secondary | ICD-10-CM

## 2018-05-27 DIAGNOSIS — Z95810 Presence of automatic (implantable) cardiac defibrillator: Secondary | ICD-10-CM | POA: Diagnosis not present

## 2018-05-27 DIAGNOSIS — I255 Ischemic cardiomyopathy: Secondary | ICD-10-CM

## 2018-05-27 DIAGNOSIS — I2510 Atherosclerotic heart disease of native coronary artery without angina pectoris: Secondary | ICD-10-CM

## 2018-05-27 DIAGNOSIS — I5022 Chronic systolic (congestive) heart failure: Secondary | ICD-10-CM | POA: Diagnosis not present

## 2018-05-27 DIAGNOSIS — Z720 Tobacco use: Secondary | ICD-10-CM | POA: Diagnosis not present

## 2018-05-27 DIAGNOSIS — I10 Essential (primary) hypertension: Secondary | ICD-10-CM

## 2018-05-27 DIAGNOSIS — I6522 Occlusion and stenosis of left carotid artery: Secondary | ICD-10-CM | POA: Diagnosis not present

## 2018-05-27 DIAGNOSIS — E785 Hyperlipidemia, unspecified: Secondary | ICD-10-CM | POA: Diagnosis not present

## 2018-05-27 MED ORDER — ATORVASTATIN CALCIUM 80 MG PO TABS: ORAL_TABLET | ORAL | 3 refills | Status: DC

## 2018-05-27 MED ORDER — CARVEDILOL 25 MG PO TABS: 25.0000 mg | ORAL_TABLET | Freq: Two times a day (BID) | ORAL | 3 refills | Status: DC

## 2018-05-27 MED ORDER — ENALAPRIL MALEATE 20 MG PO TABS: ORAL_TABLET | ORAL | 3 refills | Status: DC

## 2018-05-27 MED ORDER — SPIRONOLACTONE 25 MG PO TABS: ORAL_TABLET | ORAL | 3 refills | Status: DC

## 2018-05-27 NOTE — Patient Instructions (Signed)
Medication Instructions: Your physician recommends that you continue on your current medications as directed. Please refer to the Current Medication list given to you today.  If you need a refill on your cardiac medications before your next appointment, please call your pharmacy.   Labwork: Your provider would like for you to have the following labs today: CMET and Lipid   Procedures/Testing: Your physician has requested that you have a carotid duplex. This test is an ultrasound of the carotid arteries in your neck. It looks at blood flow through these arteries that supply the brain with blood. Allow one hour for this exam. There are no restrictions or special instructions. This will take place at [address], the [facility] office.  Follow-Up: Your physician wants you to follow-up in 6 months with Dr Fletcher Anon and a follow up with Dr. Caryl Comes. You will receive a reminder letter in the mail two months in advance. If you don't receive a letter, please call our office at [phone] to schedule this follow-up appointment.   Special Instructions: Please look into buying Nicorette gum or lozenges.    Thank you for choosing [facility] at [facility]!

## 2018-05-28 ENCOUNTER — Telehealth: Payer: Self-pay | Admitting: Cardiovascular Disease

## 2018-05-28 LAB — COMPREHENSIVE METABOLIC PANEL
A/G RATIO: 1.6 (ref 1.2–2.2)
ALT: 18 IU/L (ref 0–44)
AST: 16 IU/L (ref 0–40)
Albumin: 4.4 g/dL (ref 3.5–5.5)
Alkaline Phosphatase: 71 IU/L (ref 39–117)
BUN/Creatinine Ratio: 19 (ref 9–20)
BUN: 18 mg/dL (ref 6–24)
Bilirubin Total: 0.4 mg/dL (ref 0.0–1.2)
CALCIUM: 10.2 mg/dL (ref 8.7–10.2)
CO2: 21 mmol/L (ref 20–29)
Chloride: 104 mmol/L (ref 96–106)
Creatinine, Ser: 0.97 mg/dL (ref 0.76–1.27)
GFR calc Af Amer: 98 mL/min/{1.73_m2} (ref >59)
GFR, EST NON AFRICAN AMERICAN: 85 mL/min/{1.73_m2} (ref >59)
Globulin, Total: 2.7 g/dL (ref 1.5–4.5)
Glucose: 99 mg/dL (ref 65–99)
POTASSIUM: 4.7 mmol/L (ref 3.5–5.2)
Sodium: 141 mmol/L (ref 134–144)
Total Protein: 7.1 g/dL (ref 6.0–8.5)

## 2018-05-28 LAB — LIPID PANEL
CHOL/HDL RATIO: 5.4 ratio — AB (ref 0.0–5.0)
CHOLESTEROL TOTAL: 223 mg/dL — AB (ref 100–199)
HDL: 41 mg/dL (ref >39)
LDL CALC: 109 mg/dL — AB (ref 0–99)
TRIGLYCERIDES: 366 mg/dL — AB (ref 0–149)
VLDL Cholesterol Cal: 73 mg/dL — ABNORMAL HIGH (ref 5–40)

## 2018-05-28 NOTE — Telephone Encounter (Signed)
Patient calling [name] [name] yesterday and he went over his LDL which was a 33 Patient would like to know what his HDL was Please call to discuss

## 2018-05-28 NOTE — Telephone Encounter (Signed)
S/w patient and gave him the numbers of HDL and LDL from last lipid panel. He was very appreciative and had no further questions.

## 2018-05-30 ENCOUNTER — Telehealth: Payer: Self-pay | Admitting: Cardiovascular Disease

## 2018-05-30 DIAGNOSIS — E785 Hyperlipidemia, unspecified: Secondary | ICD-10-CM

## 2018-05-30 MED ORDER — ROSUVASTATIN CALCIUM 40 MG PO TABS: 40.0000 mg | ORAL_TABLET | Freq: Every day | ORAL | 3 refills | Status: DC

## 2018-05-30 MED ORDER — EZETIMIBE 10 MG PO TABS: 10.0000 mg | ORAL_TABLET | Freq: Every day | ORAL | 3 refills | Status: DC

## 2018-05-30 NOTE — Telephone Encounter (Signed)
Pt is returning the call regarding his lab results.

## 2018-05-30 NOTE — Telephone Encounter (Signed)
Patient made aware of results and verbalized understanding.  Crestor 40 mg daily and Zetia 10 mg daily have been sent into [facility]. The patient will come back in 3 months for fasting labs. Orders have been placed.   Notes recorded by Rise Mu, PA-C on 05/29/2018 at 4:44 PM EDT LDL is not at goal of < 70. Please change from Lipitor 80 mg daily to Crestor 40 mg daily and add Zetia 10 mg daily. Recheck fasting lipid and liver function in 12 weeks. If LDL is not at goal of < 70 at that time we will need to change him over to a PCSK9 inhibitor.

## 2018-06-01 LAB — LDL CHOLESTEROL, DIRECT: LDL Direct: 133 mg/dL — ABNORMAL HIGH (ref 0–99)

## 2018-06-01 LAB — SPECIMEN STATUS REPORT

## 2018-06-03 ENCOUNTER — Other Ambulatory Visit: Payer: Self-pay | Admitting: Physician Assistant

## 2018-06-03 DIAGNOSIS — I6523 Occlusion and stenosis of bilateral carotid arteries: Secondary | ICD-10-CM

## 2018-06-10 ENCOUNTER — Encounter: Payer: Self-pay | Admitting: Internal Medicine

## 2018-06-10 ENCOUNTER — Ambulatory Visit (INDEPENDENT_AMBULATORY_CARE_PROVIDER_SITE_OTHER): Payer: Medicare HMO

## 2018-06-10 ENCOUNTER — Ambulatory Visit: Payer: Medicare HMO | Admitting: Internal Medicine

## 2018-06-10 VITALS — BP 98/58 | HR 67 | Ht 70.0 in | Wt 194.0 lb

## 2018-06-10 DIAGNOSIS — I255 Ischemic cardiomyopathy: Secondary | ICD-10-CM

## 2018-06-10 DIAGNOSIS — I6523 Occlusion and stenosis of bilateral carotid arteries: Secondary | ICD-10-CM | POA: Diagnosis not present

## 2018-06-10 DIAGNOSIS — Z95810 Presence of automatic (implantable) cardiac defibrillator: Secondary | ICD-10-CM

## 2018-06-10 LAB — CUP PACEART INCLINIC DEVICE CHECK
Date Time Interrogation Session: 20190820040000
HIGH POWER IMPEDANCE MEASURED VALUE: 41 Ohm
HIGH POWER IMPEDANCE MEASURED VALUE: 52 Ohm
Implantable Lead Implant Date: 20101104
Implantable Lead Location: 753859
Implantable Lead Serial Number: 301087
Implantable Pulse Generator Implant Date: 20101104
Lead Channel Impedance Value: 495 Ohm
Lead Channel Pacing Threshold Amplitude: 0.6 V
Lead Channel Pacing Threshold Pulse Width: 0.4 ms
Lead Channel Pacing Threshold Pulse Width: 0.4 ms
Lead Channel Sensing Intrinsic Amplitude: 25 mV
Lead Channel Setting Pacing Amplitude: 2.5 V
Lead Channel Setting Sensing Sensitivity: 0.4 mV
MDC IDC LEAD IMPLANT DT: 20101104
MDC IDC LEAD LOCATION: 753860
MDC IDC MSMT LEADCHNL RA IMPEDANCE VALUE: 451 Ohm
MDC IDC MSMT LEADCHNL RA PACING THRESHOLD AMPLITUDE: 0.9 V
MDC IDC MSMT LEADCHNL RA SENSING INTR AMPL: 3.2 mV
MDC IDC SET LEADCHNL RA PACING AMPLITUDE: 2 V
MDC IDC SET LEADCHNL RV PACING PULSEWIDTH: 0.4 ms
MDC IDC STAT BRADY RA PERCENT PACED: 1 % — AB
MDC IDC STAT BRADY RV PERCENT PACED: 1 % — AB
Pulse Gen Serial Number: 151503

## 2018-06-10 MED ORDER — ENALAPRIL MALEATE 20 MG PO TABS: ORAL_TABLET | ORAL | 3 refills | Status: DC

## 2018-06-10 NOTE — Patient Instructions (Signed)
Medication Instructions: - Your physician has recommended you make the following change in your medication:   1) DECREASE enalapril 20 mg- take 1 tablet by mouth once daily at night  Labwork: - none ordered  Procedures/Testing: - none ordered  Follow-Up: - Remote monitoring is used to monitor your Pacemaker of ICD from home. This monitoring reduces the number of office visits required to check your device to one time per year. It allows [place] to keep an eye on the functioning of your device to ensure it is working properly. You are scheduled for a device check from home on 06/26/18. You may send your transmission at any time that day. If you have a wireless device, the transmission will be sent automatically. After your physician reviews your transmission, you will receive a postcard with your next transmission date.  - Your physician wants you to follow-up in: 1 year with Dr. Caryl Comes. You will receive a reminder letter/ call  two months in advance. If you don't receive a letter/ call, please call our office to schedule the follow-up appointment.   Any Additional Special Instructions Will Be Listed Below (If Applicable).     If you need a refill on your cardiac medications before your next appointment, please call your pharmacy.

## 2018-06-10 NOTE — Progress Notes (Signed)
Patient Care Team: Lavera Guise, MD as PCP - General (Internal Medicine) Wellington Hampshire, MD as Consulting Physician (Cardiology)   HPI  [name] [name] is a 57 y.o. male Seen in followup for ICD implanted originally as part of MADIT-RIT for primary prevention with ischemic cardiomyopathy with previous ejection fraction of 20%   He has  coronary artery disease s/p CABG and mitral valve repair in 2010; 4/17  Echo 40-45%, previous apical thrombus  and ICD placement for primary prevention.   Tolerating meds  Date Cr K LDL  3/18  0.77 4.2    8/19 0.97 4.7 109   The patient denies chest pain, shortness of breath, nocturnal dyspnea, orthopnea or peripheral edema.  There have been no palpitations, lightheadedness or syncope.   Working on stopping smoking  And BP is going down    Records and Results Reviewed Outpt records  Past Medical History:  Diagnosis Date  . Atrial fibrillation ([place])    a. s/p CABG  . Cardiac defibrillator in situ    MADIT RIT trial  . Coronary artery disease    a. s/p 2-V CABG (LIMA-LAD, RIMA-RCA) 2010  . Hyperlipidemia   . Ischemic cardiomyopathy    a. prior EF 20%; b. TTE 4/17: EF 40-45%, ant/antoseptal HK, mild AI, intact mitral valve repair, no sig pulm htn, no evidence of apical thrombus   . Mitral valve disease    a. s/p repair 2010 at time of bypass  . Post-infarction apical thrombus ([place])    a. s/p Coumadin    Past Surgical History:  Procedure Laterality Date  . CARDIAC DEFIBRILLATOR PLACEMENT     [employer]  . COLONOSCOPY WITH PROPOFOL N/A 07/31/2016   Procedure: COLONOSCOPY WITH PROPOFOL;  Surgeon: Lollie Sails, MD;  Location: [facility] ENDOSCOPY;  Service: Endoscopy;  Laterality: N/A;  . CORONARY ARTERY BYPASS GRAFT  2010   LIMA to LAD, RIMA to RCA and mitral valve repair  . KNEE SURGERY    . MITRAL VALVE REPAIR  2010    Current Outpatient Medications  Medication Sig Dispense Refill  . aspirin EC 81 MG tablet Take 1  tablet (81 mg total) by mouth daily. 90 tablet 3  . atorvastatin (LIPITOR) 80 MG tablet Take 80 mg by mouth daily.     . carvedilol (COREG) 25 MG tablet Take 1 tablet (25 mg total) by mouth 2 (two) times daily with a meal. 180 tablet 3  . enalapril (VASOTEC) 20 MG tablet Take 1 tablet in am, 1/2 tablet in pm for a total of 30 mg daily formulary change for 2015 (Patient taking differently: Take 20 mg by mouth 2 (two) times daily. ) 90 tablet 3  . nitroGLYCERIN (NITROSTAT) 0.4 MG SL tablet Place 0.4 mg under the tongue every 5 (five) minutes as needed.      . Omega-3 Fatty Acids (FISH OIL) 1200 MG CAPS Take 2,400 mg by mouth daily.     Marland Kitchen spironolactone (ALDACTONE) 25 MG tablet TAKE 1 TABLET (25 MG TOTAL) BY MOUTH DAILY. 90 tablet 3  . ezetimibe (ZETIA) 10 MG tablet Take 1 tablet (10 mg total) by mouth daily. (Patient not taking: Reported on 06/10/2018) 90 tablet 3  . rosuvastatin (CRESTOR) 40 MG tablet Take 1 tablet (40 mg total) by mouth daily. (Patient not taking: Reported on 06/10/2018) 90 tablet 3   No current facility-administered medications for this visit.     No Known Allergies    Review of Systems  negative except from HPI and PMH  Physical Exam BP (!) 98/58 (BP Location: Left Arm, Patient Position: Sitting, Cuff Size: Normal)   Pulse 67   Ht 5\' 10"  (1.778 m)   Wt 194 lb (88 kg)   BMI 27.84 kg/m  Well developed and nourished in no acute distress HENT normal Neck supple with JVP-flat Clear Regular rate and rhythm, no murmurs or gallops Abd-soft with active BS No Clubbing cyanosis edema Skin-warm and dry A & Oriented  Grossly normal sensory and motor function   ECG personally reviewed sinus @ 676 13/11/42 Anterolateral T W [place]   Without change    Assessment and Plan:   Ischemic cardiomyopathy   Hypotension  relative  Implantable defibrillator-[employer]  The patient's device was interrogated.  The information was reviewed. No changes were made in the  programming.     Device failure  There appears to be data corruption   BS involved    Without symptoms of ischemia  Will have him take enalapril 2o daily << bid with low blood pressure    .       Current medicines are reviewed at length with the patient today .  The patient does not  have concerns regarding medicines.

## 2018-06-11 ENCOUNTER — Other Ambulatory Visit: Payer: Self-pay | Admitting: Physician Assistant

## 2018-06-16 ENCOUNTER — Telehealth: Payer: Self-pay | Admitting: Internal Medicine

## 2018-06-16 MED ORDER — ENALAPRIL MALEATE 10 MG PO TABS: ORAL_TABLET | ORAL | 3 refills | Status: DC

## 2018-06-16 NOTE — Telephone Encounter (Signed)
I spoke with the patient. He states he just received a RX from the pharmacy for enalapril 20 mg tablets - take 1 tablet twice daily. He states he has never taken this much.  He wanted to clarify how he should be taking his medication as he was taking enalapril 10 mg BID, and he cut this back to 10 mg once daily at night.  I advised the patient that his intake medication list from his last visit states that he was taking enalapril 20 mg BID, therefore Dr. Caryl Comes advised him to cut back to 20 mg once daily at night due to his low BP. I did discuss the dose change with the patient and he did not correct me at time. He states he reviewed everything recently when his refill came in and realized what we had instructed him to do by taking enalapril 20 mg once daily due to his low BP would not be in his best interest. I advised this is correct and that if he was taking enalapril 10 mg BID that he should continue with taking the lower dose of 10 mg once daily at night.  I inquired if received a medication list at check in to review and he advised her did not. I also inquired if the person working him up that day went over the medications & the doses, or were the medication names just called out.  The patient advised the names were just called out.   The patient is aware that I will correct his enalapril RX with [facility].  The patient is aware and agreeable.

## 2018-06-16 NOTE — Telephone Encounter (Signed)
Pt c/o medication issue:  1. Name of Medication  Vasotec 2. How are you currently taking this medication (dosage and times per day)? Original rx 10 mg po BID changed at recent ov   3. Are you having a reaction (difficulty breathing--STAT)? no 4. What is your medication issue? Patient received mail order rx stating to take vasotec 20 mg in the am and 10 mg in pm, but this is not how it is listed in med list and this would not be a DECREASE as patient thought he was told to do.  Please call to confirm new dosage.

## 2018-06-26 ENCOUNTER — Ambulatory Visit (INDEPENDENT_AMBULATORY_CARE_PROVIDER_SITE_OTHER): Payer: Medicare HMO | Admitting: *Deleted

## 2018-06-26 DIAGNOSIS — I255 Ischemic cardiomyopathy: Secondary | ICD-10-CM | POA: Diagnosis not present

## 2018-06-26 DIAGNOSIS — I5022 Chronic systolic (congestive) heart failure: Secondary | ICD-10-CM

## 2018-06-26 NOTE — Progress Notes (Signed)
Remote ICD transmission.   

## 2018-07-22 LAB — CUP PACEART REMOTE DEVICE CHECK
Implantable Lead Implant Date: 20101104
Implantable Lead Location: 753860
Implantable Lead Serial Number: 301087
Implantable Pulse Generator Implant Date: 20101104
MDC IDC LEAD IMPLANT DT: 20101104
MDC IDC LEAD LOCATION: 753859
MDC IDC SESS DTM: 20191001102532
Pulse Gen Serial Number: 151503

## 2018-09-25 ENCOUNTER — Telehealth: Payer: Self-pay

## 2018-09-25 NOTE — Telephone Encounter (Signed)
Attempted to confirm remote transmission with pt. No answer and was unable to leave a message.   

## 2018-09-30 ENCOUNTER — Encounter: Payer: Self-pay | Admitting: Cardiology

## 2018-10-13 ENCOUNTER — Ambulatory Visit (INDEPENDENT_AMBULATORY_CARE_PROVIDER_SITE_OTHER): Payer: Medicare HMO

## 2018-10-13 DIAGNOSIS — I255 Ischemic cardiomyopathy: Secondary | ICD-10-CM | POA: Diagnosis not present

## 2018-10-14 NOTE — Progress Notes (Signed)
Remote ICD transmission.   

## 2018-10-17 LAB — CUP PACEART REMOTE DEVICE CHECK
Date Time Interrogation Session: 20191227142009
Implantable Lead Implant Date: 20101104
Implantable Lead Location: 753859
Implantable Lead Serial Number: 301087
MDC IDC LEAD IMPLANT DT: 20101104
MDC IDC LEAD LOCATION: 753860
MDC IDC PG IMPLANT DT: 20101104
Pulse Gen Serial Number: 151503

## 2019-01-12 ENCOUNTER — Encounter: Payer: Medicare HMO | Admitting: *Deleted

## 2019-01-12 ENCOUNTER — Other Ambulatory Visit: Payer: Self-pay

## 2019-01-13 ENCOUNTER — Telehealth: Payer: Self-pay

## 2019-01-13 NOTE — Telephone Encounter (Signed)
Spoke with patient to remind of missed remote transmission 

## 2019-01-19 ENCOUNTER — Ambulatory Visit (INDEPENDENT_AMBULATORY_CARE_PROVIDER_SITE_OTHER): Payer: Medicare HMO | Admitting: *Deleted

## 2019-01-19 DIAGNOSIS — I255 Ischemic cardiomyopathy: Secondary | ICD-10-CM

## 2019-01-19 DIAGNOSIS — I5022 Chronic systolic (congestive) heart failure: Secondary | ICD-10-CM

## 2019-01-20 LAB — CUP PACEART REMOTE DEVICE CHECK
Battery Remaining Longevity: 24 mo
Brady Statistic RA Percent Paced: 0 %
HighPow Impedance: 61 Ohm
Implantable Lead Implant Date: 20101104
Implantable Lead Implant Date: 20101104
Implantable Lead Location: 753859
Implantable Lead Model: 5076
Implantable Pulse Generator Implant Date: 20101104
Lead Channel Impedance Value: 489 Ohm
Lead Channel Pacing Threshold Amplitude: 0.9 V
Lead Channel Pacing Threshold Pulse Width: 0.4 ms
Lead Channel Pacing Threshold Pulse Width: 0.4 ms
Lead Channel Setting Pacing Amplitude: 2 V
Lead Channel Setting Pacing Pulse Width: 0.4 ms
MDC IDC LEAD LOCATION: 753860
MDC IDC LEAD SERIAL: 301087
MDC IDC MSMT BATTERY REMAINING PERCENTAGE: 31 %
MDC IDC MSMT LEADCHNL RV IMPEDANCE VALUE: 537 Ohm
MDC IDC MSMT LEADCHNL RV PACING THRESHOLD AMPLITUDE: 0.6 V
MDC IDC SESS DTM: 20200329192100
MDC IDC SET LEADCHNL RV PACING AMPLITUDE: 2.5 V
MDC IDC SET LEADCHNL RV SENSING SENSITIVITY: 0.4 mV
MDC IDC STAT BRADY RV PERCENT PACED: 0 %
Pulse Gen Serial Number: 151503

## 2019-01-21 ENCOUNTER — Other Ambulatory Visit: Payer: Self-pay

## 2019-01-27 NOTE — Progress Notes (Signed)
Remote ICD transmission.   

## 2019-04-20 ENCOUNTER — Ambulatory Visit (INDEPENDENT_AMBULATORY_CARE_PROVIDER_SITE_OTHER): Payer: Medicare HMO | Admitting: *Deleted

## 2019-04-20 DIAGNOSIS — I428 Other cardiomyopathies: Secondary | ICD-10-CM

## 2019-04-20 DIAGNOSIS — I429 Cardiomyopathy, unspecified: Secondary | ICD-10-CM

## 2019-04-21 LAB — CUP PACEART REMOTE DEVICE CHECK
Battery Remaining Longevity: 18 mo
Battery Remaining Percentage: 20 %
Brady Statistic RA Percent Paced: 0 %
Brady Statistic RV Percent Paced: 0 %
Date Time Interrogation Session: 20200629091700
HighPow Impedance: 61 Ohm
Implantable Lead Implant Date: 20101104
Implantable Lead Implant Date: 20101104
Implantable Lead Location: 753859
Implantable Lead Location: 753860
Implantable Lead Model: 158
Implantable Lead Model: 5076
Implantable Lead Serial Number: 301087
Implantable Pulse Generator Implant Date: 20101104
Lead Channel Impedance Value: 503 Ohm
Lead Channel Impedance Value: 517 Ohm
Lead Channel Pacing Threshold Amplitude: 0.6 V
Lead Channel Pacing Threshold Amplitude: 0.9 V
Lead Channel Pacing Threshold Pulse Width: 0.4 ms
Lead Channel Pacing Threshold Pulse Width: 0.4 ms
Lead Channel Setting Pacing Amplitude: 2 V
Lead Channel Setting Pacing Amplitude: 2.5 V
Lead Channel Setting Pacing Pulse Width: 0.4 ms
Lead Channel Setting Sensing Sensitivity: 0.4 mV
Pulse Gen Serial Number: 151503

## 2019-04-29 NOTE — Progress Notes (Signed)
Remote ICD transmission.   

## 2019-05-06 ENCOUNTER — Telehealth: Payer: Self-pay

## 2019-05-06 NOTE — Telephone Encounter (Signed)
_tried calling pt to [facility] due to gaps in care and he needs to be seen with [place]

## 2019-07-21 ENCOUNTER — Ambulatory Visit (INDEPENDENT_AMBULATORY_CARE_PROVIDER_SITE_OTHER): Payer: Medicare HMO | Admitting: *Deleted

## 2019-07-21 DIAGNOSIS — I4891 Unspecified atrial fibrillation: Secondary | ICD-10-CM

## 2019-07-21 DIAGNOSIS — I428 Other cardiomyopathies: Secondary | ICD-10-CM

## 2019-07-21 DIAGNOSIS — I429 Cardiomyopathy, unspecified: Secondary | ICD-10-CM

## 2019-07-22 LAB — CUP PACEART REMOTE DEVICE CHECK
Battery Remaining Longevity: 11 mo
Battery Remaining Percentage: 13 %
Brady Statistic RA Percent Paced: 0 %
Brady Statistic RV Percent Paced: 0 %
Date Time Interrogation Session: 20200929085100
HighPow Impedance: 56 Ohm
Implantable Lead Implant Date: 20101104
Implantable Lead Implant Date: 20101104
Implantable Lead Location: 753859
Implantable Lead Location: 753860
Implantable Lead Model: 158
Implantable Lead Model: 5076
Implantable Lead Serial Number: 301087
Implantable Pulse Generator Implant Date: 20101104
Lead Channel Impedance Value: 493 Ohm
Lead Channel Impedance Value: 547 Ohm
Lead Channel Pacing Threshold Amplitude: 0.6 V
Lead Channel Pacing Threshold Amplitude: 0.9 V
Lead Channel Pacing Threshold Pulse Width: 0.4 ms
Lead Channel Pacing Threshold Pulse Width: 0.4 ms
Lead Channel Setting Pacing Amplitude: 2 V
Lead Channel Setting Pacing Amplitude: 2.5 V
Lead Channel Setting Pacing Pulse Width: 0.4 ms
Lead Channel Setting Sensing Sensitivity: 0.4 mV
Pulse Gen Serial Number: 151503

## 2019-07-31 NOTE — Progress Notes (Signed)
Remote ICD transmission.   

## 2019-10-20 ENCOUNTER — Ambulatory Visit (INDEPENDENT_AMBULATORY_CARE_PROVIDER_SITE_OTHER): Payer: Medicare HMO | Admitting: *Deleted

## 2019-10-20 DIAGNOSIS — I428 Other cardiomyopathies: Secondary | ICD-10-CM | POA: Diagnosis not present

## 2019-10-20 DIAGNOSIS — I429 Cardiomyopathy, unspecified: Secondary | ICD-10-CM

## 2019-10-20 LAB — CUP PACEART REMOTE DEVICE CHECK
Battery Remaining Longevity: 4 mo
Battery Remaining Percentage: 4 %
Brady Statistic RA Percent Paced: 0 %
Brady Statistic RV Percent Paced: 0 %
Date Time Interrogation Session: 20201229045200
HighPow Impedance: 58 Ohm
Implantable Lead Implant Date: 20101104
Implantable Lead Implant Date: 20101104
Implantable Lead Location: 753859
Implantable Lead Location: 753860
Implantable Lead Model: 158
Implantable Lead Model: 5076
Implantable Lead Serial Number: 301087
Implantable Pulse Generator Implant Date: 20101104
Lead Channel Impedance Value: 502 Ohm
Lead Channel Impedance Value: 508 Ohm
Lead Channel Pacing Threshold Amplitude: 0.6 V
Lead Channel Pacing Threshold Amplitude: 0.9 V
Lead Channel Pacing Threshold Pulse Width: 0.4 ms
Lead Channel Pacing Threshold Pulse Width: 0.4 ms
Lead Channel Setting Pacing Amplitude: 2 V
Lead Channel Setting Pacing Amplitude: 2.5 V
Lead Channel Setting Pacing Pulse Width: 0.4 ms
Lead Channel Setting Sensing Sensitivity: 0.4 mV
Pulse Gen Serial Number: 151503

## 2019-11-03 ENCOUNTER — Telehealth: Payer: Self-pay | Admitting: Internal Medicine

## 2019-11-03 NOTE — Telephone Encounter (Signed)
Latitude alert for ICD at RRT.  Will route to Dr Clayburn Pert.  Pt likely needs office visit prior to scheduling gen change.  Chanetta Marshall, NP 11/03/2019 11:02 AM

## 2019-11-03 NOTE — Telephone Encounter (Signed)
  Agree w Luetta Nutting that pt needs OV prior to Replacement

## 2019-11-20 NOTE — Progress Notes (Signed)
Cardiology Office Note    Date:  11/24/2019   ID:  [name] [name], DOB Mar 31, 1961, MRN [id number]  PCP:  Lavera Guise, MD  Cardiologist:  Kathlyn Sacramento, MD  Electrophysiologist:  Virl Axe, MD   Chief Complaint: Follow-up  History of Present Illness:   [name] [name] is a 59 y.o. male with history of CAD s/p CABG, HFrEF due to ICM s/p ICD for primary prevention, mitral valve repair in 2010, prior apical thrombus s/p Coumadin therapy, and HLD who presents for follow up of his CAD and ICM.  Patient underwent 2-vessel CABG (LIMA-LAD, RIMA-RCA) and mitral valve repair in 2010. Prior EF noted to be 20% with apical thrombus. He underwent ICD as part of MADIT-RIT trial for primary prevention. Echo from 01/2016 showed an improved EF of 40-45%, anterior and anteroseptal hypokinesis, mild AI, intact mitral valve repair, no significant pulmonary hypertension, no evidence of apical thrombus. His Coumadin was stopped at that time.  Carotid artery ultrasound from 04/2017 demonstrated no significant carotid artery stenosis along the right side with 50 to 60% stenosis along the left internal carotid artery.  I last saw him in 05/2018 for routine follow-up at which time he was doing well.  He continued to smoke a half pack of cigarettes daily.  Follow-up carotid artery ultrasound in 05/2018 showed improved bilateral ICA stenosis of 1 to 39% along with antegrade flow of the bilateral vertebral arteries, and normal flow along the bilateral subclavian arteries.  He was seen by EP later in 05/2018 and was doing well.  Most recent device interrogation from 09/2019 indicated his battery was approaching RRT.  He comes in doing well and denies any chest pain, shortness of breath, palpitations, dizziness, presyncope, syncope.  No lower extremity swelling or orthopnea.  He has not been tolerating Crestor secondary to myalgias and arthralgias, particularly of the bilateral upper digits.  He notes his symptoms improve with  discontinuation of Crestor following 2 to 3 days.  He tolerated Lipitor without issue.  He does also feel like he is tolerating Zetia without issue.  He feels like Crestor has led to some weight gain with his weight being up 9 pounds today when compared to his visit in 05/2018.  Since he was last seen he did have 1 mechanical fall off a roof of approximately 10 to 12 feet in which his leg got caught up on a branch.  He did not hit his head or suffer LOC.  Originally, he was scheduled to see EP in [place] on 2/3 given his device is at RRT.  EP will see him today to schedule generator replacement.   Labs independently reviewed: 05/2018 - direct LDL 133, TC 223, TG 366, HDL 41, BUN 18, serum creatinine 0.97, potassium 4.7, albumin 4.4, AST/ALT normal  Past Medical History:  Diagnosis Date  . Atrial fibrillation ([place])    a. s/p CABG  . Cardiac defibrillator in situ    MADIT RIT trial  . Coronary artery disease    a. s/p 2-V CABG (LIMA-LAD, RIMA-RCA) 2010  . Hyperlipidemia   . Ischemic cardiomyopathy    a. prior EF 20%; b. TTE 4/17: EF 40-45%, ant/antoseptal HK, mild AI, intact mitral valve repair, no sig pulm htn, no evidence of apical thrombus   . Mitral valve disease    a. s/p repair 2010 at time of bypass  . Post-infarction apical thrombus ([place])    a. s/p Coumadin    Past Surgical History:  Procedure Laterality Date  .  CARDIAC DEFIBRILLATOR PLACEMENT     [employer]  . COLONOSCOPY WITH PROPOFOL N/A 07/31/2016   Procedure: COLONOSCOPY WITH PROPOFOL;  Surgeon: Lollie Sails, MD;  Location: [facility] ENDOSCOPY;  Service: Endoscopy;  Laterality: N/A;  . CORONARY ARTERY BYPASS GRAFT  2010   LIMA to LAD, RIMA to RCA and mitral valve repair  . KNEE SURGERY    . MITRAL VALVE REPAIR  2010    Current Medications: Current Meds  Medication Sig  . aspirin EC 81 MG tablet Take 1 tablet (81 mg total) by mouth daily.  . carvedilol (COREG) 25 MG tablet Take 1 tablet (25 mg total) by mouth  2 (two) times daily with a meal.  . enalapril (VASOTEC) 10 MG tablet Take 1 tablet (10 mg) once daily at night  . ezetimibe (ZETIA) 10 MG tablet Take 1 tablet (10 mg total) by mouth daily.  . nitroGLYCERIN (NITROSTAT) 0.4 MG SL tablet Place 0.4 mg under the tongue every 5 (five) minutes as needed.    . Omega-3 Fatty Acids (FISH OIL) 1200 MG CAPS Take 2,400 mg by mouth daily.   . rosuvastatin (CRESTOR) 40 MG tablet Take 1 tablet (40 mg total) by mouth daily.  Marland Kitchen spironolactone (ALDACTONE) 25 MG tablet TAKE 1 TABLET (25 MG TOTAL) BY MOUTH DAILY.    Allergies:   Patient has no known allergies.   Social History   Socioeconomic History  . Marital status: Married    Spouse name: Not on file  . Number of children: Not on file  . Years of education: Not on file  . Highest education level: Not on file  Occupational History  . Not on file  Tobacco Use  . Smoking status: Current Every Day Smoker    Packs/day: 1.00    Years: 37.00    Pack years: 37.00    Types: Cigarettes    Last attempt to quit: 05/02/2009    Years since quitting: 10.5  . Smokeless tobacco: Never Used  Substance and Sexual Activity  . Alcohol use: No    Comment: ocassionally  . Drug use: No  . Sexual activity: Not on file  Other Topics Concern  . Not on file  Social History Narrative  . Not on file   Social Determinants of Health   Financial Resource Strain:   . Difficulty of Paying Living Expenses: Not on file  Food Insecurity:   . Worried About Charity fundraiser in the Last Year: Not on file  . Ran Out of Food in the Last Year: Not on file  Transportation Needs:   . Lack of Transportation (Medical): Not on file  . Lack of Transportation (Non-Medical): Not on file  Physical Activity:   . Days of Exercise per Week: Not on file  . Minutes of Exercise per Session: Not on file  Stress:   . Feeling of Stress : Not on file  Social Connections:   . Frequency of Communication with Friends and Family: Not on file   . Frequency of Social Gatherings with Friends and Family: Not on file  . Attends Religious Services: Not on file  . Active Member of Clubs or Organizations: Not on file  . Attends Archivist Meetings: Not on file  . Marital Status: Not on file     Family History:  The patient's family history includes Heart attack in his mother; Heart attack (age of onset: 78) in his father; Heart failure in his mother; Hyperlipidemia in an other family member.  ROS:   Review of Systems  Constitutional: Negative for chills, diaphoresis, fever, malaise/fatigue and weight loss.  HENT: Negative for congestion.   Eyes: Negative for discharge and redness.  Respiratory: Negative for cough, sputum production, shortness of breath and wheezing.   Cardiovascular: Negative for chest pain, palpitations, orthopnea, claudication, leg swelling and PND.  Gastrointestinal: Negative for abdominal pain, blood in stool, heartburn, melena, nausea and vomiting.  Musculoskeletal: Positive for joint pain and myalgias. Negative for falls.  Skin: Negative for rash.  Neurological: Negative for dizziness, tingling, tremors, sensory change, speech change, focal weakness, loss of consciousness and weakness.  Endo/Heme/Allergies: Does not bruise/bleed easily.  Psychiatric/Behavioral: Negative for substance abuse. The patient is not nervous/anxious.   All other systems reviewed and are negative.    EKGs/Labs/Other Studies Reviewed:    Studies reviewed were summarized above. The additional studies were reviewed today: As above.  EKG:  EKG is ordered today.  The EKG ordered today demonstrates NSR, 72 bpm, possible LAE, inferior Q waves, possible prior anteroseptal infarct, no specific anterolateral ST-T changes, without significant change compared to prior  Recent Labs: No results found for requested labs within last 8760 hours.  Recent Lipid Panel    Component Value Date/Time   CHOL 223 (H) 05/27/2018 1538   TRIG  366 (H) 05/27/2018 1538   HDL 41 05/27/2018 1538   CHOLHDL 5.4 (H) 05/27/2018 1538   CHOLHDL 3.4 01/15/2017 0737   VLDL 29 01/15/2017 0737   LDLCALC 109 (H) 05/27/2018 1538   LDLDIRECT 133 (H) 05/27/2018 1538    PHYSICAL EXAM:    VS:  BP 120/80 (BP Location: Left Arm, Patient Position: Sitting, Cuff Size: Normal)   Pulse 72   Ht 5\' 9"  (1.753 m)   Wt 203 lb (92.1 kg)   SpO2 97%   BMI 29.98 kg/m   BMI: Body mass index is 29.98 kg/m.  Physical Exam  Constitutional: He is oriented to person, place, and time. He appears well-developed and well-nourished.  HENT:  Head: Normocephalic and atraumatic.  Eyes: Right eye exhibits no discharge. Left eye exhibits no discharge.  Neck: No JVD present.  Cardiovascular: Normal rate, regular rhythm, S1 normal, S2 normal and normal heart sounds. Exam reveals no distant heart sounds, no friction rub, no midsystolic click and no opening snap.  No murmur heard. Pulses:      Posterior tibial pulses are 2+ on the right side and 2+ on the left side.  Pulmonary/Chest: Effort normal and breath sounds normal. No respiratory distress. He has no decreased breath sounds. He has no wheezes. He has no rales. He exhibits no tenderness.  Abdominal: Soft. He exhibits no distension. There is no abdominal tenderness.  Musculoskeletal:        General: No edema.     Cervical back: Normal range of motion.  Neurological: He is alert and oriented to person, place, and time.  Skin: Skin is warm and dry. No cyanosis. Nails show no clubbing.  Psychiatric: He has a normal mood and affect. His speech is normal and behavior is normal. Judgment and thought content normal.    Wt Readings from Last 3 Encounters:  11/24/19 203 lb (92.1 kg)  06/10/18 194 lb (88 kg)  05/27/18 191 lb 4 oz (86.8 kg)     ASSESSMENT & PLAN:   1. CAD status post bypass without angina: He is doing well without any symptoms concerning for angina.  Continue secondary prevention with aspirin,  carvedilol, enalapril, Zetia and as needed nitro.  Transition from Crestor to Lipitor as outlined below secondary to intolerance.  He has not needed any as needed nitro.  Risk factor modification including smoking cessation is recommended.  No plans for ischemic evaluation at this time.  2. HFrEF secondary to ICM: He is euvolemic and well compensated.  His weight is up 9 pounds when compared to his last office visit in 05/2018 which he attributes to rosuvastatin.  Continue current doses of carvedilol, enalapril, and spironolactone.  Check labs.  3. Status post ICD: Patient's device is RRT with an estimated 4 months longevity dated from most recent device interrogation on 10/20/2019.  Case was discussed by Dr. Caryl Comes who will see the patient while in the office today for scheduling of generator replacement.  4. HTN: Blood pressure is well controlled today.  Continue current doses of carvedilol, enalapril, and spironolactone.  Low-sodium diet.  5. HLD: Direct LDL of 133 from 05/2018.  In this setting, he was transitioned to Crestor 40 mg daily and Zetia 10 mg daily was added at that time.  He is intolerant to Crestor secondary to myalgias and arthralgias, particularly of the bilateral upper digits which improve after discontinuing the medication.  He does report tolerance of Lipitor and Zetia.  In this setting, we will discontinue Crestor and placed him back on atorvastatin 80 mg daily with continuation of Zetia 10 mg daily.  Goal LDL less than 70.  Check FLP and CMP.  If his LDL remains above goal following transition to atorvastatin we will refer him to the lipid clinic for consideration of PCSK9 inhibitor.  6. Carotid artery disease: Most recent carotid artery ultrasound from 05/2018 showed improved disease with 1 to 39% bilateral internal carotid artery stenosis.  Schedule carotid artery ultrasound for 05/2020.  Continue aspirin, rosuvastatin, and ezetimibe as above.  Risk factor modification.  7. Tobacco  use: Complete cessation is recommended.  Disposition: F/u with Dr. Fletcher Anon or an APP in 6 months and EP as directed.   Medication Adjustments/Labs and Tests Ordered: Current medicines are reviewed at length with the patient today.  Concerns regarding medicines are outlined above. Medication changes, Labs and Tests ordered today are summarized above and listed in the Patient Instructions accessible in Encounters.   Signed, Christell Faith, PA-C 11/24/2019 8:17 AM     [facility] [address] [place] [address] [place], [place] [address] [phone]

## 2019-11-24 ENCOUNTER — Ambulatory Visit (INDEPENDENT_AMBULATORY_CARE_PROVIDER_SITE_OTHER): Payer: Medicare HMO | Admitting: Physician Assistant

## 2019-11-24 ENCOUNTER — Encounter: Payer: Self-pay | Admitting: Physician Assistant

## 2019-11-24 ENCOUNTER — Encounter: Payer: Self-pay | Admitting: *Deleted

## 2019-11-24 ENCOUNTER — Other Ambulatory Visit: Payer: Self-pay

## 2019-11-24 ENCOUNTER — Ambulatory Visit (INDEPENDENT_AMBULATORY_CARE_PROVIDER_SITE_OTHER): Payer: Medicare HMO | Admitting: Internal Medicine

## 2019-11-24 ENCOUNTER — Encounter: Payer: Self-pay | Admitting: Internal Medicine

## 2019-11-24 VITALS — BP 120/80 | HR 72 | Ht 69.0 in | Wt 203.0 lb

## 2019-11-24 DIAGNOSIS — E785 Hyperlipidemia, unspecified: Secondary | ICD-10-CM

## 2019-11-24 DIAGNOSIS — I2510 Atherosclerotic heart disease of native coronary artery without angina pectoris: Secondary | ICD-10-CM | POA: Diagnosis not present

## 2019-11-24 DIAGNOSIS — I10 Essential (primary) hypertension: Secondary | ICD-10-CM | POA: Diagnosis not present

## 2019-11-24 DIAGNOSIS — I255 Ischemic cardiomyopathy: Secondary | ICD-10-CM

## 2019-11-24 DIAGNOSIS — I5022 Chronic systolic (congestive) heart failure: Secondary | ICD-10-CM

## 2019-11-24 DIAGNOSIS — Z95810 Presence of automatic (implantable) cardiac defibrillator: Secondary | ICD-10-CM | POA: Diagnosis not present

## 2019-11-24 DIAGNOSIS — I6522 Occlusion and stenosis of left carotid artery: Secondary | ICD-10-CM | POA: Diagnosis not present

## 2019-11-24 DIAGNOSIS — Z951 Presence of aortocoronary bypass graft: Secondary | ICD-10-CM | POA: Diagnosis not present

## 2019-11-24 MED ORDER — NITROGLYCERIN 0.4 MG SL SUBL: 0.4000 mg | SUBLINGUAL_TABLET | SUBLINGUAL | 3 refills | Status: DC | PRN

## 2019-11-24 MED ORDER — ASPIRIN EC 81 MG PO TBEC: 81.0000 mg | DELAYED_RELEASE_TABLET | Freq: Every day | ORAL | 3 refills | Status: AC

## 2019-11-24 MED ORDER — SPIRONOLACTONE 25 MG PO TABS: ORAL_TABLET | ORAL | 3 refills | Status: DC

## 2019-11-24 MED ORDER — ATORVASTATIN CALCIUM 80 MG PO TABS: 80.0000 mg | ORAL_TABLET | Freq: Every day | ORAL | 3 refills | Status: DC

## 2019-11-24 MED ORDER — EZETIMIBE 10 MG PO TABS: 10.0000 mg | ORAL_TABLET | Freq: Every day | ORAL | 3 refills | Status: DC

## 2019-11-24 MED ORDER — ENALAPRIL MALEATE 10 MG PO TABS: ORAL_TABLET | ORAL | 3 refills | Status: DC

## 2019-11-24 MED ORDER — CARVEDILOL 25 MG PO TABS: 25.0000 mg | ORAL_TABLET | Freq: Two times a day (BID) | ORAL | 3 refills | Status: DC

## 2019-11-24 NOTE — Progress Notes (Signed)
Patient Care Team: Lavera Guise, MD as PCP - General (Internal Medicine) Wellington Hampshire, MD as PCP - Cardiology (Cardiology) Deboraha Sprang, MD as PCP - Electrophysiology (Cardiology) Wellington Hampshire, MD as Consulting Physician (Cardiology)   HPI  [name] [name] is a 59 y.o. male Seen in followup for ICD implanted originally as part of MADIT-RIT for primary prevention with ischemic cardiomyopathy with previous ejection fraction of 20%   He has  coronary artery disease s/p CABG and mitral valve repair in 2010; 4/17  Echo 40-45%, previous apical thrombus  and ICD placement for primary prevention.   Tolerating meds  Date Cr K LDL  3/18  0.77 4.2    8/19 0.97 4.7 109   The patient denies chest pain, shortness of breath, nocturnal dyspnea, orthopnea or peripheral edema.  There have been no palpitations, lightheadedness or syncope.       Past Medical History:  Diagnosis Date  . Atrial fibrillation ([place])    a. s/p CABG  . Cardiac defibrillator in situ    MADIT RIT trial  . Coronary artery disease    a. s/p 2-V CABG (LIMA-LAD, RIMA-RCA) 2010  . Hyperlipidemia   . Ischemic cardiomyopathy    a. prior EF 20%; b. TTE 4/17: EF 40-45%, ant/antoseptal HK, mild AI, intact mitral valve repair, no sig pulm htn, no evidence of apical thrombus   . Mitral valve disease    a. s/p repair 2010 at time of bypass  . Post-infarction apical thrombus ([place])    a. s/p Coumadin    Past Surgical History:  Procedure Laterality Date  . CARDIAC DEFIBRILLATOR PLACEMENT     [employer]  . COLONOSCOPY WITH PROPOFOL N/A 07/31/2016   Procedure: COLONOSCOPY WITH PROPOFOL;  Surgeon: Lollie Sails, MD;  Location: [facility] ENDOSCOPY;  Service: Endoscopy;  Laterality: N/A;  . CORONARY ARTERY BYPASS GRAFT  2010   LIMA to LAD, RIMA to RCA and mitral valve repair  . KNEE SURGERY    . MITRAL VALVE REPAIR  2010    Current Outpatient Medications  Medication Sig Dispense Refill  . aspirin EC  81 MG tablet Take 1 tablet (81 mg total) by mouth daily. 90 tablet 3  . atorvastatin (LIPITOR) 80 MG tablet Take 1 tablet (80 mg total) by mouth daily. 90 tablet 3  . carvedilol (COREG) 25 MG tablet Take 1 tablet (25 mg total) by mouth 2 (two) times daily with a meal. 180 tablet 3  . enalapril (VASOTEC) 10 MG tablet Take 1 tablet (10 mg) once daily at night 90 tablet 3  . ezetimibe (ZETIA) 10 MG tablet Take 1 tablet (10 mg total) by mouth daily. 90 tablet 3  . nitroGLYCERIN (NITROSTAT) 0.4 MG SL tablet Place 1 tablet (0.4 mg total) under the tongue every 5 (five) minutes as needed. 25 tablet 3  . Omega-3 Fatty Acids (FISH OIL) 1200 MG CAPS Take 2,400 mg by mouth daily.     Marland Kitchen spironolactone (ALDACTONE) 25 MG tablet TAKE 1 TABLET (25 MG TOTAL) BY MOUTH DAILY. 90 tablet 3   No current facility-administered medications for this visit.    No Known Allergies    Review of Systems negative except from HPI and PMH  Physical Exam BP 120/80   Pulse 72   Ht 5\' 9"  (1.753 m)   Wt 203 lb (92.1 kg)   SpO2 97%   BMI 29.98 kg/m  Well developed and well nourished in no acute distress HENT  normal Neck supple with JVP-flat Clear Device pocket well healed; without hematoma or erythema.  There is no tethering  Regular rate and rhythm, no   murmur Abd-soft with active BS No Clubbing cyanosis  edema Skin-warm and dry A & Oriented  Grossly normal sensory and motor function       Assessment and Plan:   Ischemic cardiomyopathy   Implantable defibrillator-[employer]  @ ERI  Without symptoms of ischemia  Euvolemic continue current meds  Device @ ERI   We have reviewed the benefits and risks of generator replacement in the setting of improved LV function and not    These include but are not limited to lead fracture and infection.  The patient understands, agrees and is willing to proceed.      Current medicines are reviewed at length with the patient today .  The patient does not   have concerns regarding medicines.

## 2019-11-24 NOTE — Patient Instructions (Signed)
Medication Instructions:  - Your physician recommends that you continue on your current medications as directed. Please refer to the Current Medication list given to you today.  *If you need a refill on your cardiac medications before your next appointment, please call your pharmacy*  Lab Work: - Pre-procedure COVID swab: - Thursday 12/03/19 (12:30 pm- 2:30 pm) - [facility], drive up test only   If you have labs (blood work) drawn today and your tests are completely normal, you will receive your results only by: Marland Kitchen MyChart Message (if you have MyChart) OR . A paper copy in the mail If you have any lab test that is abnormal or we need to change your treatment, we will call you to review the results.  Testing/Procedures: - Your physician has recommended that you have a defibrillator generator (battery) change out  Follow-Up: At [facility], you and your health needs are our priority.  As part of our continuing mission to provide you with exceptional heart care, we have created designated Provider Care Teams.  These Care Teams include your primary Cardiologist (physician) and Advanced Practice Providers (APPs -  Physician Assistants and Nurse Practitioners) who all work together to provide you with the care you need, when you need it.  Your next appointment:   - 10- 14 days (from 12/07/19) with the [facility] in [place] for a wound check - 91 days ( from 12/07/19) with Dr. Caryl Comes   The format for your next appointment:   In Person  Provider:   as above  Other Instructions n/a

## 2019-11-24 NOTE — H&P (View-Only) (Signed)
Patient Care Team: Lavera Guise, MD as PCP - General (Internal Medicine) Wellington Hampshire, MD as PCP - Cardiology (Cardiology) Deboraha Sprang, MD as PCP - Electrophysiology (Cardiology) Wellington Hampshire, MD as Consulting Physician (Cardiology)   HPI  [name] [name] is a 59 y.o. male Seen in followup for ICD implanted originally as part of MADIT-RIT for primary prevention with ischemic cardiomyopathy with previous ejection fraction of 20%   He has  coronary artery disease s/p CABG and mitral valve repair in 2010; 4/17  Echo 40-45%, previous apical thrombus  and ICD placement for primary prevention.   Tolerating meds  Date Cr K LDL  3/18  0.77 4.2    8/19 0.97 4.7 109   The patient denies chest pain, shortness of breath, nocturnal dyspnea, orthopnea or peripheral edema.  There have been no palpitations, lightheadedness or syncope.       Past Medical History:  Diagnosis Date  . Atrial fibrillation ([place])    a. s/p CABG  . Cardiac defibrillator in situ    MADIT RIT trial  . Coronary artery disease    a. s/p 2-V CABG (LIMA-LAD, RIMA-RCA) 2010  . Hyperlipidemia   . Ischemic cardiomyopathy    a. prior EF 20%; b. TTE 4/17: EF 40-45%, ant/antoseptal HK, mild AI, intact mitral valve repair, no sig pulm htn, no evidence of apical thrombus   . Mitral valve disease    a. s/p repair 2010 at time of bypass  . Post-infarction apical thrombus ([place])    a. s/p Coumadin    Past Surgical History:  Procedure Laterality Date  . CARDIAC DEFIBRILLATOR PLACEMENT     [employer]  . COLONOSCOPY WITH PROPOFOL N/A 07/31/2016   Procedure: COLONOSCOPY WITH PROPOFOL;  Surgeon: Lollie Sails, MD;  Location: [facility] ENDOSCOPY;  Service: Endoscopy;  Laterality: N/A;  . CORONARY ARTERY BYPASS GRAFT  2010   LIMA to LAD, RIMA to RCA and mitral valve repair  . KNEE SURGERY    . MITRAL VALVE REPAIR  2010    Current Outpatient Medications  Medication Sig Dispense Refill  . aspirin EC  81 MG tablet Take 1 tablet (81 mg total) by mouth daily. 90 tablet 3  . atorvastatin (LIPITOR) 80 MG tablet Take 1 tablet (80 mg total) by mouth daily. 90 tablet 3  . carvedilol (COREG) 25 MG tablet Take 1 tablet (25 mg total) by mouth 2 (two) times daily with a meal. 180 tablet 3  . enalapril (VASOTEC) 10 MG tablet Take 1 tablet (10 mg) once daily at night 90 tablet 3  . ezetimibe (ZETIA) 10 MG tablet Take 1 tablet (10 mg total) by mouth daily. 90 tablet 3  . nitroGLYCERIN (NITROSTAT) 0.4 MG SL tablet Place 1 tablet (0.4 mg total) under the tongue every 5 (five) minutes as needed. 25 tablet 3  . Omega-3 Fatty Acids (FISH OIL) 1200 MG CAPS Take 2,400 mg by mouth daily.     Marland Kitchen spironolactone (ALDACTONE) 25 MG tablet TAKE 1 TABLET (25 MG TOTAL) BY MOUTH DAILY. 90 tablet 3   No current facility-administered medications for this visit.    No Known Allergies    Review of Systems negative except from HPI and PMH  Physical Exam BP 120/80   Pulse 72   Ht 5\' 9"  (1.753 m)   Wt 203 lb (92.1 kg)   SpO2 97%   BMI 29.98 kg/m  Well developed and well nourished in no acute distress HENT  normal Neck supple with JVP-flat Clear Device pocket well healed; without hematoma or erythema.  There is no tethering  Regular rate and rhythm, no   murmur Abd-soft with active BS No Clubbing cyanosis  edema Skin-warm and dry A & Oriented  Grossly normal sensory and motor function       Assessment and Plan:   Ischemic cardiomyopathy   Implantable defibrillator-[employer]  @ ERI  Without symptoms of ischemia  Euvolemic continue current meds  Device @ ERI   We have reviewed the benefits and risks of generator replacement in the setting of improved LV function and not    These include but are not limited to lead fracture and infection.  The patient understands, agrees and is willing to proceed.      Current medicines are reviewed at length with the patient today .  The patient does not   have concerns regarding medicines.

## 2019-11-24 NOTE — Patient Instructions (Signed)
Medication Instructions:  1- STOP Crestor  2- START Lipitor Take 1 tablet (80 mg total) by mouth daily *If you need a refill on your cardiac medications before your next appointment, please call your pharmacy*  Lab Work: Your physician recommends that you have lab work today(Lipid, CMET, CBC)  If you have labs (blood work) drawn today and your tests are completely normal, you will receive your results only by: Marland Kitchen MyChart Message (if you have MyChart) OR . A paper copy in the mail If you have any lab test that is abnormal or we need to change your treatment, we will call you to review the results.  Testing/Procedures: 1- Your physician has requested that you have a carotid duplex. This test is an ultrasound of the carotid arteries in your neck. It looks at blood flow through these arteries that supply the brain with blood. Allow one hour for this exam. There are no restrictions or special instructions.   Please return to [facility] on _________(august 2021)_____ at _______________ AM/PM for a carotid ultrasound.   Follow-Up: At [facility], you and your health needs are our priority.  As part of our continuing mission to provide you with exceptional heart care, we have created designated Provider Care Teams.  These Care Teams include your primary Cardiologist (physician) and Advanced Practice Providers (APPs -  Physician Assistants and Nurse Practitioners) who all work together to provide you with the care you need, when you need it.  Your next appointment:   6 month(s)  The format for your next appointment:   In Person  Provider:    You may see Kathlyn Sacramento, MD or Christell Faith, PA-C.

## 2019-11-25 ENCOUNTER — Encounter: Payer: Medicare HMO | Admitting: Internal Medicine

## 2019-11-25 ENCOUNTER — Telehealth: Payer: Self-pay

## 2019-11-25 DIAGNOSIS — I2510 Atherosclerotic heart disease of native coronary artery without angina pectoris: Secondary | ICD-10-CM

## 2019-11-25 LAB — COMPREHENSIVE METABOLIC PANEL
ALT: 19 IU/L (ref 0–44)
AST: 23 IU/L (ref 0–40)
Albumin/Globulin Ratio: 1.8 (ref 1.2–2.2)
Albumin: 4.4 g/dL (ref 3.8–4.8)
Alkaline Phosphatase: 86 IU/L (ref 39–117)
BUN/Creatinine Ratio: 12 (ref 10–24)
BUN: 11 mg/dL (ref 8–27)
Bilirubin Total: 0.5 mg/dL (ref 0.0–1.2)
CO2: 19 mmol/L — ABNORMAL LOW (ref 20–29)
Calcium: 9.5 mg/dL (ref 8.6–10.2)
Chloride: 106 mmol/L (ref 96–106)
Creatinine, Ser: 0.94 mg/dL (ref 0.76–1.27)
GFR calc Af Amer: 101 mL/min/{1.73_m2} (ref >59)
GFR calc non Af Amer: 87 mL/min/{1.73_m2} (ref >59)
Globulin, Total: 2.5 g/dL (ref 1.5–4.5)
Glucose: 108 mg/dL — ABNORMAL HIGH (ref 65–99)
Potassium: 4.9 mmol/L (ref 3.5–5.2)
Sodium: 139 mmol/L (ref 134–144)
Total Protein: 6.9 g/dL (ref 6.0–8.5)

## 2019-11-25 LAB — CBC
Hematocrit: 44.1 % (ref 37.5–51.0)
Hemoglobin: 15.4 g/dL (ref 13.0–17.7)
MCH: 30.9 pg (ref 26.6–33.0)
MCHC: 34.9 g/dL (ref 31.5–35.7)
MCV: 89 fL (ref 79–97)
Platelets: 304 10*3/uL (ref 150–450)
RBC: 4.98 x10E6/uL (ref 4.14–5.80)
RDW: 13 % (ref 11.6–15.4)
WBC: 8 10*3/uL (ref 3.4–10.8)

## 2019-11-25 LAB — LIPID PANEL
Chol/HDL Ratio: 4.9 ratio (ref 0.0–5.0)
Cholesterol, Total: 201 mg/dL — ABNORMAL HIGH (ref 100–199)
HDL: 41 mg/dL (ref >39)
LDL Chol Calc (NIH): 135 mg/dL — ABNORMAL HIGH (ref 0–99)
Triglycerides: 139 mg/dL (ref 0–149)
VLDL Cholesterol Cal: 25 mg/dL (ref 5–40)

## 2019-11-25 NOTE — Telephone Encounter (Signed)
Call to patient to review results. Pt verbalized understanding and is cooperative with POC.   [facility] or around 3/31. Orders placed.   Advised pt to call for any further questions or concerns.

## 2019-11-25 NOTE — Telephone Encounter (Signed)
-----   Message from Rise Mu, PA-C sent at 11/25/2019  7:11 AM EST ----- Random glucose okay. Kidney and liver function normal. Potassium at goal. Blood count normal. LDL above goal.  Recommendations: He was transferred from Crestor back to Lipitor at his visit on 2/2 secondary to intolerance with Crestor.  He should continue atorvastatin 80 mg daily along with continuation of Zetia 10 mg daily.  Recheck fasting lipid and liver function in 8 weeks.  If LDL remains above goal that time we will plan to refer him to the lipid clinic for consideration of PCSK9 inhibitor.

## 2019-12-03 ENCOUNTER — Other Ambulatory Visit
Admission: RE | Admit: 2019-12-03 | Discharge: 2019-12-03 | Disposition: A | Discharge status: Home / Self Care | Payer: Medicare HMO | Source: Ambulatory Visit | Attending: Internal Medicine | Admitting: Internal Medicine

## 2019-12-03 ENCOUNTER — Other Ambulatory Visit: Payer: Self-pay

## 2019-12-03 DIAGNOSIS — Z20822 Contact with and (suspected) exposure to covid-19: Secondary | ICD-10-CM | POA: Insufficient documentation

## 2019-12-03 DIAGNOSIS — Z01812 Encounter for preprocedural laboratory examination: Secondary | ICD-10-CM | POA: Insufficient documentation

## 2019-12-03 LAB — SARS CORONAVIRUS 2 (TAT 6-24 HRS): SARS Coronavirus 2: NEGATIVE

## 2019-12-07 ENCOUNTER — Other Ambulatory Visit: Payer: Self-pay

## 2019-12-07 ENCOUNTER — Ambulatory Visit (HOSPITAL_COMMUNITY)
Admission: RE | Disposition: A | Discharge status: Home / Self Care | Payer: Medicare HMO | Source: Ambulatory Visit | Attending: Internal Medicine

## 2019-12-07 ENCOUNTER — Ambulatory Visit (HOSPITAL_COMMUNITY)
Admission: RE | Admit: 2019-12-07 | Discharge: 2019-12-07 | Disposition: A | Discharge status: Home / Self Care | Payer: Medicare HMO | Source: Ambulatory Visit | Attending: Internal Medicine | Admitting: Internal Medicine

## 2019-12-07 DIAGNOSIS — I252 Old myocardial infarction: Secondary | ICD-10-CM | POA: Insufficient documentation

## 2019-12-07 DIAGNOSIS — I255 Ischemic cardiomyopathy: Secondary | ICD-10-CM | POA: Insufficient documentation

## 2019-12-07 DIAGNOSIS — E785 Hyperlipidemia, unspecified: Secondary | ICD-10-CM | POA: Insufficient documentation

## 2019-12-07 DIAGNOSIS — I4891 Unspecified atrial fibrillation: Secondary | ICD-10-CM | POA: Insufficient documentation

## 2019-12-07 DIAGNOSIS — Z4502 Encounter for adjustment and management of automatic implantable cardiac defibrillator: Secondary | ICD-10-CM | POA: Insufficient documentation

## 2019-12-07 DIAGNOSIS — I2510 Atherosclerotic heart disease of native coronary artery without angina pectoris: Secondary | ICD-10-CM | POA: Insufficient documentation

## 2019-12-07 DIAGNOSIS — Z7982 Long term (current) use of aspirin: Secondary | ICD-10-CM | POA: Diagnosis not present

## 2019-12-07 DIAGNOSIS — Z95810 Presence of automatic (implantable) cardiac defibrillator: Secondary | ICD-10-CM

## 2019-12-07 DIAGNOSIS — Z951 Presence of aortocoronary bypass graft: Secondary | ICD-10-CM | POA: Insufficient documentation

## 2019-12-07 DIAGNOSIS — Z79899 Other long term (current) drug therapy: Secondary | ICD-10-CM | POA: Insufficient documentation

## 2019-12-07 DIAGNOSIS — R001 Bradycardia, unspecified: Secondary | ICD-10-CM | POA: Insufficient documentation

## 2019-12-07 HISTORY — PX: ICD GENERATOR CHANGEOUT: EP1231

## 2019-12-07 SURGERY — ICD GENERATOR CHANGEOUT

## 2019-12-07 MED ORDER — MIDAZOLAM HCL 5 MG/5ML IJ SOLN
INTRAMUSCULAR | Status: AC
  Filled 2019-12-07: qty 5

## 2019-12-07 MED ORDER — ACETAMINOPHEN 325 MG PO TABS: 325.0000 mg | ORAL_TABLET | ORAL | Status: DC | PRN

## 2019-12-07 MED ORDER — ONDANSETRON HCL 4 MG/2ML IJ SOLN: 4.0000 mg | Freq: Four times a day (QID) | INTRAMUSCULAR | Status: DC | PRN

## 2019-12-07 MED ORDER — FENTANYL CITRATE (PF) 100 MCG/2ML IJ SOLN
INTRAMUSCULAR | Status: DC | PRN
  Administered 2019-12-07 (×3): 25 ug via INTRAVENOUS

## 2019-12-07 MED ORDER — SODIUM CHLORIDE 0.9 % IV SOLN
80.0000 mg | INTRAVENOUS | Status: AC
  Administered 2019-12-07: 80 mg

## 2019-12-07 MED ORDER — LIDOCAINE HCL (PF) 1 % IJ SOLN
INTRAMUSCULAR | Status: DC | PRN
  Administered 2019-12-07: 60 mL

## 2019-12-07 MED ORDER — FENTANYL CITRATE (PF) 100 MCG/2ML IJ SOLN
INTRAMUSCULAR | Status: AC
  Filled 2019-12-07: qty 2

## 2019-12-07 MED ORDER — MIDAZOLAM HCL 5 MG/5ML IJ SOLN
INTRAMUSCULAR | Status: DC | PRN
  Administered 2019-12-07: 2 mg via INTRAVENOUS
  Administered 2019-12-07 (×2): 1 mg via INTRAVENOUS

## 2019-12-07 MED ORDER — SODIUM CHLORIDE 0.9 % IV SOLN
INTRAVENOUS | Status: AC
  Filled 2019-12-07: qty 2

## 2019-12-07 MED ORDER — SODIUM CHLORIDE 0.9 % IV SOLN: INTRAVENOUS | Status: DC

## 2019-12-07 MED ORDER — CEFAZOLIN SODIUM-DEXTROSE 2-4 GM/100ML-% IV SOLN
INTRAVENOUS | Status: AC
  Filled 2019-12-07: qty 100

## 2019-12-07 MED ORDER — CEFAZOLIN SODIUM-DEXTROSE 2-4 GM/100ML-% IV SOLN
2.0000 g | INTRAVENOUS | Status: AC
  Administered 2019-12-07: 2 g via INTRAVENOUS

## 2019-12-07 SURGICAL SUPPLY — 5 items
CABLE SURGICAL S-101-97-12 (CABLE) ×3 IMPLANT
HEMOSTAT SURGICEL 2X4 FIBR (HEMOSTASIS) ×3 IMPLANT
ICD MOMENTUM D121 (ICD Generator) ×3 IMPLANT
PAD PRO RADIOLUCENT 2001M-C (PAD) ×3 IMPLANT
TRAY PACEMAKER INSERTION (PACKS) ×3 IMPLANT

## 2019-12-07 NOTE — Interval H&P Note (Signed)
ICD Criteria  Current LVEF:40%. Within 12 months prior to implant: Yes   Heart failure history: Yes, Class II  Cardiomyopathy history: Yes, Ischemic Cardiomyopathy - Prior MI.  Atrial Fibrillation/Atrial Flutter: No.  Ventricular tachycardia history: No.  Cardiac arrest history: No.  History of syndromes with risk of sudden death: No.  Previous ICD: Yes, Reason for ICD:  Primary prevention.  Current ICD indication: Primary  PPM indication: No.  Class I or II Bradycardia indication present: Yes  Beta Blocker therapy for 3 or more months: Yes, prescribed.   Ace Inhibitor/ARB therapy for 3 or more months: Yes, prescribed.    I have seen [name] [name] is a 59 y.o. malepre-procedural  for consideration of ICD reimplant for primary prevention of sudden death.  The patient's chart has been reviewed and they meet criteria for ICD implant.  I have had a thorough discussion with the patient reviewing options.  The patient and their family (if available) have had opportunities to ask questions and have them answered. The patient and I have decided together through the [place] Support Tool to  ICD at this time.  Risks, benefits, alternatives to ICD implantation were discussed in detail with the patient today. The patient  understands that the risks include but are not limited to bleeding, infection, pneumothorax, perforation, tamponade, vascular damage, renal failure, MI, stroke, death, inappropriate shocks, and lead dislodgement and  wishes to proceed.  History and Physical Interval Note:  12/07/2019 10:02 AM  [name] [name]  has presented today for surgery, with the diagnosis of cardiomyopathy - eri.  The various methods of treatment have been discussed with the patient and family. After consideration of risks, benefits and other options for treatment, the patient has consented to  Procedure(s): ICD [place] (N/A) as a surgical intervention.  The patient's  history has been reviewed, patient examined, no change in status, stable for surgery.  I have reviewed the patient's chart and labs.  Questions were answered to the patient's satisfaction.     Virl Axe

## 2019-12-07 NOTE — Discharge Instructions (Signed)
Post procedure care instructions Keep incision clean and dry for 10 days. No driving for 2 days.   If your wound is closed with Dermabond (shiny skin glue). Leave this ON, do not peel off or remove.  This will be removed at your wound check visit  If you have a dressing over the wound, you can remove this outer dressing tomorrow. Underneath your wound has steri-strips, (little pieces of tape) LEAVE THEM on until seen in the office for wound check appointment. Call the office [phone]) for redness, drainage, swelling, or fever.    Implantable Cardiac Device Battery Change, Care After  This sheet gives you information about how to care for yourself after your procedure. Your health care provider may also give you more specific instructions. If you have problems or questions, contact your health care provider. What can I expect after the procedure? After your procedure, it is common to have:  Pain or soreness at the site where the cardiac device was inserted.  Swelling at the site where the cardiac device was inserted.  You should received an information card for your new device in 4-8 weeks. Follow these instructions at home: Incision care   Keep the incision clean and dry. ? Do not take baths, swim, or use a hot tub until after your wound check.  ? Do not shower for at least 7 days, or as directed by your health care provider. ? Pat the area dry with a clean towel. Do not rub the area. This may cause bleeding.  Follow instructions from your health care provider about how to take care of your incision. Make sure you: ? Leave stitches (sutures), skin glue, or adhesive strips in place. These skin closures may need to stay in place for 2 weeks or longer. If adhesive strip edges start to loosen and curl up, you may trim the loose edges. Do not remove adhesive strips completely unless your health care provider tells you to do that.  Check your incision area every day for signs of  infection. Check for: ? More redness, swelling, or pain. ? More fluid or blood. ? Warmth. ? Pus or a bad smell. Activity  Do not lift anything that is heavier than 10 lb (4.5 kg) until your health care provider says it is okay to do so.  For the first week, or as long as told by your health care provider: ? Avoid lifting your affected arm higher than your shoulder. ? After 1 week, Be gentle when you move your arms over your head. It is okay to raise your arm to comb your hair. ? Avoid strenuous exercise.  Ask your health care provider when it is okay to: ? Resume your normal activities. ? Return to work or school. ? Resume sexual activity. Eating and drinking  Eat a heart-healthy diet. This should include plenty of fresh fruits and vegetables, whole grains, low-fat dairy products, and lean protein like chicken and fish.  Limit alcohol intake to no more than 1 drink a day for non-pregnant women and 2 drinks a day for men. One drink equals 12 oz of beer, 5 oz of wine, or 1 oz of hard liquor.  Check ingredients and nutrition facts on packaged foods and beverages. Avoid the following types of food: ? Food that is high in salt (sodium). ? Food that is high in saturated fat, like full-fat dairy or red meat. ? Food that is high in trans fat, like fried food. ? Food and drinks that  are high in sugar. Lifestyle  Do not use any products that contain nicotine or tobacco, such as cigarettes and e-cigarettes. If you need help quitting, ask your health care provider.  Take steps to manage and control your weight.  Once cleared, get regular exercise. Aim for 150 minutes of moderate-intensity exercise (such as walking or yoga) or 75 minutes of vigorous exercise (such as running or swimming) each week.  Manage other health problems, such as diabetes or high blood pressure. Ask your health care provider how you can manage these conditions. General instructions  Do not drive for 24 hours after  your procedure if you were given a medicine to help you relax (sedative).  Take over-the-counter and prescription medicines only as told by your health care provider.  Avoid putting pressure on the area where the cardiac device was placed.  If you need an MRI after your cardiac device has been placed, be sure to tell the health care provider who orders the MRI that you have a cardiac device.  Avoid close and prolonged exposure to electrical devices that have strong magnetic fields. These include: ? Cell phones. Avoid keeping them in a pocket near the cardiac device, and try using the ear opposite the cardiac device. ? MP3 players. ? Household appliances, like microwaves. ? Metal detectors. ? Electric generators. ? High-tension wires.  Keep all follow-up visits as directed by your health care provider. This is important. Contact a health care provider if:  You have pain at the incision site that is not relieved by over-the-counter or prescription medicines.  You have any of these around your incision site or coming from it: ? More redness, swelling, or pain. ? Fluid or blood. ? Warmth to the touch. ? Pus or a bad smell.  You have a fever.  You feel brief, occasional palpitations, light-headedness, or any symptoms that you think might be related to your heart. Get help right away if:  You experience chest pain that is different from the pain at the cardiac device site.  You develop a red streak that extends above or below the incision site.  You experience shortness of breath.  You have palpitations or an irregular heartbeat.  You have light-headedness that does not go away quickly.  You faint or have dizzy spells.  Your pulse suddenly drops or increases rapidly and does not return to normal.  You begin to gain weight and your legs and ankles swell. Summary  After your procedure, it is common to have pain, soreness, and some swelling where the cardiac device was  inserted.  Make sure to keep your incision clean and dry. Follow instructions from your health care provider about how to take care of your incision.  Check your incision every day for signs of infection, such as more pain or swelling, pus or a bad smell, warmth, or leaking fluid and blood.  Avoid strenuous exercise and lifting your left arm higher than your shoulder for 2 weeks, or as long as told by your health care provider. This information is not intended to replace advice given to you by your health care provider. Make sure you discuss any questions you have with your health care provider.

## 2019-12-08 MED FILL — Gentamicin Sulfate Inj 40 MG/ML: INTRAMUSCULAR | Qty: 80 | Status: AC

## 2019-12-08 NOTE — Progress Notes (Signed)
Instructed patient with the following information:  No driving for 2 days No shower for 10 days, keep the area clean and dry. No lotion, ointments or creams on incision. Leave the glue on, don't remove it. Call the device clinic if you notice any drainage, swelling or fevers  Call device clinic for any questions [phone].

## 2019-12-17 ENCOUNTER — Ambulatory Visit (INDEPENDENT_AMBULATORY_CARE_PROVIDER_SITE_OTHER): Payer: Medicare HMO | Admitting: *Deleted

## 2019-12-17 ENCOUNTER — Other Ambulatory Visit: Payer: Self-pay

## 2019-12-17 DIAGNOSIS — I429 Cardiomyopathy, unspecified: Secondary | ICD-10-CM

## 2019-12-17 DIAGNOSIS — Z95810 Presence of automatic (implantable) cardiac defibrillator: Secondary | ICD-10-CM | POA: Diagnosis not present

## 2019-12-17 DIAGNOSIS — I428 Other cardiomyopathies: Secondary | ICD-10-CM | POA: Diagnosis not present

## 2019-12-17 LAB — CUP PACEART INCLINIC DEVICE CHECK
Brady Statistic RA Percent Paced: 0 %
Brady Statistic RV Percent Paced: 0 %
Date Time Interrogation Session: 20210225000000
HighPow Impedance: 55 Ohm
Implantable Lead Implant Date: 20101104
Implantable Lead Implant Date: 20101104
Implantable Lead Location: 753859
Implantable Lead Location: 753860
Implantable Lead Model: 158
Implantable Lead Model: 5076
Implantable Lead Serial Number: 301087
Implantable Pulse Generator Implant Date: 20210215
Lead Channel Impedance Value: 500 Ohm
Lead Channel Impedance Value: 513 Ohm
Lead Channel Pacing Threshold Amplitude: 0.7 V
Lead Channel Pacing Threshold Amplitude: 0.7 V
Lead Channel Pacing Threshold Pulse Width: 0.4 ms
Lead Channel Pacing Threshold Pulse Width: 0.4 ms
Lead Channel Sensing Intrinsic Amplitude: 25 mV
Lead Channel Sensing Intrinsic Amplitude: 4 mV
Lead Channel Setting Pacing Amplitude: 2 V
Lead Channel Setting Pacing Amplitude: 2.5 V
Lead Channel Setting Pacing Pulse Width: 0.4 ms
Lead Channel Setting Sensing Sensitivity: 0.5 mV
Pulse Gen Serial Number: 218699

## 2019-12-17 NOTE — Progress Notes (Signed)
ICD  and wound check in clinic . Derma bond removed , wound edges approximated , no redness, drainage or swelling. Normal device function. Thresholds and sensing consistent with previous device measurements. Impedance trends stable over time. No mode switches.. No ventricular arrhythmias. Histogram distribution appropriate for patient and level of activity. No changes made this session. Device programmed at appropriate safety margins. Device programmed to optimize intrinsic conduction. Estimated longevity 13 yrs. Pt enrolled in remote transmission and next remote scheduled for 03/08/20. Patient education completed including shock plan. Follow up with Dr Caryl Comes on 03/08/20.

## 2019-12-17 NOTE — Patient Instructions (Signed)
Call office if you develop swelling, drainage, or redness at wound site.

## 2019-12-21 ENCOUNTER — Ambulatory Visit (INDEPENDENT_AMBULATORY_CARE_PROVIDER_SITE_OTHER): Payer: Medicare HMO

## 2019-12-21 ENCOUNTER — Telehealth: Payer: Self-pay

## 2019-12-21 ENCOUNTER — Other Ambulatory Visit: Payer: Self-pay

## 2019-12-21 DIAGNOSIS — R0989 Other specified symptoms and signs involving the circulatory and respiratory systems: Secondary | ICD-10-CM

## 2019-12-21 DIAGNOSIS — I2510 Atherosclerotic heart disease of native coronary artery without angina pectoris: Secondary | ICD-10-CM | POA: Diagnosis not present

## 2019-12-21 NOTE — Telephone Encounter (Signed)
attempted to call patient. LMTCB 12/21/2019   Per DPR okay, detailed message left. No further orders.   Advised pt to call for any further questions or concerns.

## 2019-12-21 NOTE — Telephone Encounter (Signed)
-----   Message from Rise Mu, PA-C sent at 12/21/2019 12:50 PM EST ----- Carotid artery ultrasound showed stable 1-39% bilateral stenosis.

## 2020-01-04 DIAGNOSIS — Z0100 Encounter for examination of eyes and vision without abnormal findings: Secondary | ICD-10-CM | POA: Diagnosis not present

## 2020-01-04 DIAGNOSIS — H524 Presbyopia: Secondary | ICD-10-CM | POA: Diagnosis not present

## 2020-03-08 ENCOUNTER — Ambulatory Visit (INDEPENDENT_AMBULATORY_CARE_PROVIDER_SITE_OTHER): Payer: Medicare HMO | Admitting: *Deleted

## 2020-03-08 DIAGNOSIS — I4891 Unspecified atrial fibrillation: Secondary | ICD-10-CM | POA: Diagnosis not present

## 2020-03-08 DIAGNOSIS — I429 Cardiomyopathy, unspecified: Secondary | ICD-10-CM

## 2020-03-08 LAB — CUP PACEART REMOTE DEVICE CHECK
Battery Remaining Longevity: 156 mo
Battery Remaining Percentage: 100 %
Brady Statistic RA Percent Paced: 0 %
Brady Statistic RV Percent Paced: 0 %
Date Time Interrogation Session: 20210518002100
HighPow Impedance: 49 Ohm
Implantable Lead Implant Date: 20101104
Implantable Lead Implant Date: 20101104
Implantable Lead Location: 753859
Implantable Lead Location: 753860
Implantable Lead Model: 158
Implantable Lead Model: 5076
Implantable Lead Serial Number: 301087
Implantable Pulse Generator Implant Date: 20210215
Lead Channel Impedance Value: 503 Ohm
Lead Channel Impedance Value: 516 Ohm
Lead Channel Setting Pacing Amplitude: 2 V
Lead Channel Setting Pacing Amplitude: 2.5 V
Lead Channel Setting Pacing Pulse Width: 0.4 ms
Lead Channel Setting Sensing Sensitivity: 0.5 mV
Pulse Gen Serial Number: 218699

## 2020-03-10 NOTE — Progress Notes (Signed)
Remote ICD transmission.   

## 2020-03-22 ENCOUNTER — Encounter: Payer: Self-pay | Admitting: Internal Medicine

## 2020-03-22 ENCOUNTER — Ambulatory Visit (INDEPENDENT_AMBULATORY_CARE_PROVIDER_SITE_OTHER): Payer: Medicare HMO | Admitting: Internal Medicine

## 2020-03-22 ENCOUNTER — Other Ambulatory Visit: Payer: Self-pay

## 2020-03-22 VITALS — BP 100/70 | HR 67 | Ht 70.0 in | Wt 204.0 lb

## 2020-03-22 DIAGNOSIS — E785 Hyperlipidemia, unspecified: Secondary | ICD-10-CM | POA: Diagnosis not present

## 2020-03-22 DIAGNOSIS — I255 Ischemic cardiomyopathy: Secondary | ICD-10-CM

## 2020-03-22 DIAGNOSIS — Z95810 Presence of automatic (implantable) cardiac defibrillator: Secondary | ICD-10-CM | POA: Diagnosis not present

## 2020-03-22 DIAGNOSIS — I5022 Chronic systolic (congestive) heart failure: Secondary | ICD-10-CM

## 2020-03-22 DIAGNOSIS — I10 Essential (primary) hypertension: Secondary | ICD-10-CM | POA: Diagnosis not present

## 2020-03-22 NOTE — Patient Instructions (Signed)
Medication Instructions:  - Your physician recommends that you continue on your current medications as directed. Please refer to the Current Medication list given to you today.  *If you need a refill on your cardiac medications before your next appointment, please call your pharmacy*   Lab Work: - Your physician recommends that you have FASTING lipid profile today  If you have labs (blood work) drawn today and your tests are completely normal, you will receive your results only by: Marland Kitchen MyChart Message (if you have MyChart) OR . A paper copy in the mail If you have any lab test that is abnormal or we need to change your treatment, we will call you to review the results.   Testing/Procedures: - none ordered   Follow-Up: At [facility], you and your health needs are our priority.  As part of our continuing mission to provide you with exceptional heart care, we have created designated Provider Care Teams.  These Care Teams include your primary Cardiologist (physician) and Advanced Practice Providers (APPs -  Physician Assistants and Nurse Practitioners) who all work together to provide you with the care you need, when you need it.  We recommend signing up for the patient portal called "MyChart".  Sign up information is provided on this After Visit Summary.  MyChart is used to connect with patients for Virtual Visits (Telemedicine).  Patients are able to view lab/test results, encounter notes, upcoming appointments, etc.  Non-urgent messages can be sent to your provider as well.   To learn more about what you can do with MyChart, go to [email or web address].    Your next appointment:   9 month(s)  The format for your next appointment:   In Person  Provider:   Virl Axe, MD   Other Instructions n/a

## 2020-03-22 NOTE — Progress Notes (Signed)
Patient Care Team: System, Pcp Not In as PCP - General Wellington Hampshire, MD as Consulting Physician (Cardiology)   HPI  [name] [name] is a 59 y.o. male Seen in followup for ICD implanted 2010  originally as part of MADIT-RIT for primary prevention with ischemic cardiomyopathy with previous ejection fraction of 20%; gen change 2/21   Coronary artery disease s/p CABG and mitral valve repair in 2010;  4/17  Echo 40-45%, previous apical thrombus  now only on ASA   DATE TEST EF   7/10 LHC  35 % LADd-T' RCA-T, CXm-T>>CABG  4/17 Echo   40-45 % Apical thrombus           Date Cr K Hgb LDL  3/18  0.77 4.2     8/19 0.97 4.7  109  2/21 0.94 4.9 15.4 135   The patient denies chest pain, shortness of breath, nocturnal dyspnea, orthopnea or peripheral edema.  There have been no palpitations, lightheadedness or syncope.    Past Medical History:  Diagnosis Date  . Atrial fibrillation ([place])    a. s/p CABG  . Cardiac defibrillator in situ    MADIT RIT trial  . Coronary artery disease    a. s/p 2-V CABG (LIMA-LAD, RIMA-RCA) 2010  . Hyperlipidemia   . Ischemic cardiomyopathy    a. prior EF 20%; b. TTE 4/17: EF 40-45%, ant/antoseptal HK, mild AI, intact mitral valve repair, no sig pulm htn, no evidence of apical thrombus   . Mitral valve disease    a. s/p repair 2010 at time of bypass  . Post-infarction apical thrombus ([place])    a. s/p Coumadin    Past Surgical History:  Procedure Laterality Date  . CARDIAC DEFIBRILLATOR PLACEMENT     [employer]  . COLONOSCOPY WITH PROPOFOL N/A 07/31/2016   Procedure: COLONOSCOPY WITH PROPOFOL;  Surgeon: Lollie Sails, MD;  Location: [facility] ENDOSCOPY;  Service: Endoscopy;  Laterality: N/A;  . CORONARY ARTERY BYPASS GRAFT  2010   LIMA to LAD, RIMA to RCA and mitral valve repair  . ICD GENERATOR CHANGEOUT N/A 12/07/2019   Procedure: ICD GENERATOR CHANGEOUT;  Surgeon: Deboraha Sprang, MD;  Location: [place] CV LAB;  Service:  Cardiovascular;  Laterality: N/A;  . KNEE SURGERY    . MITRAL VALVE REPAIR  2010    Current Outpatient Medications  Medication Sig Dispense Refill  . aspirin EC 81 MG tablet Take 1 tablet (81 mg total) by mouth daily. 90 tablet 3  . atorvastatin (LIPITOR) 80 MG tablet Take 1 tablet (80 mg total) by mouth daily. 90 tablet 3  . carvedilol (COREG) 25 MG tablet Take 1 tablet (25 mg total) by mouth 2 (two) times daily with a meal. 180 tablet 3  . enalapril (VASOTEC) 10 MG tablet Take 1 tablet (10 mg) once daily at night (Patient taking differently: Take 10 mg by mouth daily. ) 90 tablet 3  . ezetimibe (ZETIA) 10 MG tablet Take 1 tablet (10 mg total) by mouth daily. 90 tablet 3  . nitroGLYCERIN (NITROSTAT) 0.4 MG SL tablet Place 1 tablet (0.4 mg total) under the tongue every 5 (five) minutes as needed. 25 tablet 3  . Omega-3 Fatty Acids (FISH OIL) 1200 MG CAPS Take 2,400 mg by mouth daily.     Marland Kitchen spironolactone (ALDACTONE) 25 MG tablet TAKE 1 TABLET (25 MG TOTAL) BY MOUTH DAILY. 90 tablet 3   No current facility-administered medications for this visit.    No  Known Allergies    Review of Systems negative except from HPI and PMH  Physical Exam BP 100/70 (BP Location: Left Arm, Patient Position: Sitting, Cuff Size: Normal)   Pulse 67   Ht 5\' 10"  (1.778 m)   Wt 204 lb (92.5 kg)   SpO2 98%   BMI 29.27 kg/m  Well developed and well nourished in no acute distress HENT normal Neck supple with JVP-flat Clear Device pocket well healed; without hematoma or erythema.  There is no tethering  Regular rate and rhythm, no   murmur Abd-soft with active BS No Clubbing cyanosis  edema Skin-warm and dry A & Oriented  Grossly normal sensory and motor function       Assessment and Plan:   Ischemic cardiomyopathy  \ hyperlipidemia  Implantable defibrillator-[employer]    Without symptoms of ischemia  Device site well healed   Will recheck LDL on atorva 80 which he is tolerating  well   Current medicines are reviewed at length with the patient today .  The patient does not  have concerns regarding medicines.

## 2020-03-23 LAB — CUP PACEART INCLINIC DEVICE CHECK
Brady Statistic RA Percent Paced: 1 %
Brady Statistic RV Percent Paced: 0 %
Date Time Interrogation Session: 20210601000000
HighPow Impedance: 52 Ohm
Implantable Lead Implant Date: 20101104
Implantable Lead Implant Date: 20101104
Implantable Lead Location: 753859
Implantable Lead Location: 753860
Implantable Lead Model: 158
Implantable Lead Model: 5076
Implantable Lead Serial Number: 301087
Implantable Pulse Generator Implant Date: 20210215
Lead Channel Impedance Value: 538 Ohm
Lead Channel Impedance Value: 548 Ohm
Lead Channel Pacing Threshold Amplitude: 0.7 V
Lead Channel Pacing Threshold Amplitude: 1 V
Lead Channel Pacing Threshold Pulse Width: 0.4 ms
Lead Channel Pacing Threshold Pulse Width: 0.4 ms
Lead Channel Sensing Intrinsic Amplitude: 0.5 mV
Lead Channel Sensing Intrinsic Amplitude: 25 mV
Lead Channel Setting Pacing Amplitude: 2 V
Lead Channel Setting Pacing Amplitude: 2.5 V
Lead Channel Setting Pacing Pulse Width: 0.4 ms
Lead Channel Setting Sensing Sensitivity: 0.5 mV
Pulse Gen Serial Number: 218699

## 2020-03-23 LAB — LIPID PANEL
Chol/HDL Ratio: 3.8 ratio (ref 0.0–5.0)
Cholesterol, Total: 160 mg/dL (ref 100–199)
HDL: 42 mg/dL (ref >39)
LDL Chol Calc (NIH): 96 mg/dL (ref 0–99)
Triglycerides: 125 mg/dL (ref 0–149)
VLDL Cholesterol Cal: 22 mg/dL (ref 5–40)

## 2020-06-02 ENCOUNTER — Other Ambulatory Visit: Payer: Self-pay

## 2020-06-02 ENCOUNTER — Encounter: Payer: Self-pay | Admitting: Cardiovascular Disease

## 2020-06-02 ENCOUNTER — Ambulatory Visit: Payer: Medicare HMO | Admitting: Cardiovascular Disease

## 2020-06-02 VITALS — BP 90/60 | HR 62 | Ht 70.0 in | Wt 204.0 lb

## 2020-06-02 DIAGNOSIS — I2510 Atherosclerotic heart disease of native coronary artery without angina pectoris: Secondary | ICD-10-CM | POA: Diagnosis not present

## 2020-06-02 DIAGNOSIS — I10 Essential (primary) hypertension: Secondary | ICD-10-CM | POA: Diagnosis not present

## 2020-06-02 DIAGNOSIS — E785 Hyperlipidemia, unspecified: Secondary | ICD-10-CM | POA: Diagnosis not present

## 2020-06-02 DIAGNOSIS — Z720 Tobacco use: Secondary | ICD-10-CM | POA: Diagnosis not present

## 2020-06-02 DIAGNOSIS — I5022 Chronic systolic (congestive) heart failure: Secondary | ICD-10-CM

## 2020-06-02 NOTE — Patient Instructions (Signed)
Medication Instructions:  Your physician recommends that you continue on your current medications as directed. Please refer to the Current Medication list given to you today.  *If you need a refill on your cardiac medications before your next appointment, please call your pharmacy*   Lab Work: Your physician recommends that you return for a FASTING lipid profile: in 3 months  Please have your lab drawn at the [facility].  Lab hours are Mon-Fri 7am-6pm.   If you have labs (blood work) drawn today and your tests are completely normal, you will receive your results only by: Marland Kitchen MyChart Message (if you have MyChart) OR . A paper copy in the mail If you have any lab test that is abnormal or we need to change your treatment, we will call you to review the results.   Testing/Procedures: Your physician has requested that you have an echocardiogram. Echocardiography is a painless test that uses sound waves to create images of your heart. It provides your doctor with information about the size and shape of your heart and how well your heart's chambers and valves are working. This procedure takes approximately one hour. There are no restrictions for this procedure.     Follow-Up: At [facility], you and your health needs are our priority.  As part of our continuing mission to provide you with exceptional heart care, we have created designated Provider Care Teams.  These Care Teams include your primary Cardiologist (physician) and Advanced Practice Providers (APPs -  Physician Assistants and Nurse Practitioners) who all work together to provide you with the care you need, when you need it.  We recommend signing up for the patient portal called "MyChart".  Sign up information is provided on this After Visit Summary.  MyChart is used to connect with patients for Virtual Visits (Telemedicine).  Patients are able to view lab/test results, encounter notes, upcoming appointments, etc.  Non-urgent  messages can be sent to your provider as well.   To learn more about what you can do with MyChart, go to [email or web address].    Your next appointment:   6 month(s)  The format for your next appointment:   In Person  Provider:    You may see  Dr. Fletcher Anon or one of the following Advanced Practice Providers on your designated Care Team:    Murray Hodgkins, NP  Christell Faith, PA-C  Marrianne Mood, PA-C    Other Instructions N/A

## 2020-06-02 NOTE — Progress Notes (Signed)
Cardiology Office Note   Date:  06/02/2020   ID:  [name] [name], DOB 20-Dec-1960, MRN [id number]  PCP:  Patient, No Pcp Per  Cardiologist:   [name] Sacramento, MD   Chief Complaint  Patient presents with  . OTHER    6 month f/u no complaints today. Meds reviewed verbally with pt.      History of Present Illness: [name] [name] is a 59 y.o. male who is here today for a follow-up visit regarding coronary artery disease and chronic systolic heart failure. He has extensive cardiac history that include coronary artery disease s/p CABG and mitral valve repair in 2010, ischemic cardiomyopathy with previous ejection fraction of 20% most recently 40-45%, previous apical thrombus  and ICD placement for primary prevention. He underwent generator replacement in 11/2019.  Most recent echo in 2017 showed an EF of 40-45%.   He has been doing reasonably well with no recent chest pain or worsening dyspnea.  No orthopnea, PND or leg edema.  He takes his medications regularly.  He reports that his blood pressure is always low in the morning but he denies any dizziness.  He continues to smoke about half a pack per day.  He is working on improving his diet.  Past Medical History:  Diagnosis Date  . Atrial fibrillation ([name] [name])    a. s/p CABG  . Cardiac defibrillator in situ    [name] [name] trial  . Coronary artery disease    a. s/p 2-V CABG (LIMA-LAD, RIMA-RCA) 2010  . Hyperlipidemia   . Ischemic cardiomyopathy    a. prior EF 20%; b. TTE 4/17: EF 40-45%, ant/antoseptal HK, mild AI, intact mitral valve repair, no sig pulm htn, no evidence of apical thrombus   . Mitral valve disease    a. s/p repair 2010 at time of bypass  . Post-infarction apical thrombus ([name] [name])    a. s/p Coumadin    Past Surgical History:  Procedure Laterality Date  . CARDIAC DEFIBRILLATOR PLACEMENT     [employer]  . COLONOSCOPY WITH PROPOFOL N/A 07/31/2016   Procedure: COLONOSCOPY WITH PROPOFOL;  Surgeon: [name] Sails,  MD;  Location: [facility] ENDOSCOPY;  Service: Endoscopy;  Laterality: N/A;  . CORONARY ARTERY BYPASS GRAFT  2010   LIMA to LAD, RIMA to RCA and mitral valve repair  . ICD GENERATOR CHANGEOUT N/A 12/07/2019   Procedure: ICD GENERATOR CHANGEOUT;  Surgeon: Deboraha Sprang, MD;  Location: [place] CV LAB;  Service: Cardiovascular;  Laterality: N/A;  . KNEE SURGERY    . MITRAL VALVE REPAIR  2010     Current Outpatient Medications  Medication Sig Dispense Refill  . aspirin EC 81 MG tablet Take 1 tablet (81 mg total) by mouth daily. 90 tablet 3  . atorvastatin (LIPITOR) 80 MG tablet Take 1 tablet (80 mg total) by mouth daily. 90 tablet 3  . carvedilol (COREG) 25 MG tablet Take 1 tablet (25 mg total) by mouth 2 (two) times daily with a meal. 180 tablet 3  . enalapril (VASOTEC) 10 MG tablet Take 1 tablet (10 mg) once daily at night (Patient taking differently: Take 10 mg by mouth daily. ) 90 tablet 3  . ezetimibe (ZETIA) 10 MG tablet Take 1 tablet (10 mg total) by mouth daily. 90 tablet 3  . nitroGLYCERIN (NITROSTAT) 0.4 MG SL tablet Place 1 tablet (0.4 mg total) under the tongue every 5 (five) minutes as needed. 25 tablet 3  . Omega-3 Fatty Acids (FISH OIL) 1200 MG  CAPS Take 2,400 mg by mouth daily.     Marland Kitchen spironolactone (ALDACTONE) 25 MG tablet TAKE 1 TABLET (25 MG TOTAL) BY MOUTH DAILY. 90 tablet 3   No current facility-administered medications for this visit.    Allergies:   Patient has no known allergies.    Social History:  The patient  reports that he has been smoking cigarettes. He has a 37.00 pack-year smoking history. He has never used smokeless tobacco. He reports that he does not drink alcohol and does not use drugs.   Family History:  The patient's family history includes Heart attack in his mother; Heart attack (age of onset: 61) in his father; Heart failure in his mother; Hyperlipidemia in an other family member.    ROS:  Please see the history of present illness.   Otherwise,  review of systems are positive for none.   All other systems are reviewed and negative.    PHYSICAL EXAM: VS:  BP 90/60 (BP Location: Left Arm, Patient Position: Sitting, Cuff Size: Normal)   Pulse 62   Ht 5\' 10"  (1.778 m)   Wt 204 lb (92.5 kg)   SpO2 98%   BMI 29.27 kg/m  , BMI Body mass index is 29.27 kg/m. GEN: Well nourished, well developed, in no acute distress  HEENT: normal  Neck: no JVD, carotid bruits, or masses Cardiac: RRR; no murmurs, rubs, or gallops,no edema  Respiratory:  clear to auscultation bilaterally, normal work of breathing GI: soft, nontender, nondistended, + BS MS: no deformity or atrophy  Skin: warm and dry, no rash Neuro:  Strength and sensation are intact Psych: euthymic mood, full affect   EKG:  EKG is ordered today. The ekg ordered today demonstrates normal sinus rhythm with possible old inferior infarct.  Anterolateral T wave changes suggestive of ischemia.   Recent Labs: 11/24/2019: ALT 19; BUN 11; Creatinine, Ser 0.94; Hemoglobin 15.4; Platelets 304; Potassium 4.9; Sodium 139    Lipid Panel    Component Value Date/Time   CHOL 160 03/22/2020 1005   TRIG 125 03/22/2020 1005   HDL 42 03/22/2020 1005   CHOLHDL 3.8 03/22/2020 1005   CHOLHDL 3.4 01/15/2017 0737   VLDL 29 01/15/2017 0737   LDLCALC 96 03/22/2020 1005   LDLDIRECT 133 (H) 05/27/2018 1538      Wt Readings from Last 3 Encounters:  06/02/20 204 lb (92.5 kg)  03/22/20 204 lb (92.5 kg)  12/07/19 203 lb (92.1 kg)        ASSESSMENT AND PLAN:  1.  Coronary artery disease involving native coronary arteries without angina: Overall he is doing well with no anginal symptoms. I recommend continuing medical therapy.   2. Chronic systolic heart failure: Most recent ejection fraction was 40-45%. He is in [name] [name] class [name] .  No echocardiogram since 2017.  I requested a follow-up echocardiogram.  If EF is below 40%, I will consider switching enalapril to [facility]  although hypertension might be an issue and might require decreasing some of his other heart failure meds.  3. Status post ICD placement: This is currently being followed by Dr. Caryl [name].  4. Hyperlipidemia: Continue treatment with high dose atorvastatin and Zetia.  I reviewed most recent lipid profile which showed an LDL of 96.  I discussed with him that we need to be more aggressive with this.  I discussed the option of a PCSK9 inhibitor but currently he is working on improving his diet and wants to wait until his lipid profile is  rechecked but he is definitely open to the possibility of further treatment.  Recheck lipid profile in 3 months.  5. Essential hypertension: Blood pressure is well controlled on current medications.  6. Tobacco use: I again discussed with him the importance of smoking cessation.    Disposition:   FU with me in 6 months  Signed,  [name] Sacramento, MD  06/02/2020 8:29 AM    [place]

## 2020-06-06 ENCOUNTER — Ambulatory Visit (INDEPENDENT_AMBULATORY_CARE_PROVIDER_SITE_OTHER): Payer: Medicare HMO

## 2020-06-06 ENCOUNTER — Other Ambulatory Visit: Payer: Self-pay

## 2020-06-06 DIAGNOSIS — I5022 Chronic systolic (congestive) heart failure: Secondary | ICD-10-CM

## 2020-06-06 LAB — ECHOCARDIOGRAM COMPLETE
Area-P 1/2: 5.97 cm2
Calc EF: 45 %
MV M vel: 4.48 m/s
MV Peak grad: 80.3 mmHg
P 1/2 time: 448 msec
Radius: 0.7 cm
S' Lateral: 4.8 cm
Single Plane A2C EF: 44.6 %
Single Plane A4C EF: 42.2 %

## 2020-06-07 ENCOUNTER — Ambulatory Visit (INDEPENDENT_AMBULATORY_CARE_PROVIDER_SITE_OTHER): Payer: Medicare HMO | Admitting: *Deleted

## 2020-06-07 DIAGNOSIS — I428 Other cardiomyopathies: Secondary | ICD-10-CM

## 2020-06-07 DIAGNOSIS — I429 Cardiomyopathy, unspecified: Secondary | ICD-10-CM

## 2020-06-07 LAB — CUP PACEART REMOTE DEVICE CHECK
Battery Remaining Longevity: 156 mo
Battery Remaining Percentage: 100 %
Brady Statistic RA Percent Paced: 0 %
Brady Statistic RV Percent Paced: 0 %
Date Time Interrogation Session: 20210817002100
HighPow Impedance: 50 Ohm
Implantable Lead Implant Date: 20101104
Implantable Lead Implant Date: 20101104
Implantable Lead Location: 753859
Implantable Lead Location: 753860
Implantable Lead Model: 158
Implantable Lead Model: 5076
Implantable Lead Serial Number: 301087
Implantable Pulse Generator Implant Date: 20210215
Lead Channel Impedance Value: 483 Ohm
Lead Channel Impedance Value: 499 Ohm
Lead Channel Setting Pacing Amplitude: 2 V
Lead Channel Setting Pacing Amplitude: 2.5 V
Lead Channel Setting Pacing Pulse Width: 0.4 ms
Lead Channel Setting Sensing Sensitivity: 0.5 mV
Pulse Gen Serial Number: 218699

## 2020-06-09 ENCOUNTER — Telehealth: Payer: Self-pay

## 2020-06-09 NOTE — Telephone Encounter (Signed)
Patient returning call.

## 2020-06-09 NOTE — Telephone Encounter (Signed)
Called to give the patient echo results and Dr. Tyrell Antonio recommendation. [email or web address].

## 2020-06-09 NOTE — Telephone Encounter (Signed)
-----   Message from Wellington Hampshire, MD sent at 06/08/2020  2:14 PM EDT ----- Inform patient that echo showed an EF of 35 to 40% which is slightly decreased from before.  We should attempt to switch him from enalapril to [facility] but low blood pressure might be an issue.  Ask him to monitor his blood pressure and heart rate daily for a week and send [place] the readings so we can make the decision to switch or not.

## 2020-06-09 NOTE — Progress Notes (Signed)
Remote ICD transmission.   

## 2020-06-09 NOTE — Telephone Encounter (Signed)
Patient made aware of echo results and Dr. Tyrell Antonio recommendation. Patient will purchase a BP cuff and monitor his BP and HR for 7 days. Adv the patient to ck his BP 2-3 hours after taking his medications. Patient will call or mychart the readings in 1 week.

## 2020-06-17 ENCOUNTER — Telehealth: Payer: Self-pay | Admitting: Cardiovascular Disease

## 2020-06-17 DIAGNOSIS — I429 Cardiomyopathy, unspecified: Secondary | ICD-10-CM

## 2020-06-17 NOTE — Telephone Encounter (Signed)
Please call to discuss his readings he sent through mychart.

## 2020-06-17 NOTE — Telephone Encounter (Signed)
Patient sts that when he logs in to [facility] he only has the option to "ask a questions." That option does not allow him to attach info. The patient was to monitor his BP/HR for one week and send the readings in.  Patient would like a patient message generated to him through mychart so that he can the respond and include th readings.  Adv the patient that I will do that. Patient voiced appreciation for the assistance.

## 2020-07-01 MED ORDER — ENTRESTO 24-26 MG PO TABS: 1.0000 | ORAL_TABLET | Freq: Two times a day (BID) | ORAL | 1 refills | Status: DC

## 2020-09-06 ENCOUNTER — Ambulatory Visit (INDEPENDENT_AMBULATORY_CARE_PROVIDER_SITE_OTHER): Payer: Medicare HMO

## 2020-09-06 DIAGNOSIS — I428 Other cardiomyopathies: Secondary | ICD-10-CM | POA: Diagnosis not present

## 2020-09-06 DIAGNOSIS — I429 Cardiomyopathy, unspecified: Secondary | ICD-10-CM

## 2020-09-06 LAB — CUP PACEART REMOTE DEVICE CHECK
Battery Remaining Longevity: 156 mo
Battery Remaining Percentage: 100 %
Brady Statistic RA Percent Paced: 0 %
Brady Statistic RV Percent Paced: 0 %
Date Time Interrogation Session: 20211116002200
HighPow Impedance: 49 Ohm
Implantable Lead Implant Date: 20101104
Implantable Lead Implant Date: 20101104
Implantable Lead Location: 753859
Implantable Lead Location: 753860
Implantable Lead Model: 158
Implantable Lead Model: 5076
Implantable Lead Serial Number: 301087
Implantable Pulse Generator Implant Date: 20210215
Lead Channel Impedance Value: 489 Ohm
Lead Channel Impedance Value: 521 Ohm
Lead Channel Setting Pacing Amplitude: 2 V
Lead Channel Setting Pacing Amplitude: 2.5 V
Lead Channel Setting Pacing Pulse Width: 0.4 ms
Lead Channel Setting Sensing Sensitivity: 0.5 mV
Pulse Gen Serial Number: 218699

## 2020-09-08 NOTE — Progress Notes (Signed)
Remote ICD transmission.   

## 2020-12-06 ENCOUNTER — Ambulatory Visit (INDEPENDENT_AMBULATORY_CARE_PROVIDER_SITE_OTHER): Payer: Medicare HMO

## 2020-12-06 DIAGNOSIS — I428 Other cardiomyopathies: Secondary | ICD-10-CM

## 2020-12-06 DIAGNOSIS — I429 Cardiomyopathy, unspecified: Secondary | ICD-10-CM

## 2020-12-06 LAB — CUP PACEART REMOTE DEVICE CHECK
Date Time Interrogation Session: 20220215102031
Implantable Lead Implant Date: 20101104
Implantable Lead Implant Date: 20101104
Implantable Lead Location: 753859
Implantable Lead Location: 753860
Implantable Lead Model: 158
Implantable Lead Model: 5076
Implantable Lead Serial Number: 301087
Implantable Pulse Generator Implant Date: 20210215
Pulse Gen Serial Number: 218699

## 2020-12-13 NOTE — Progress Notes (Signed)
Remote ICD transmission.   

## 2020-12-20 ENCOUNTER — Ambulatory Visit: Payer: Medicare HMO | Admitting: Cardiovascular Disease

## 2021-01-04 ENCOUNTER — Other Ambulatory Visit: Payer: Self-pay | Admitting: Physician Assistant

## 2021-01-05 NOTE — Telephone Encounter (Signed)
Pt confirmed he is taking Enalapril 10 mg qd. Pt mentioned that he has been taken the medication. Please advise if ok to refill Enalapril 10 mg qd.

## 2021-01-05 NOTE — Telephone Encounter (Signed)
Please contact pt for future appointment. Pt overdue for 6 month f/u.

## 2021-01-05 NOTE — Telephone Encounter (Signed)
Pt is overdue for 6 month follow up.  [name] [name] wanted to know if it was [name] [name] to refill Enalapril.

## 2021-01-05 NOTE — Telephone Encounter (Signed)
Patient is scheduled for appointment 01/12/21

## 2021-01-05 NOTE — Telephone Encounter (Signed)
Patient should not be taking enalapril if he is now on Entresto.

## 2021-01-05 NOTE — Telephone Encounter (Signed)
Lmovm to verify if pt is taking Enalapril 10 mg tablet medication not on pts medication list.

## 2021-01-08 NOTE — Progress Notes (Signed)
Cardiology Office Note    Date:  01/12/2021   ID:  [name] [name], DOB 04/30/1961, MRN [id number]  PCP:  Patient, No Pcp Per  Cardiologist:  Kathlyn Sacramento, MD  Electrophysiologist:  Virl Axe, MD   Chief Complaint: Follow up  History of Present Illness:   [name] [name] is a 60 y.o. male with history of CAD status post two-vessel CABG and mitral valve repair in 2010, HFrEF secondary to ICM status post ICD for primary prevention with generator change out in 11/2019, apical mural thrombus status post Coumadin, carotid artery disease, HLD, and tobacco use who presents for follow-up of his CAD and ICM.  He underwent two-vessel CABG with LIMA to LAD and RIMA to RCA along with mitral valve repair in 2010.  Prior EF noted to be 20% with apical mural thrombus.  He underwent ICD implantation as part of the MADIT-RIT trial for primary prevention.  Echo in 01/2016 showed an improved EF of 40 to 45%, anterior and anteroseptal hypokinesis, mild aortic insufficiency, intact mitral valve repair, no significant pulmonary hypertension, and no evidence of apical mural thrombus.  Carotid artery ultrasound from 04/2017 demonstrated no significant carotid artery stenosis along the right side with 50 to 60% stenosis along the left internal carotid artery.  Follow-up carotid artery ultrasound in 05/2018 showed improved bilateral ICA stenosis of 1 to 39%.  He was last seen in the office in 05/2020 and was doing well from a cardiac perspective.  He indicated his BP was typically low in the mornings though denied any dizziness.  He continued to smoke about 1/2 pack/day.  Repeat echo, to evaluate his cardiomyopathy, in 05/2020, showed an EF of 35 to 40%, mildly dilated internal LV cavity size, mild LVH, grade 2 diastolic dysfunction, akinesis of the apical anteroseptal wall, anterior wall, and inferior wall, normal RV systolic function and ventricular cavity size, mildly elevated PASP at 40 mmHg, mildly dilated left atrium,  prior repair of the mitral valve with mild regurgitation and no evidence of stenosis, and mild to moderate aortic valve insufficiency.  Given a slight reduction in his cardiomyopathy on this most recent echo, he was transitioned from ACE inhibitor to [place].  He comes in doing well from a cardiac perspective.  No chest pain, dyspnea, palpitations, dizziness, presyncope, or syncope.  He has tolerated the transition to [facility] without issues with BP typically running in the low 100s to max 427C systolic on Entresto.  No lower extremity swelling, abdominal distention, orthopnea, PND, or early satiety.  He is watching his salt intake.  No falls, hematochezia, or melena.  His weight remains stable.  He did run out of his medications approximately 7 to 10 days ago and expects to get them in the mail today or tomorrow.  He does plan on finding a new PCP as he has been having some issues with joint pains and bilateral peripheral neuropathy.  Otherwise, he does not have any issues or concerns at this time.   Labs independently reviewed: 03/2020 - TC 160, TG 125, HDL 42, LDL 96 11/2019 - Hgb 15.4, PLT 304, BUN 11, serum creatinine 0.94, potassium 4.9, albumin 4.4, AST/ALT normal  Past Medical History:  Diagnosis Date  . Atrial fibrillation ([place])    a. s/p CABG  . Cardiac defibrillator in situ    MADIT RIT trial  . Coronary artery disease    a. s/p 2-V CABG (LIMA-LAD, RIMA-RCA) 2010  . Hyperlipidemia   . Ischemic cardiomyopathy    a.  prior EF 20%; b. TTE 4/17: EF 40-45%, ant/antoseptal HK, mild AI, intact mitral valve repair, no sig pulm htn, no evidence of apical thrombus   . Mitral valve disease    a. s/p repair 2010 at time of bypass  . Post-infarction apical thrombus ([place])    a. s/p Coumadin    Past Surgical History:  Procedure Laterality Date  . CARDIAC DEFIBRILLATOR PLACEMENT     [employer]  . COLONOSCOPY WITH PROPOFOL N/A 07/31/2016   Procedure: COLONOSCOPY WITH PROPOFOL;  Surgeon:  Lollie Sails, MD;  Location: [facility] ENDOSCOPY;  Service: Endoscopy;  Laterality: N/A;  . CORONARY ARTERY BYPASS GRAFT  2010   LIMA to LAD, RIMA to RCA and mitral valve repair  . ICD GENERATOR CHANGEOUT N/A 12/07/2019   Procedure: ICD GENERATOR CHANGEOUT;  Surgeon: Deboraha Sprang, MD;  Location: [place] [facility];  Service: Cardiovascular;  Laterality: N/A;  . KNEE SURGERY    . MITRAL VALVE REPAIR  2010    Current Medications: Current Meds  Medication Sig  . amoxicillin (AMOXIL) 500 MG tablet Take 1 tablet (500 mg total) by mouth See admin instructions. Take 4 tablets one hour before dental procedure.  Marland Kitchen aspirin EC 81 MG tablet Take 1 tablet (81 mg total) by mouth daily.  Marland Kitchen atorvastatin (LIPITOR) 80 MG tablet TAKE 1 TABLET EVERY DAY  . carvedilol (COREG) 25 MG tablet TAKE 1 TABLET TWICE DAILY WITH MEALS  . ezetimibe (ZETIA) 10 MG tablet TAKE 1 TABLET EVERY DAY  . nitroGLYCERIN (NITROSTAT) 0.4 MG SL tablet PLACE 1 TABLET (0.4 MG TOTAL) UNDER THE TONGUE EVERY 5 (FIVE) MINUTES AS NEEDED.  Marland Kitchen Omega-3 Fatty Acids (FISH OIL) 1200 MG CAPS Take 2,400 mg by mouth daily.   . sacubitril-valsartan (ENTRESTO) 24-26 MG Take 1 tablet by mouth 2 (two) times daily.  Marland Kitchen spironolactone (ALDACTONE) 25 MG tablet TAKE 1 TABLET EVERY DAY    Allergies:   Patient has no known allergies.   Social History   Socioeconomic History  . Marital status: Widowed    Spouse name: Not on file  . Number of children: Not on file  . Years of education: Not on file  . Highest education level: Not on file  Occupational History  . Not on file  Tobacco Use  . Smoking status: Current Every Day Smoker    Packs/day: 1.00    Years: 37.00    Pack years: 37.00    Types: Cigarettes    Last attempt to quit: 05/02/2009    Years since quitting: 11.7  . Smokeless tobacco: Never Used  Vaping Use  . Vaping Use: Never used  Substance and Sexual Activity  . Alcohol use: No    Comment: ocassionally  . Drug use: No  . Sexual  activity: Not on file  Other Topics Concern  . Not on file  Social History Narrative  . Not on file   Social Determinants of Health   Financial Resource Strain: Not on file  Food Insecurity: Not on file  Transportation Needs: Not on file  Physical Activity: Not on file  Stress: Not on file  Social Connections: Not on file     Family History:  The patient's family history includes Heart attack in his mother; Heart attack (age of onset: 3) in his father; Heart failure in his mother; Hyperlipidemia in an other family member.  ROS:   Review of Systems  Constitutional: Negative for chills, diaphoresis, fever, malaise/fatigue and weight loss.  HENT: Negative for  congestion.   Eyes: Negative for discharge and redness.  Respiratory: Negative for cough, sputum production, shortness of breath and wheezing.   Cardiovascular: Negative for chest pain, palpitations, orthopnea, claudication, leg swelling and PND.  Gastrointestinal: Negative for abdominal pain, blood in stool, heartburn, melena, nausea and vomiting.  Musculoskeletal: Positive for joint pain. Negative for falls and myalgias.  Skin: Negative for rash.  Neurological: Positive for sensory change. Negative for dizziness, tingling, tremors, speech change, focal weakness, loss of consciousness and weakness.  Endo/Heme/Allergies: Does not bruise/bleed easily.  Psychiatric/Behavioral: Negative for substance abuse. The patient is not nervous/anxious.   All other systems reviewed and are negative.    EKGs/Labs/Other Studies Reviewed:    Studies reviewed were summarized above. The additional studies were reviewed today:  2D echo 05/2020: 1. Left ventricular ejection fraction, by estimation, is 35 to 40%. The  left ventricle has moderately decreased function. The left ventricle  demonstrates regional wall motion abnormalities (see scoring  diagram/findings for description). The left  ventricular internal cavity size was mildly  dilated. There is mild left  ventricular hypertrophy. Left ventricular diastolic parameters are  consistent with Grade II diastolic dysfunction (pseudonormalization).  There is akinesis of the left ventricular,  apical anteroseptal wall, anterior wall and inferior wall.  2. Right ventricular systolic function is normal. The right ventricular  size is normal. There is mildly elevated pulmonary artery systolic  pressure. The estimated right ventricular systolic pressure is 96.2 mmHg.  3. Left atrial size was mildly dilated.  4. The mitral valve has been repaired/replaced. Mild mitral valve  regurgitation. No evidence of mitral stenosis.  5. The aortic valve is normal in structure. Aortic valve regurgitation is  mild to moderate. No aortic stenosis is present.  6. The inferior vena cava is normal in size with greater than 50%  respiratory variability, suggesting right atrial pressure of 3 mmHg.   Comparison(s): Most recent echo in 2017 showed an EF of 40-45%. __________  2D echo 01/2016: - Left ventricle: The cavity size was normal. Systolic function was  mildly to moderately reduced. The estimated ejection fraction was  in the range of 40% to 45%. Hypokinesis of the anterior,  anteroseptal and apical myocardium. Left ventricular diastolic  function parameters were normal.  - Aortic valve: There was mild regurgitation.  - Mitral valve: A mitral valve repair was present.  - Left atrium: The atrium was moderately dilated.  - Right ventricle: Pacer wire or catheter noted in right ventricle.  Systolic function was normal.  - Pulmonary arteries: Systolic pressure was within the normal  range.   Impressions:   - no apical thrombus noted.    EKG:  EKG is ordered today.  The EKG ordered today demonstrates NSR, 66 bpm, prior inferior and anterior infarct with nonspecific ST-T changes, no significant change when compared to prior tracing  Recent Labs: No results found for  requested labs within last 8760 hours.  Recent Lipid Panel    Component Value Date/Time   CHOL 160 03/22/2020 1005   TRIG 125 03/22/2020 1005   HDL 42 03/22/2020 1005   CHOLHDL 3.8 03/22/2020 1005   CHOLHDL 3.4 01/15/2017 0737   VLDL 29 01/15/2017 0737   LDLCALC 96 03/22/2020 1005   LDLDIRECT 133 (H) 05/27/2018 1538    PHYSICAL EXAM:    VS:  BP 130/80 (BP Location: Left Arm, Patient Position: Sitting, Cuff Size: Normal)   Pulse 66   Ht 5\' 10"  (1.778 m)   Wt 204 lb (92.5 kg)  SpO2 98%   BMI 29.27 kg/m   BMI: Body mass index is 29.27 kg/m.  Physical Exam Constitutional:      Appearance: He is well-developed.  HENT:     Head: Normocephalic and atraumatic.  Eyes:     General:        Right eye: No discharge.        Left eye: No discharge.  Neck:     Vascular: No JVD.  Cardiovascular:     Rate and Rhythm: Normal rate and regular rhythm.     Pulses: No midsystolic click and no opening snap.          Posterior tibial pulses are 2+ on the right side and 2+ on the left side.     Heart sounds: Normal heart sounds, S1 normal and S2 normal. Heart sounds not distant. No murmur heard. No friction rub.  Pulmonary:     Effort: Pulmonary effort is normal. No respiratory distress.     Breath sounds: Normal breath sounds. No decreased breath sounds, wheezing or rales.  Chest:     Chest wall: No tenderness.  Abdominal:     General: There is no distension.     Palpations: Abdomen is soft.     Tenderness: There is no abdominal tenderness.  Musculoskeletal:     Cervical back: Normal range of motion.  Skin:    General: Skin is warm and dry.     Nails: There is no clubbing.  Neurological:     Mental Status: He is alert and oriented to person, place, and time.  Psychiatric:        Speech: Speech normal.        Behavior: Behavior normal.        Thought Content: Thought content normal.        Judgment: Judgment normal.     Wt Readings from Last 3 Encounters:  01/12/21 204 lb  (92.5 kg)  06/02/20 204 lb (92.5 kg)  03/22/20 204 lb (92.5 kg)     ASSESSMENT & PLAN:   1. CAD status post CABG without  angina: He is doing well without symptoms concerning for angina.  Continue current medical therapy including aspirin, atorvastatin, Zetia, and carvedilol.  Aggressive risk factor modification including escalation of lipid therapy as indicated as outlined below.  No indication for further ischemic testing at this time.  2. HFrEF secondary to ICM status post ICD: He appears euvolemic and well compensated with NYHA class I symptoms.  Continue current medical therapy including carvedilol, Entresto, and spironolactone.  Historically BP has been on the soft side which precludes escalation of current medical therapy.  Update echo in 05/2020 to evaluate for improved LV systolic function on Entresto.  If his cardiomyopathy persists at that time could consider addition of SGLT2 inhibitor if this proved to be feasible financially.  Weight stable.  CHF education.   3. History of mitral valve repair: Functioning normally with mild regurgitation on most recent echo in 05/2020.  Continue to monitor with periodic echo.  He remains on aspirin.  SBE prophylaxis.  4. History of apical mural thrombus: No evidence of intracardiac thrombus on subsequent echoes.  Status post treatment with Coumadin.  5. Carotid artery disease: Most recent carotid artery ultrasound from 12/2019 showed stable 1 to 39% bilateral ICA stenosis.  Update carotid artery ultrasound in 05/2021.  6. HLD: LDL 96 from 03/2020 with goal being less than 70.  He has been out of atorvastatin and Zetia for approximately 10 days.  We will place a future order for him to have a CMP and lipid panel drawn in approximately 4 to 8 weeks.  If his LDL remains above goal at that time we will transition him to rosuvastatin 40 mg daily with continuation of Zetia.  If LDL remains above goal with this transition we need to consider referring him to the  lipid clinic for consideration of PCSK9 inhibitor.    7. Tobacco use: Complete cessation recommended.  Disposition: F/u with Dr. Fletcher Anon or an APP in 6 months, and EP as directed.   Medication Adjustments/Labs and Tests Ordered: Current medicines are reviewed at length with the patient today.  Concerns regarding medicines are outlined above. Medication changes, Labs and Tests ordered today are summarized above and listed in the Patient Instructions accessible in Encounters.   Signed, Christell Faith, PA-C 01/12/2021 9:18 AM     [address] [place], [place] [address] [phone]

## 2021-01-12 ENCOUNTER — Encounter: Payer: Self-pay | Admitting: Physician Assistant

## 2021-01-12 ENCOUNTER — Other Ambulatory Visit: Payer: Self-pay

## 2021-01-12 ENCOUNTER — Ambulatory Visit: Payer: Medicare HMO | Admitting: Physician Assistant

## 2021-01-12 VITALS — BP 130/80 | HR 66 | Ht 70.0 in | Wt 204.0 lb

## 2021-01-12 DIAGNOSIS — Z9889 Other specified postprocedural states: Secondary | ICD-10-CM

## 2021-01-12 DIAGNOSIS — I5022 Chronic systolic (congestive) heart failure: Secondary | ICD-10-CM

## 2021-01-12 DIAGNOSIS — E785 Hyperlipidemia, unspecified: Secondary | ICD-10-CM

## 2021-01-12 DIAGNOSIS — Z720 Tobacco use: Secondary | ICD-10-CM

## 2021-01-12 DIAGNOSIS — Z951 Presence of aortocoronary bypass graft: Secondary | ICD-10-CM

## 2021-01-12 DIAGNOSIS — I6523 Occlusion and stenosis of bilateral carotid arteries: Secondary | ICD-10-CM | POA: Diagnosis not present

## 2021-01-12 DIAGNOSIS — I255 Ischemic cardiomyopathy: Secondary | ICD-10-CM

## 2021-01-12 DIAGNOSIS — I2510 Atherosclerotic heart disease of native coronary artery without angina pectoris: Secondary | ICD-10-CM

## 2021-01-12 DIAGNOSIS — Z95810 Presence of automatic (implantable) cardiac defibrillator: Secondary | ICD-10-CM

## 2021-01-12 MED ORDER — AMOXICILLIN 500 MG PO TABS: 500.0000 mg | ORAL_TABLET | ORAL | 0 refills | Status: DC

## 2021-01-12 NOTE — Patient Instructions (Signed)
Medication Instructions:  Your physician has recommended you make the following change in your medication:   1. Amoxicillin 500 mg take 4 tablets one hour before dental procedure.   *If you need a refill on your cardiac medications before your next appointment, please call your pharmacy*   Lab Work: CMET & Lipid panel in one month around April 24th. Go to [facility] and check in at registration. No appointment needed. Make sure not to eat or drink after midnight before except sip of water with your medications.   If you have labs (blood work) drawn today and your tests are completely normal, you will receive your results only by: Marland Kitchen MyChart Message (if you have MyChart) OR . A paper copy in the mail If you have any lab test that is abnormal or we need to change your treatment, we will call you to review the results.   Testing/Procedures: Your physician has requested that you have an echocardiogram sometime in August 2022. Echocardiography is a painless test that uses sound waves to create images of your heart. It provides your doctor with information about the size and shape of your heart and how well your heart's chambers and valves are working. This procedure takes approximately one hour. There are no restrictions for this procedure.  Your physician has requested that you have a carotid duplex sometime in August 2022. This test is an ultrasound of the carotid arteries in your neck. It looks at blood flow through these arteries that supply the brain with blood. Allow one hour for this exam. There are no restrictions or special instructions.     Follow-Up: At [facility], you and your health needs are our priority.  As part of our continuing mission to provide you with exceptional heart care, we have created designated Provider Care Teams.  These Care Teams include your primary Cardiologist (physician) and Advanced Practice Providers (APPs -  Physician Assistants and Nurse  Practitioners) who all work together to provide you with the care you need, when you need it.   Your next appointment:   6 month(s)  The format for your next appointment:   In Person  Provider:   Kathlyn Sacramento, MD or Christell Faith, PA-C

## 2021-03-07 ENCOUNTER — Ambulatory Visit (INDEPENDENT_AMBULATORY_CARE_PROVIDER_SITE_OTHER): Payer: Medicare HMO

## 2021-03-07 DIAGNOSIS — I429 Cardiomyopathy, unspecified: Secondary | ICD-10-CM

## 2021-03-07 DIAGNOSIS — I428 Other cardiomyopathies: Secondary | ICD-10-CM

## 2021-03-07 LAB — CUP PACEART REMOTE DEVICE CHECK
Battery Remaining Longevity: 156 mo
Battery Remaining Percentage: 100 %
Brady Statistic RA Percent Paced: 0 %
Brady Statistic RV Percent Paced: 0 %
Date Time Interrogation Session: 20220517002100
HighPow Impedance: 47 Ohm
Implantable Lead Implant Date: 20101104
Implantable Lead Implant Date: 20101104
Implantable Lead Location: 753859
Implantable Lead Location: 753860
Implantable Lead Model: 158
Implantable Lead Model: 5076
Implantable Lead Serial Number: 301087
Implantable Pulse Generator Implant Date: 20210215
Lead Channel Impedance Value: 483 Ohm
Lead Channel Impedance Value: 508 Ohm
Lead Channel Setting Pacing Amplitude: 2 V
Lead Channel Setting Pacing Amplitude: 2.5 V
Lead Channel Setting Pacing Pulse Width: 0.4 ms
Lead Channel Setting Sensing Sensitivity: 0.5 mV
Pulse Gen Serial Number: 218699

## 2021-03-15 ENCOUNTER — Other Ambulatory Visit
Admission: RE | Admit: 2021-03-15 | Discharge: 2021-03-15 | Disposition: A | Discharge status: Home / Self Care | Payer: Medicare HMO | Source: Ambulatory Visit | Attending: Physician Assistant | Admitting: Physician Assistant

## 2021-03-15 DIAGNOSIS — E785 Hyperlipidemia, unspecified: Secondary | ICD-10-CM | POA: Diagnosis not present

## 2021-03-15 DIAGNOSIS — I429 Cardiomyopathy, unspecified: Secondary | ICD-10-CM

## 2021-03-15 DIAGNOSIS — Z79899 Other long term (current) drug therapy: Secondary | ICD-10-CM | POA: Diagnosis not present

## 2021-03-15 DIAGNOSIS — I428 Other cardiomyopathies: Secondary | ICD-10-CM | POA: Insufficient documentation

## 2021-03-15 LAB — BASIC METABOLIC PANEL
Anion gap: 8 (ref 5–15)
BUN: 18 mg/dL (ref 8–23)
CO2: 23 mmol/L (ref 22–32)
Calcium: 9.1 mg/dL (ref 8.9–10.3)
Chloride: 105 mmol/L (ref 98–111)
Creatinine, Ser: 1.02 mg/dL (ref 0.61–1.24)
GFR, Estimated: >60 mL/min (ref >60)
Glucose, Bld: 116 mg/dL — ABNORMAL HIGH (ref 70–99)
Potassium: 4.7 mmol/L (ref 3.5–5.1)
Sodium: 136 mmol/L (ref 135–145)

## 2021-03-15 LAB — LIPID PANEL
Cholesterol: 123 mg/dL (ref 0–200)
HDL: 38 mg/dL — ABNORMAL LOW (ref >40)
LDL Cholesterol: 58 mg/dL (ref 0–99)
Total CHOL/HDL Ratio: 3.2 RATIO
Triglycerides: 136 mg/dL (ref <150)
VLDL: 27 mg/dL (ref 0–40)

## 2021-03-16 ENCOUNTER — Other Ambulatory Visit: Payer: Self-pay

## 2021-03-16 ENCOUNTER — Telehealth: Payer: Self-pay

## 2021-03-16 DIAGNOSIS — Z79899 Other long term (current) drug therapy: Secondary | ICD-10-CM

## 2021-03-16 LAB — HEPATIC FUNCTION PANEL
ALT: 24 U/L (ref 0–44)
AST: 21 U/L (ref 15–41)
Albumin: 4.1 g/dL (ref 3.5–5.0)
Alkaline Phosphatase: 62 U/L (ref 38–126)
Bilirubin, Direct: 0.1 mg/dL (ref 0.0–0.2)
Indirect Bilirubin: 0.9 mg/dL (ref 0.3–0.9)
Total Bilirubin: 1 mg/dL (ref 0.3–1.2)
Total Protein: 7.2 g/dL (ref 6.5–8.1)

## 2021-03-16 NOTE — Telephone Encounter (Signed)
Spoke with Marvetta Gibbons at [facility] lab  to add on a Hepatic Function Panel for today from the lab work done yesterday.

## 2021-03-30 NOTE — Progress Notes (Signed)
Remote ICD transmission.   

## 2021-05-05 ENCOUNTER — Other Ambulatory Visit: Payer: Self-pay

## 2021-05-08 MED ORDER — SPIRONOLACTONE 25 MG PO TABS: ORAL_TABLET | ORAL | 0 refills | Status: DC

## 2021-05-08 MED ORDER — ATORVASTATIN CALCIUM 80 MG PO TABS: 80.0000 mg | ORAL_TABLET | Freq: Every day | ORAL | 0 refills | Status: DC

## 2021-06-06 ENCOUNTER — Ambulatory Visit (INDEPENDENT_AMBULATORY_CARE_PROVIDER_SITE_OTHER): Payer: Medicare HMO

## 2021-06-06 ENCOUNTER — Other Ambulatory Visit: Payer: Self-pay

## 2021-06-06 DIAGNOSIS — I6523 Occlusion and stenosis of bilateral carotid arteries: Secondary | ICD-10-CM | POA: Diagnosis not present

## 2021-06-06 DIAGNOSIS — I2510 Atherosclerotic heart disease of native coronary artery without angina pectoris: Secondary | ICD-10-CM

## 2021-06-06 DIAGNOSIS — I428 Other cardiomyopathies: Secondary | ICD-10-CM

## 2021-06-06 DIAGNOSIS — I429 Cardiomyopathy, unspecified: Secondary | ICD-10-CM

## 2021-06-06 LAB — CUP PACEART REMOTE DEVICE CHECK
Battery Remaining Longevity: 156 mo
Battery Remaining Percentage: 100 %
Brady Statistic RA Percent Paced: 0 %
Brady Statistic RV Percent Paced: 0 %
Date Time Interrogation Session: 20220816002100
HighPow Impedance: 50 Ohm
Implantable Lead Implant Date: 20101104
Implantable Lead Implant Date: 20101104
Implantable Lead Location: 753859
Implantable Lead Location: 753860
Implantable Lead Model: 158
Implantable Lead Model: 5076
Implantable Lead Serial Number: 301087
Implantable Pulse Generator Implant Date: 20210215
Lead Channel Impedance Value: 485 Ohm
Lead Channel Impedance Value: 518 Ohm
Lead Channel Setting Pacing Amplitude: 2 V
Lead Channel Setting Pacing Amplitude: 2.5 V
Lead Channel Setting Pacing Pulse Width: 0.4 ms
Lead Channel Setting Sensing Sensitivity: 0.5 mV
Pulse Gen Serial Number: 218699

## 2021-06-06 LAB — ECHOCARDIOGRAM COMPLETE
AR max vel: 3.34 cm2
AV Area VTI: 3.38 cm2
AV Area mean vel: 3.34 cm2
AV Mean grad: 3 mmHg
AV Peak grad: 5.1 mmHg
Ao pk vel: 1.13 m/s
Area-P 1/2: 3.76 cm2
MV VTI: 2.63 cm2
P 1/2 time: 489 msec
S' Lateral: 4.9 cm
Single Plane A4C EF: 36.9 %

## 2021-06-06 MED ORDER — PERFLUTREN LIPID MICROSPHERE
1.0000 mL | INTRAVENOUS | Status: AC | PRN
  Administered 2021-06-06: 2 mL via INTRAVENOUS

## 2021-06-08 ENCOUNTER — Other Ambulatory Visit: Payer: Self-pay | Admitting: *Deleted

## 2021-06-08 ENCOUNTER — Telehealth: Payer: Self-pay | Admitting: *Deleted

## 2021-06-08 DIAGNOSIS — I255 Ischemic cardiomyopathy: Secondary | ICD-10-CM

## 2021-06-08 DIAGNOSIS — I2510 Atherosclerotic heart disease of native coronary artery without angina pectoris: Secondary | ICD-10-CM

## 2021-06-08 DIAGNOSIS — I5022 Chronic systolic (congestive) heart failure: Secondary | ICD-10-CM

## 2021-06-08 DIAGNOSIS — I6523 Occlusion and stenosis of bilateral carotid arteries: Secondary | ICD-10-CM

## 2021-06-08 MED ORDER — EMPAGLIFLOZIN 10 MG PO TABS
10.0000 mg | ORAL_TABLET | Freq: Every day | ORAL | 3 refills | Status: DC
Start: 2021-06-08 — End: 2021-07-10

## 2021-06-08 NOTE — Telephone Encounter (Signed)
Reviewed results and recommendations with patient. He acknowledged he has had this before with regards to the findings of the echo. We discussed medications Farxiga and Jardiance and having repeat labs (BMET) in 1-2 weeks after he starts that medication. He requested this be sent to [facility]. Entering order it was noted his insurance prefers the [place] and reviewed this with APP and advised patient. Scheduled his follow up with provider and he confirmed. Patient was agreeable with plan with no further questions at this time.

## 2021-06-08 NOTE — Telephone Encounter (Signed)
-----   Message from Theora Gianotti, NP sent at 06/07/2021  3:58 PM EDT ----- Moderately depressed heart squeezing function at 30-35%.  Mildly reduced right heart squeezing function.  Overall, heart squeeze is mildly further reduced from last year, when EF was read a 35-40%.  Mitral valve is working appropriately with only mild leakiness (similar to 05/2020).  Mild to moderate aortic valve leakiness (similar to 05/2020).  I recommend beginning farxiga '10mg'$  daily for ongoing poor heart squeeze.  BMET in ~ 1-2 wks and office f/u in 1-2 mos with Thurmond Butts or Dr. Fletcher Anon.

## 2021-06-26 NOTE — Progress Notes (Signed)
Remote ICD transmission.   

## 2021-07-08 NOTE — Progress Notes (Signed)
Cardiology Office Note    Date:  07/10/2021   ID:  Barbaraann Barthel, DOB 1961/07/13, MRN [id number]  PCP:  Patient, No Pcp Per (Inactive)  Cardiologist:  Kathlyn Sacramento, MD  Electrophysiologist:  Virl Axe, MD   Chief Complaint: Follow up  History of Present Illness:   [name] [name] is a 60 y.o. male with history of CAD status post two-vessel CABG and mitral valve repair in 2010, HFrEF secondary to ICM status post ICD for primary prevention with generator change out in 11/2019, apical mural thrombus status post Coumadin, carotid artery disease, HLD, and tobacco use who presents for follow-up of his CAD and ICM.   He underwent two-vessel CABG with LIMA to LAD and RIMA to RCA along with mitral valve repair in 2010.  Prior EF noted to be 20% with apical mural thrombus.  He underwent ICD implantation as part of the MADIT-RIT trial for primary prevention.  Echo in 01/2016 showed an improved EF of 40 to 45%, anterior and anteroseptal hypokinesis, mild aortic insufficiency, intact mitral valve repair, no significant pulmonary hypertension, and no evidence of apical mural thrombus.  Carotid artery ultrasound from 04/2017 demonstrated no significant carotid artery stenosis along the right side with 50 to 60% stenosis along the left internal carotid artery.  Follow-up carotid artery ultrasound in 05/2018 showed improved bilateral ICA stenosis of 1 to 39%.  Repeat echo, to evaluate his cardiomyopathy, in 05/2020, showed an EF of 35 to 40%, mildly dilated internal LV cavity size, mild LVH, grade 2 diastolic dysfunction, akinesis of the apical anteroseptal wall, anterior wall, and inferior wall, normal RV systolic function and ventricular cavity size, mildly elevated PASP at 40 mmHg, mildly dilated left atrium, prior repair of the mitral valve with mild regurgitation and no evidence of stenosis, and mild to moderate aortic valve insufficiency.  Given a slight reduction in his cardiomyopathy on this most recent  echo, he was transitioned from ACE inhibitor to [place].  He was last seen in the office in 12/2020 and was doing well from a cardiac perspective.  He had ran out of his medications approximately 1 week prior.  Updated echo in 05/2021 showed an EF of 30 to 35%, mildly reduced RV systolic function with normal ventricular cavity size, normal PASP, mildly to moderately dilated left atrium, prior repair of the mitral valve with mild regurgitation and no evidence of stenosis, mild to moderate aortic valve insufficiency, and an estimated right atrial pressure of 3 mmHg.  Given slight reduction in LV systolic function, he was initiated on Farxiga 10 mg daily.  Carotid artery ultrasound in 05/2021 showed a stable 1 to 39% bilateral ICA stenosis with bilateral antegrade flow of the vertebral arteries and normal flow hemodynamics in the bilateral subclavian arteries.   He comes in doing well today.  He has not been able to get the pharmacy to fill SGLT2i for unclear reasons.  No chest pain, dyspnea, palpitations, dizziness, presyncope, syncope, or lower extremity swelling.  He does note an approximate year long history of myalgias and arthralgias, particularly involving the lower extremities.  He does indicate he may have had an atypical presentation of COVID last year and wonders if this is contributing.  His weight has been stable.  He remains on aspirin, atorvastatin, carvedilol, ezetimibe, Entresto, and spironolactone.     Labs independently reviewed: 02/2021 - albumin 4.1, AST/ALT normal, potassium 4.7, BUN 18, SCr 1.02, TC 123, TG 136, HDL 38, LDL 58 11/2019 - Hgb 15.4, PLT 304  Past Medical History:  Diagnosis Date   Atrial fibrillation [facility])    a. s/p CABG   Cardiac defibrillator in situ    MADIT RIT trial   Coronary artery disease    a. s/p 2-V CABG (LIMA-LAD, RIMA-RCA) 2010   Hyperlipidemia    Ischemic cardiomyopathy    a. prior EF 20%; b. TTE 4/17: EF 40-45%, ant/antoseptal HK, mild AI, intact  mitral valve repair, no sig pulm htn, no evidence of apical thrombus    Mitral valve disease    a. s/p repair 2010 at time of bypass   Post-infarction apical thrombus ([place])    a. s/p Coumadin    Past Surgical History:  Procedure Laterality Date   CARDIAC DEFIBRILLATOR PLACEMENT     [facility]   COLONOSCOPY WITH PROPOFOL N/A 07/31/2016   Procedure: COLONOSCOPY WITH PROPOFOL;  Surgeon: Lollie Sails, MD;  Location: [facility] ENDOSCOPY;  Service: Endoscopy;  Laterality: N/A;   CORONARY ARTERY BYPASS GRAFT  2010   LIMA to LAD, RIMA to RCA and mitral valve repair   ICD GENERATOR CHANGEOUT N/A 12/07/2019   Procedure: ICD GENERATOR CHANGEOUT;  Surgeon: Deboraha Sprang, MD;  Location: [place] CV LAB;  Service: Cardiovascular;  Laterality: N/A;   KNEE SURGERY     MITRAL VALVE REPAIR  2010    Current Medications: Current Meds  Medication Sig   amoxicillin (AMOXIL) 500 MG tablet Take 1 tablet (500 mg total) by mouth See admin instructions. Take 4 tablets one hour before dental procedure.   aspirin EC 81 MG tablet Take 1 tablet (81 mg total) by mouth daily.   atorvastatin (LIPITOR) 80 MG tablet Take 1 tablet (80 mg total) by mouth daily.   ezetimibe (ZETIA) 10 MG tablet TAKE 1 TABLET EVERY DAY   nitroGLYCERIN (NITROSTAT) 0.4 MG SL tablet PLACE 1 TABLET (0.4 MG TOTAL) UNDER THE TONGUE EVERY 5 (FIVE) MINUTES AS NEEDED.   Omega-3 Fatty Acids (FISH OIL) 1200 MG CAPS Take 2,400 mg by mouth daily.    [DISCONTINUED] carvedilol (COREG) 25 MG tablet TAKE 1 TABLET TWICE DAILY WITH MEALS   [DISCONTINUED] sacubitril-valsartan (ENTRESTO) 24-26 MG Take 1 tablet by mouth 2 (two) times daily.   [DISCONTINUED] spironolactone (ALDACTONE) 25 MG tablet TAKE 1 TABLET EVERY DAY    Allergies:   Patient has no known allergies.   Social History   Socioeconomic History   Marital status: Widowed    Spouse name: Not on file   Number of children: Not on file   Years of education: Not on file   Highest  education level: Not on file  Occupational History   Not on file  Tobacco Use   Smoking status: Every Day    Packs/day: 1.00    Years: 37.00    Pack years: 37.00    Types: Cigarettes    Last attempt to quit: 05/02/2009    Years since quitting: 12.1   Smokeless tobacco: Never  Vaping Use   Vaping Use: Never used  Substance and Sexual Activity   Alcohol use: No    Comment: ocassionally   Drug use: No   Sexual activity: Not on file  Other Topics Concern   Not on file  Social History Narrative   Not on file   Social Determinants of Health   Financial Resource Strain: Not on file  Food Insecurity: Not on file  Transportation Needs: Not on file  Physical Activity: Not on file  Stress: Not on file  Social Connections: Not on  file     Family History:  The patient's family history includes Heart attack in his mother; Heart attack (age of onset: 31) in his father; Heart failure in his mother; Hyperlipidemia in an other family member.  ROS:   Review of Systems  Constitutional:  Negative for chills, diaphoresis, fever and weight loss.  HENT:  Negative for congestion.   Eyes:  Negative for discharge and redness.  Respiratory:  Negative for cough, sputum production, shortness of breath and wheezing.   Cardiovascular:  Negative for chest pain, palpitations, orthopnea, claudication, leg swelling and PND.  Musculoskeletal:  Positive for joint pain and myalgias. Negative for falls.  Skin:  Negative for rash.  Neurological:  Negative for dizziness, tingling, tremors, sensory change, speech change, focal weakness, loss of consciousness and weakness.  Endo/Heme/Allergies:  Does not bruise/bleed easily.  Psychiatric/Behavioral:  Negative for substance abuse. The patient is not nervous/anxious.   All other systems reviewed and are negative.   EKGs/Labs/Other Studies Reviewed:    Studies reviewed were summarized above. The additional studies were reviewed today:  2D echo 05/2021:  1.  Left ventricular ejection fraction, by estimation, is 30 to 35%. The  left ventricle has moderately decreased function. The left ventricle  demonstrates regional wall motion abnormalities (see scoring  diagram/findings for description). Left ventricular   diastolic function could not be evaluated.   2. Right ventricular systolic function is mildly reduced. The right  ventricular size is normal. There is normal pulmonary artery systolic  pressure.   3. Left atrial size was mild to moderately dilated.   4. The mitral valve has been repaired/replaced. Mild mitral valve  regurgitation. No evidence of mitral stenosis. There is a prosthetic  annuloplasty ring present in the mitral position. Procedure Date: 2010.   5. The aortic valve is tricuspid. Aortic valve regurgitation is mild to  moderate. No aortic stenosis is present.   6. The inferior vena cava is normal in size with greater than 50%  respiratory variability, suggesting right atrial pressure of 3 mmHg. __________  2D echo 05/2020: 1. Left ventricular ejection fraction, by estimation, is 35 to 40%. The  left ventricle has moderately decreased function. The left ventricle  demonstrates regional wall motion abnormalities (see scoring  diagram/findings for description). The left  ventricular internal cavity size was mildly dilated. There is mild left  ventricular hypertrophy. Left ventricular diastolic parameters are  consistent with Grade II diastolic dysfunction (pseudonormalization).  There is akinesis of the left ventricular,  apical anteroseptal wall, anterior wall and inferior wall.   2. Right ventricular systolic function is normal. The right ventricular  size is normal. There is mildly elevated pulmonary artery systolic  pressure. The estimated right ventricular systolic pressure is [id number] mmHg.   3. Left atrial size was mildly dilated.   4. The mitral valve has been repaired/replaced. Mild mitral valve  regurgitation. No  evidence of mitral stenosis.   5. The aortic valve is normal in structure. Aortic valve regurgitation is  mild to moderate. No aortic stenosis is present.   6. The inferior vena cava is normal in size with greater than 50%  respiratory variability, suggesting right atrial pressure of 3 mmHg.   Comparison(s): Most recent echo in 2017 showed an EF of 40-45%. __________   2D echo 01/2016: - Left ventricle: The cavity size was normal. Systolic function was    mildly to moderately reduced. The estimated ejection fraction was    in the range of 40% to 45%. Hypokinesis of  the anterior,    anteroseptal and apical myocardium. Left ventricular diastolic    function parameters were normal.  - Aortic valve: There was mild regurgitation.  - Mitral valve: A mitral valve repair was present.  - Left atrium: The atrium was moderately dilated.  - Right ventricle: Pacer wire or catheter noted in right ventricle.    Systolic function was normal.  - Pulmonary arteries: Systolic pressure was within the normal    range.   Impressions:   - no apical thrombus noted.     EKG:  EKG is ordered today.  The EKG ordered today demonstrates NSR, 62 bpm, prior inferior infarct, possible prior anterior infarct, anterolateral T wave inversion, when compared to prior tracing no significant change  Recent Labs: 03/15/2021: ALT 24; BUN 18; Creatinine, Ser 1.02; Potassium 4.7; Sodium 136  Recent Lipid Panel    Component Value Date/Time   CHOL 123 03/15/2021 1050   CHOL 160 03/22/2020 1005   TRIG 136 03/15/2021 1050   HDL 38 (L) 03/15/2021 1050   HDL 42 03/22/2020 1005   CHOLHDL 3.2 03/15/2021 1050   VLDL 27 03/15/2021 1050   LDLCALC 58 03/15/2021 1050   LDLCALC 96 03/22/2020 1005   LDLDIRECT 133 (H) 05/27/2018 1538    PHYSICAL EXAM:    VS:  BP 112/70 (BP Location: Left Arm, Patient Position: Sitting, Cuff Size: Normal)   Pulse 62   Ht '5\' 10"'$  (1.778 m)   Wt 202 lb (91.6 kg)   SpO2 97%   BMI 28.98 kg/m    BMI: Body mass index is 28.98 kg/m.  Physical Exam Constitutional:      Appearance: He is well-developed.  HENT:     Head: Normocephalic and atraumatic.  Eyes:     General:        Right eye: No discharge.        Left eye: No discharge.  Neck:     Vascular: No JVD.  Cardiovascular:     Rate and Rhythm: Normal rate and regular rhythm.     Pulses:          Posterior tibial pulses are 2+ on the right side and 2+ on the left side.     Heart sounds: Normal heart sounds, S1 normal and S2 normal. Heart sounds not distant. No midsystolic click and no opening snap. No murmur heard.   No friction rub.  Pulmonary:     Effort: Pulmonary effort is normal. No respiratory distress.     Breath sounds: Normal breath sounds. No decreased breath sounds, wheezing or rales.  Chest:     Chest wall: No tenderness.  Abdominal:     General: There is no distension.     Palpations: Abdomen is soft.     Tenderness: There is no abdominal tenderness.  Musculoskeletal:     Cervical back: Normal range of motion.     Right lower leg: No edema.     Left lower leg: No edema.  Skin:    General: Skin is warm and dry.     Nails: There is no clubbing.  Neurological:     Mental Status: He is alert and oriented to person, place, and time.  Psychiatric:        Speech: Speech normal.        Behavior: Behavior normal.        Thought Content: Thought content normal.        Judgment: Judgment normal.    Wt Readings from Last 3 Encounters:  07/10/21 202 lb (91.6 kg)  01/12/21 204 lb (92.5 kg)  06/02/20 204 lb (92.5 kg)     ASSESSMENT & PLAN:   CAD status post CABG without angina: He is doing well without symptoms concerning for angina.  Continue current medical therapy including aspirin and carvedilol.  We will temporarily suspend atorvastatin and ezetimibe to see if this leads to an improvement in his arthralgias and myalgias as outlined below.  Aggressive risk factor modification.  No indication for ischemic  testing at this time.  However, should his cardiomyopathy persist despite escalation/optimization of GDMT as outlined below we will need to pursue diagnostic R/LHC.  HFrEF secondary to ICM status post ICD: He appears euvolemic and well compensated with NYHA class I symptoms.  He has been unable to obtain the Jardiance for unclear reasons.  We have offered samples and will contact his pharmacy to see what is needed to get this medication filled.  Otherwise, he will continue carvedilol, Entresto, and spironolactone.  Not requiring a standing loop diuretic.  Once he has been placed and maintained on maximally tolerated GDMT for several months, we will look to update a limited echo in early 2023 to evaluate for improvement in his cardiomyopathy.  If his EF remains low at that time we will pursue diagnostic R/LHC.  Weight stable.  Check BMP today.  History of mitral valve repair: Stable on echo in 05/2021.  History of apical mural thrombus: No evidence of mural thrombus on follow-up echocardiograms.  Status post therapy with Coumadin.  No indication for further [place] at this time.  Carotid artery disease: Ultrasound from 05/2021 showed stable 1 to 39% bilateral ICA stenosis.  He remains on aspirin and atorvastatin.  HLD: LDL 58 in 02/2021.  Currently on atorvastatin and ezetimibe.  These will be held temporarily as outlined below.  Tobacco use: He has quit smoking.  Congratulations were offered.  Arthralgias/myalgias: Hold atorvastatin and ezetimibe for 2 to 3 weeks.  He will send [place] a MyChart message at that time to update [place] on how he is feeling.  If his myalgias and arthralgias persist, we will plan to resume these medications.  However, if they resolve, we will consider rechallenge in him with rosuvastatin.  Disposition: F/u with Dr. Fletcher Anon or an APP in 1 month, and Dr. Caryl Comes as directed.   Medication Adjustments/Labs and Tests Ordered: Current medicines are reviewed at length with the patient today.   Concerns regarding medicines are outlined above. Medication changes, Labs and Tests ordered today are summarized above and listed in the Patient Instructions accessible in Encounters.   Signed, Christell Faith, PA-C 07/10/2021 11:44 AM     [facility] [address] [place] [address] [place], [place] [address] [phone]

## 2021-07-10 ENCOUNTER — Other Ambulatory Visit: Payer: Self-pay

## 2021-07-10 ENCOUNTER — Encounter: Payer: Self-pay | Admitting: Physician Assistant

## 2021-07-10 ENCOUNTER — Ambulatory Visit: Payer: Medicare HMO | Admitting: Physician Assistant

## 2021-07-10 VITALS — BP 112/70 | HR 62 | Ht 70.0 in | Wt 202.0 lb

## 2021-07-10 DIAGNOSIS — I5020 Unspecified systolic (congestive) heart failure: Secondary | ICD-10-CM | POA: Diagnosis not present

## 2021-07-10 DIAGNOSIS — Z951 Presence of aortocoronary bypass graft: Secondary | ICD-10-CM | POA: Diagnosis not present

## 2021-07-10 DIAGNOSIS — Z9889 Other specified postprocedural states: Secondary | ICD-10-CM

## 2021-07-10 DIAGNOSIS — I2510 Atherosclerotic heart disease of native coronary artery without angina pectoris: Secondary | ICD-10-CM | POA: Diagnosis not present

## 2021-07-10 DIAGNOSIS — I6523 Occlusion and stenosis of bilateral carotid arteries: Secondary | ICD-10-CM

## 2021-07-10 DIAGNOSIS — Z87891 Personal history of nicotine dependence: Secondary | ICD-10-CM

## 2021-07-10 DIAGNOSIS — E785 Hyperlipidemia, unspecified: Secondary | ICD-10-CM | POA: Diagnosis not present

## 2021-07-10 DIAGNOSIS — M791 Myalgia, unspecified site: Secondary | ICD-10-CM

## 2021-07-10 DIAGNOSIS — Z95810 Presence of automatic (implantable) cardiac defibrillator: Secondary | ICD-10-CM | POA: Diagnosis not present

## 2021-07-10 DIAGNOSIS — I255 Ischemic cardiomyopathy: Secondary | ICD-10-CM | POA: Diagnosis not present

## 2021-07-10 DIAGNOSIS — M2550 Pain in unspecified joint: Secondary | ICD-10-CM

## 2021-07-10 MED ORDER — EMPAGLIFLOZIN 10 MG PO TABS: 10.0000 mg | ORAL_TABLET | Freq: Every day | ORAL | 1 refills | Status: DC

## 2021-07-10 MED ORDER — SPIRONOLACTONE 25 MG PO TABS: ORAL_TABLET | ORAL | 1 refills | Status: DC

## 2021-07-10 MED ORDER — ENTRESTO 24-26 MG PO TABS: 1.0000 | ORAL_TABLET | Freq: Two times a day (BID) | ORAL | 1 refills | Status: DC

## 2021-07-10 MED ORDER — CARVEDILOL 25 MG PO TABS
25.0000 mg | ORAL_TABLET | Freq: Two times a day (BID) | ORAL | 1 refills | Status: DC
Start: 2021-07-10 — End: 2022-01-08

## 2021-07-10 NOTE — Patient Instructions (Signed)
Medication Instructions:  Your physician recommends that you continue on your current medications as directed. Please refer to the Current Medication list given to you today.  Your cardiac medication have been refilled today   Medication Samples have been provided to you tday  Drug name: Jardiance        Strength: 10 mg Qty: 2 bottles   LOT: [id number]   Exp.Date: 9/23  Dosing instructions: 1 tablet daily  HOLD Lipitor and Zetia for 2 weeks to se if your myalgia improves.  *If you need a refill on your cardiac medications before your next appointment, please call your pharmacy*   Lab Work: Bmp today  If you have labs (blood work) drawn today and your tests are completely normal, you will receive your results only by: [place] (if you have MyChart) OR A paper copy in the mail If you have any lab test that is abnormal or we need to change your treatment, we will call you to review the results.   Testing/Procedures: None ordered   Follow-Up: At [facility], you and your health needs are our priority.  As part of our continuing mission to provide you with exceptional heart care, we have created designated Provider Care Teams.  These Care Teams include your primary Cardiologist (physician) and Advanced Practice Providers (APPs -  Physician Assistants and Nurse Practitioners) who all work together to provide you with the care you need, when you need it.  We recommend signing up for the patient portal called "MyChart".  Sign up information is provided on this After Visit Summary.  MyChart is used to connect with patients for Virtual Visits (Telemedicine).  Patients are able to view lab/test results, encounter notes, upcoming appointments, etc.  Non-urgent messages can be sent to your provider as well.   To learn more about what you can do with MyChart, go to [email or web address].    Your next appointment:   4 week(s)  The format for your next appointment:   In  Person  Provider:   You may see Kathlyn Sacramento, MD or one of the following Advanced Practice Providers on your designated Care Team:    Christell Faith, [place]  Other Instructions N/A

## 2021-07-11 LAB — BASIC METABOLIC PANEL
BUN/Creatinine Ratio: 14 (ref 10–24)
BUN: 15 mg/dL (ref 8–27)
CO2: 21 mmol/L (ref 20–29)
Calcium: 9.3 mg/dL (ref 8.6–10.2)
Chloride: 105 mmol/L (ref 96–106)
Creatinine, Ser: 1.08 mg/dL (ref 0.76–1.27)
Glucose: 107 mg/dL — ABNORMAL HIGH (ref 65–99)
Potassium: 5.1 mmol/L (ref 3.5–5.2)
Sodium: 140 mmol/L (ref 134–144)
eGFR: 78 mL/min/{1.73_m2} (ref >59)

## 2021-08-13 NOTE — Progress Notes (Signed)
Cardiology Office Note    Date:  08/14/2021   ID:  [name] [name], DOB 1961-04-19, MRN [id number]  PCP:  Patient, No Pcp Per (Inactive)  Cardiologist:  Kathlyn Sacramento, MD  Electrophysiologist:  Virl Axe, MD   Chief Complaint: Follow up  History of Present Illness:   [name] [name] is a 60 y.o. male with history of CAD status post two-vessel CABG and mitral valve repair in 2010, HFrEF secondary to ICM status post ICD for primary prevention with generator change out in 11/2019, apical mural thrombus status post Coumadin, carotid artery disease, HLD, and tobacco use who presents for follow-up of HLD.   He underwent two-vessel CABG with LIMA to LAD and RIMA to RCA along with mitral valve repair in 2010.  Prior EF noted to be 20% with apical mural thrombus.  He underwent ICD implantation as part of the MADIT-RIT trial for primary prevention.  Echo in 01/2016 showed an improved EF of 40 to 45%, anterior and anteroseptal hypokinesis, mild aortic insufficiency, intact mitral valve repair, no significant pulmonary hypertension, and no evidence of apical mural thrombus.  Carotid artery ultrasound from 04/2017 demonstrated no significant carotid artery stenosis along the right side with 50 to 60% stenosis along the left internal carotid artery.  Follow-up carotid artery ultrasound in 05/2018 showed improved bilateral ICA stenosis of 1 to 39%.  Repeat echo, to evaluate his cardiomyopathy, in 05/2020, showed an EF of 35 to 40%, mildly dilated internal LV cavity size, mild LVH, grade 2 diastolic dysfunction, akinesis of the apical anteroseptal wall, anterior wall, and inferior wall, normal RV systolic function and ventricular cavity size, mildly elevated PASP at 40 mmHg, mildly dilated left atrium, prior repair of the mitral valve with mild regurgitation and no evidence of stenosis, and mild to moderate aortic valve insufficiency.  Given a slight reduction in his cardiomyopathy on this most recent echo, he was  transitioned from ACE inhibitor to [place].   He was last seen in the office in 12/2020 and was doing well from a cardiac perspective.  He had ran out of his medications approximately 1 week prior.  Updated echo in 05/2021 showed an EF of 30 to 35%, mildly reduced RV systolic function with normal ventricular cavity size, normal PASP, mildly to moderately dilated left atrium, prior repair of the mitral valve with mild regurgitation and no evidence of stenosis, mild to moderate aortic valve insufficiency, and an estimated right atrial pressure of 3 mmHg.  Given slight reduction in LV systolic function, he was initiated on Farxiga 10 mg daily.  Carotid artery ultrasound in 05/2021 showed a stable 1 to 39% bilateral ICA stenosis with bilateral antegrade flow of the vertebral arteries and normal flow hemodynamics in the bilateral subclavian arteries.  He was last seen on 07/10/2021 and was doing well from a cardiac perspective.  It was noted he has been unable to fill SGLT2i, though was subsequently started on this after contacting his pharmacy.  He reported an approximate year-long history of myalgias and arthralgias, particularly involving the lower extremities.  Given this, we underwent a trial of holding atorvastatin and ezetimibe.  With this, he only noted a minor/minimal improvement in his lower extremity discomfort, and reported when this discomfort initially began in September 2021 it was "quite severe."  He was advised to resume atorvastatin and ezetimibe.  He comes in doing well from a cardiac perspective.  No angina, dyspnea, palpitations, dizziness, presyncope, syncope, lower extremity swelling, or orthopnea.  His weight is  stable.  He has resumed atorvastatin and ezetimibe without any significant worsening in his hip discomfort.  He does feel like this discomfort is gradually improving.     Labs independently reviewed: 06/2021 - BUN 15, SCr 1.08, potassium 5.1 02/2021 - albumin 4.1, AST/ALT normal, TC  123, TG 136, HDL 38, LDL 58 11/2019 - Hgb 15.4, PLT 304    Past Medical History:  Diagnosis Date   Atrial fibrillation ([place])    a. s/p CABG   Cardiac defibrillator in situ    MADIT RIT trial   Coronary artery disease    a. s/p 2-V CABG (LIMA-LAD, RIMA-RCA) 2010   Hyperlipidemia    Ischemic cardiomyopathy    a. prior EF 20%; b. TTE 4/17: EF 40-45%, ant/antoseptal HK, mild AI, intact mitral valve repair, no sig pulm htn, no evidence of apical thrombus    Mitral valve disease    a. s/p repair 2010 at time of bypass   Post-infarction apical thrombus ([name] [name])    a. s/p Coumadin    Past Surgical History:  Procedure Laterality Date   CARDIAC DEFIBRILLATOR PLACEMENT     [employer]   COLONOSCOPY WITH PROPOFOL N/A 07/31/2016   Procedure: COLONOSCOPY WITH PROPOFOL;  Surgeon: Lollie Sails, MD;  Location[facility] END[facility]COPY;  Service: Endoscopy;  Laterality: N/A;   CORONARY ARTERY BYPASS GRAFT  2010   LIMA to LAD, RIMA to RCA and mitral valve repair   ICD GENERATOR CHANGEOUT N/A 12/07/2019   Procedure: ICD GENERATOR CHANGEOUT;  Surgeon: Deboraha Sprang, MD;  Location: [facility] CV LAB;  Service: Cardiovascular;  Laterality: N/A;   KNEE SURGERY     MITRAL VALVE REPAIR  2010    Current Medications: Current Meds  Medication Sig   amoxicillin (AMOXIL) 500 MG tablet Take 1 tablet (500 mg total) by mouth See admin instructions. Take 4 tablets one hour before dental procedure.   aspirin EC 81 MG tablet Take 1 tablet (81 mg total) by mouth daily.   atorvastatin (LIPITOR) 80 MG tablet Take 1 tablet (80 mg total) by mouth daily.   carvedilol (COREG) 25 MG tablet Take 1 tablet (25 mg total) by mouth 2 (two) times daily with a meal.   empagliflozin (JARDIANCE) 10 MG TABS tablet Take 1 tablet (10 mg total) by mouth daily.   ezetimibe (ZETIA) 10 MG tablet TAKE 1 TABLET EVERY DAY   nitroGLYCERIN (NITROSTAT) 0.4 MG SL tablet PLACE 1 TABLET (0.4 MG TOTAL) UNDER THE TONGUE EVERY 5 (FIVE) MINUTES  AS NEEDED.   Omega-3 Fatty Acids (FISH OIL) 1200 MG CAPS Take 2,400 mg by mouth daily.    sacubitril-valsartan (ENTRESTO) 24-26 MG Take 1 tablet by mouth 2 (two) times daily.   spironolactone (ALDACTONE) 25 MG tablet TAKE 1 TABLET EVERY DAY    Allergies:   Patient has no known allergies.   Social History   Socioeconomic History   Marital status: Widowed    Spouse name: Not on file   Number of children: Not on file   Years of education: Not on file   Highest education level: Not on file  Occupational History   Not on file  Tobacco Use   Smoking status: Every Day    Packs/day: 1.00    Years: 37.00    Pack years: 37.00    Types: Cigarettes    Last attempt to quit: 05/02/2009    Years since quitting: 12.2   Smokeless tobacco: Never  Vaping Use   Vaping Use: Never used  Substance and Sexual Activity   Alcohol use: No    Comment: ocassionally   Drug use: No   Sexual activity: Not on file  Other Topics Concern   Not on file  Social History Narrative   Not on file   Social Determinants of Health   Financial Resource Strain: Not on file  Food Insecurity: Not on file  Transportation Needs: Not on file  Physical Activity: Not on file  Stress: Not on file  Social Connections: Not on file     Family History:  The patient's family history includes Heart attack in his mother; Heart attack (age of onset: 98) in his father; Heart failure in his mother; Hyperlipidemia in an other family member.  ROS:   Review of Systems  Constitutional:  Negative for chills, diaphoresis, fever, malaise/fatigue and weight loss.  HENT:  Negative for congestion.   Eyes:  Negative for discharge and redness.  Respiratory:  Negative for cough, sputum production, shortness of breath and wheezing.   Cardiovascular:  Negative for chest pain, palpitations, orthopnea, claudication, leg swelling and PND.  Gastrointestinal:  Negative for abdominal pain, heartburn, nausea and vomiting.  Musculoskeletal:   Positive for joint pain and myalgias. Negative for falls.  Skin:  Negative for rash.  Neurological:  Negative for dizziness, tingling, tremors, sensory change, speech change, focal weakness, loss of consciousness and weakness.  Endo/Heme/Allergies:  Does not bruise/bleed easily.  Psychiatric/Behavioral:  Negative for substance abuse. The patient is not nervous/anxious.   All other systems reviewed and are negative.   EKGs/Labs/Other Studies Reviewed:    Studies reviewed were summarized above. The additional studies were reviewed today:  2D echo 05/2021:  1. Left ventricular ejection fraction, by estimation, is 30 to 35%. The  left ventricle has moderately decreased function. The left ventricle  demonstrates regional wall motion abnormalities (see scoring  diagram/findings for description). Left ventricular   diastolic function could not be evaluated.   2. Right ventricular systolic function is mildly reduced. The right  ventricular size is normal. There is normal pulmonary artery systolic  pressure.   3. Left atrial size was mild to moderately dilated.   4. The mitral valve has been repaired/replaced. Mild mitral valve  regurgitation. No evidence of mitral stenosis. There is a prosthetic  annuloplasty ring present in the mitral position. Procedure Date: 2010.   5. The aortic valve is tricuspid. Aortic valve regurgitation is mild to  moderate. No aortic stenosis is present.   6. The inferior vena cava is normal in size with greater than 50%  respiratory variability, suggesting right atrial pressure of 3 mmHg. __________   2D echo 05/2020: 1. Left ventricular ejection fraction, by estimation, is 35 to 40%. The  left ventricle has moderately decreased function. The left ventricle  demonstrates regional wall motion abnormalities (see scoring  diagram/findings for description). The left  ventricular internal cavity size was mildly dilated. There is mild left  ventricular hypertrophy.  Left ventricular diastolic parameters are  consistent with Grade II diastolic dysfunction (pseudonormalization).  There is akinesis of the left ventricular,  apical anteroseptal wall, anterior wall and inferior wall.   2. Right ventricular systolic function is normal. The right ventricular  size is normal. There is mildly elevated pulmonary artery systolic  pressure. The estimated right ventricular systolic pressure is 61.9 mmHg.   3. Left atrial size was mildly dilated.   4. The mitral valve has been repaired/replaced. Mild mitral valve  regurgitation. No evidence of mitral stenosis.   5.  The aortic valve is normal in structure. Aortic valve regurgitation is  mild to moderate. No aortic stenosis is present.   6. The inferior vena cava is normal in size with greater than 50%  respiratory variability, suggesting right atrial pressure of 3 mmHg.   Comparison(s): Most recent echo in 2017 showed an EF of 40-45%. __________   2D echo 01/2016: - Left ventricle: The cavity size was normal. Systolic function was    mildly to moderately reduced. The estimated ejection fraction was    in the range of 40% to 45%. Hypokinesis of the anterior,    anteroseptal and apical myocardium. Left ventricular diastolic    function parameters were normal.  - Aortic valve: There was mild regurgitation.  - Mitral valve: A mitral valve repair was present.  - Left atrium: The atrium was moderately dilated.  - Right ventricle: Pacer wire or catheter noted in right ventricle.    Systolic function was normal.  - Pulmonary arteries: Systolic pressure was within the normal    range.   Impressions:   - no apical thrombus noted.    EKG:  EKG is ordered today.  The EKG ordered today demonstrates NSR, 73 bpm, nonspecific IVCD, prior inferior infarct, anterolateral T wave inversion, when compared to prior tracing no significant change  Recent Labs: 03/15/2021: ALT 24 07/10/2021: BUN 15; Creatinine, Ser 1.08;  Potassium 5.1; Sodium 140  Recent Lipid Panel    Component Value Date/Time   CHOL 123 03/15/2021 1050   CHOL 160 03/22/2020 1005   TRIG 136 03/15/2021 1050   HDL 38 (L) 03/15/2021 1050   HDL 42 03/22/2020 1005   CHOLHDL 3.2 03/15/2021 1050   VLDL 27 03/15/2021 1050   LDLCALC 58 03/15/2021 1050   LDLCALC 96 03/22/2020 1005   LDLDIRECT 133 (H) 05/27/2018 1538    PHYSICAL EXAM:    VS:  BP 120/70 (BP Location: Left Arm, Patient Position: Sitting, Cuff Size: Normal)   Pulse 73   Ht 5\' 10"  (1.778 m)   Wt 202 lb 4 oz (91.7 kg)   SpO2 98%   BMI 29.02 kg/m   BMI: Body mass index is 29.02 kg/m.  Physical Exam Vitals reviewed.  Constitutional:      Appearance: He is well-developed.  HENT:     Head: Normocephalic and atraumatic.  Eyes:     General:        Right eye: No discharge.        Left eye: No discharge.  Neck:     Vascular: No JVD.  Cardiovascular:     Rate and Rhythm: Normal rate and regular rhythm.     Pulses:          Posterior tibial pulses are 2+ on the right side and 2+ on the left side.     Heart sounds: Normal heart sounds, S1 normal and S2 normal. Heart sounds not distant. No midsystolic click and no opening snap. No murmur heard.   No friction rub.  Pulmonary:     Effort: Pulmonary effort is normal. No respiratory distress.     Breath sounds: Normal breath sounds. No decreased breath sounds, wheezing or rales.  Chest:     Chest wall: No tenderness.  Abdominal:     General: There is no distension.     Palpations: Abdomen is soft.     Tenderness: There is no abdominal tenderness.  Musculoskeletal:     Cervical back: Normal range of motion.     Right lower leg:  No edema.     Left lower leg: No edema.  Skin:    General: Skin is warm and dry.     Nails: There is no clubbing.  Neurological:     Mental Status: He is alert and oriented to person, place, and time.  Psychiatric:        Speech: Speech normal.        Behavior: Behavior normal.        Thought  Content: Thought content normal.        Judgment: Judgment normal.    Wt Readings from Last 3 Encounters:  08/14/21 202 lb 4 oz (91.7 kg)  07/10/21 202 lb (91.6 kg)  01/12/21 204 lb (92.5 kg)     ASSESSMENT & PLAN:   CAD status post CABG without angina: He continues to do well without symptoms concerning for angina.  Continue current medical therapy including aspirin, carvedilol, atorvastatin, and ezetimibe.  Aggressive risk factor modification.  No indication for ischemic testing at this time.  However, if his cardiomyopathy persists on follow-up echo in several months time, despite escalation/optimization of GDMT, we will need to pursue diagnostic R/LHC.  HFrEF secondary to ICM status post ICD: He appears euvolemic and well compensated with NYHA class I symptoms.  Continue current GDMT including carvedilol, Entresto, spironolactone, and Jardiance.  Not requiring a standing loop diuretic.  Plan to update a limited echo in approximately 3 months time on maximally tolerated GDMT.  If his EF remains low at that time, we will pursue a diagnostic R/LHC.  With regards to his ICD, follow-up with EP as directed.  History of mitral valve repair: Stable on echo in 05/2021.  History of apical mural thrombus: No evidence of mural thrombus on follow-up echocardiograms.  Status post therapy with Coumadin.  No indication for further [place] at this time.  Carotid artery disease: Ultrasound from 05/2021 showed stable 1 to 39% bilateral ICA stenosis.  He remains on aspirin and atorvastatin.  HLD: LDL 58 in 02/2021.  Continue atorvastatin and ezetimibe.  History of tobacco use: He has quit smoking.  Arthralgias/myalgias: Gradually improving.  No exacerbation in symptoms following resumption of atorvastatin and ezetimibe.  Check CK.  Disposition: F/u with Dr. Fletcher Anon or an APP in 3 months and EP as directed.   Medication Adjustments/Labs and Tests Ordered: Current medicines are reviewed at length with the  patient today.  Concerns regarding medicines are outlined above. Medication changes, Labs and Tests ordered today are summarized above and listed in the Patient Instructions accessible in Encounters.   Signed, Christell Faith, PA-C 08/14/2021 9:43 AM     [facility] [address] [place] [address] [place], [place] [address] [phone]

## 2021-08-14 ENCOUNTER — Ambulatory Visit: Payer: Medicare HMO | Admitting: Physician Assistant

## 2021-08-14 ENCOUNTER — Other Ambulatory Visit: Payer: Self-pay

## 2021-08-14 VITALS — BP 120/70 | HR 73 | Ht 70.0 in | Wt 202.2 lb

## 2021-08-14 DIAGNOSIS — I5020 Unspecified systolic (congestive) heart failure: Secondary | ICD-10-CM

## 2021-08-14 DIAGNOSIS — Z95810 Presence of automatic (implantable) cardiac defibrillator: Secondary | ICD-10-CM | POA: Diagnosis not present

## 2021-08-14 DIAGNOSIS — Z87891 Personal history of nicotine dependence: Secondary | ICD-10-CM | POA: Diagnosis not present

## 2021-08-14 DIAGNOSIS — I2510 Atherosclerotic heart disease of native coronary artery without angina pectoris: Secondary | ICD-10-CM | POA: Diagnosis not present

## 2021-08-14 DIAGNOSIS — Z9889 Other specified postprocedural states: Secondary | ICD-10-CM | POA: Diagnosis not present

## 2021-08-14 DIAGNOSIS — I255 Ischemic cardiomyopathy: Secondary | ICD-10-CM | POA: Diagnosis not present

## 2021-08-14 DIAGNOSIS — E785 Hyperlipidemia, unspecified: Secondary | ICD-10-CM

## 2021-08-14 DIAGNOSIS — I6523 Occlusion and stenosis of bilateral carotid arteries: Secondary | ICD-10-CM | POA: Diagnosis not present

## 2021-08-14 DIAGNOSIS — Z951 Presence of aortocoronary bypass graft: Secondary | ICD-10-CM | POA: Diagnosis not present

## 2021-08-14 DIAGNOSIS — M2550 Pain in unspecified joint: Secondary | ICD-10-CM

## 2021-08-14 DIAGNOSIS — M791 Myalgia, unspecified site: Secondary | ICD-10-CM

## 2021-08-14 NOTE — Patient Instructions (Signed)
Medication Instructions:  No changes at this time.  *If you need a refill on your cardiac medications before your next appointment, please call your pharmacy*   Lab Work: CK today here in the office.  If you have labs (blood work) drawn today and your tests are completely normal, you will receive your results only by: [place] (if you have MyChart) OR A paper copy in the mail If you have any lab test that is abnormal or we need to change your treatment, we will call you to review the results.   Testing/Procedures: None   Follow-Up: At [facility], you and your health needs are our priority.  As part of our continuing mission to provide you with exceptional heart care, we have created designated Provider Care Teams.  These Care Teams include your primary Cardiologist (physician) and Advanced Practice Providers (APPs -  Physician Assistants and Nurse Practitioners) who all work together to provide you with the care you need, when you need it.  Your next appointment:   3 month(s)  The format for your next appointment:   In Person  Provider:   You may see Kathlyn Sacramento, MD or one of the following Advanced Practice Providers on your designated Care Team:   Murray Hodgkins, NP Christell Faith, PA-C Marrianne Mood, PA-C Cadence [place], [place]

## 2021-08-15 LAB — CK: Total CK: 98 U/L (ref 41–331)

## 2021-08-16 NOTE — Addendum Note (Signed)
Addended by: Britt Bottom on: 08/16/2021 10:09 AM   Modules accepted: Orders

## 2021-09-05 ENCOUNTER — Ambulatory Visit (INDEPENDENT_AMBULATORY_CARE_PROVIDER_SITE_OTHER): Payer: Medicare HMO

## 2021-09-05 DIAGNOSIS — I255 Ischemic cardiomyopathy: Secondary | ICD-10-CM | POA: Diagnosis not present

## 2021-09-05 LAB — CUP PACEART REMOTE DEVICE CHECK
Battery Remaining Longevity: 156 mo
Battery Remaining Percentage: 100 %
Brady Statistic RA Percent Paced: 0 %
Brady Statistic RV Percent Paced: 0 %
Date Time Interrogation Session: 20221115002300
HighPow Impedance: 46 Ohm
Implantable Lead Implant Date: 20101104
Implantable Lead Implant Date: 20101104
Implantable Lead Location: 753859
Implantable Lead Location: 753860
Implantable Lead Model: 158
Implantable Lead Model: 5076
Implantable Lead Serial Number: 301087
Implantable Pulse Generator Implant Date: 20210215
Lead Channel Impedance Value: 475 Ohm
Lead Channel Impedance Value: 496 Ohm
Lead Channel Setting Pacing Amplitude: 2 V
Lead Channel Setting Pacing Amplitude: 2.5 V
Lead Channel Setting Pacing Pulse Width: 0.4 ms
Lead Channel Setting Sensing Sensitivity: 0.5 mV
Pulse Gen Serial Number: 218699

## 2021-09-13 NOTE — Progress Notes (Signed)
Remote ICD transmission.   

## 2021-09-21 ENCOUNTER — Other Ambulatory Visit: Payer: Self-pay | Admitting: Physician Assistant

## 2021-09-21 MED ORDER — ATORVASTATIN CALCIUM 80 MG PO TABS: 80.0000 mg | ORAL_TABLET | Freq: Every day | ORAL | 1 refills | Status: DC

## 2021-11-14 ENCOUNTER — Ambulatory Visit: Payer: Medicare HMO | Admitting: Cardiovascular Disease

## 2021-12-05 ENCOUNTER — Ambulatory Visit (INDEPENDENT_AMBULATORY_CARE_PROVIDER_SITE_OTHER): Payer: Medicare HMO

## 2021-12-05 DIAGNOSIS — I255 Ischemic cardiomyopathy: Secondary | ICD-10-CM | POA: Diagnosis not present

## 2021-12-05 LAB — CUP PACEART REMOTE DEVICE CHECK
Battery Remaining Longevity: 150 mo
Battery Remaining Percentage: 100 %
Brady Statistic RA Percent Paced: 0 %
Brady Statistic RV Percent Paced: 0 %
Date Time Interrogation Session: 20230214002100
HighPow Impedance: 48 Ohm
Implantable Lead Implant Date: 20101104
Implantable Lead Implant Date: 20101104
Implantable Lead Location: 753859
Implantable Lead Location: 753860
Implantable Lead Model: 158
Implantable Lead Model: 5076
Implantable Lead Serial Number: 301087
Implantable Pulse Generator Implant Date: 20210215
Lead Channel Impedance Value: 462 Ohm
Lead Channel Impedance Value: 489 Ohm
Lead Channel Setting Pacing Amplitude: 2 V
Lead Channel Setting Pacing Amplitude: 2.5 V
Lead Channel Setting Pacing Pulse Width: 0.4 ms
Lead Channel Setting Sensing Sensitivity: 0.5 mV
Pulse Gen Serial Number: 218699

## 2021-12-11 NOTE — Progress Notes (Signed)
Remote ICD transmission.   

## 2021-12-12 ENCOUNTER — Ambulatory Visit: Payer: Medicare HMO | Admitting: Cardiovascular Disease

## 2022-01-05 NOTE — Progress Notes (Signed)
? ?Cardiology Office Note   ? ?Date:  01/08/2022  ? ?ID:  [name] [name], DOB January 10, 1961, MRN [id number] ? ?PCP:  Patient, No Pcp Per (Inactive)  ?Cardiologist:  Kathlyn Sacramento, MD  ?Electrophysiologist:  Virl Axe, MD  ? ?Chief Complaint: Follow up ? ?History of Present Illness:  ? ?[name] [name] is a 61 y.o. male with history of CAD status post two-vessel CABG and mitral valve repair in 2010, HFrEF secondary to ICM status post ICD for primary prevention with generator change out in 11/2019, apical mural thrombus status post Coumadin, carotid artery disease, HLD, and tobacco use who presents for follow-up of CAD and cardiomyopathy. ?  ?He underwent two-vessel CABG with LIMA to LAD and RIMA to RCA along with mitral valve repair in 2010.  Prior EF noted to be 20% with apical mural thrombus.  He underwent ICD implantation as part of the MADIT-RIT trial for primary prevention.  Echo in 01/2016 showed an improved EF of 40 to 45%, anterior and anteroseptal hypokinesis, mild aortic insufficiency, intact mitral valve repair, no significant pulmonary hypertension, and no evidence of apical mural thrombus.  Carotid artery ultrasound from 04/2017 demonstrated no significant carotid artery stenosis along the right side with 50 to 60% stenosis along the left internal carotid artery.  Follow-up carotid artery ultrasound in 05/2018 showed improved bilateral ICA stenosis of 1 to 39%.  Repeat echo, to evaluate his cardiomyopathy, in 05/2020, showed an EF of 35 to 40%, mildly dilated internal LV cavity size, mild LVH, grade 2 diastolic dysfunction, akinesis of the apical anteroseptal wall, anterior wall, and inferior wall, normal RV systolic function and ventricular cavity size, mildly elevated PASP at 40 mmHg, mildly dilated left atrium, prior repair of the mitral valve with mild regurgitation and no evidence of stenosis, and mild to moderate aortic valve insufficiency.  Given a slight reduction in his cardiomyopathy on this most  recent echo, he was transitioned from ACE inhibitor to [place]. ?  ?He was seen in the office in 12/2020 and was doing well from a cardiac perspective.  He had ran out of his medications approximately 1 week prior.  Updated echo in 05/2021 showed an EF of 30 to 35%, mildly reduced RV systolic function with normal ventricular cavity size, normal PASP, mildly to moderately dilated left atrium, prior repair of the mitral valve with mild regurgitation and no evidence of stenosis, mild to moderate aortic valve insufficiency, and an estimated right atrial pressure of 3 mmHg.  Given slight reduction in LV systolic function, he was initiated on Farxiga 10 mg daily.  Carotid artery ultrasound in 05/2021 showed a stable 1 to 39% bilateral ICA stenosis with bilateral antegrade flow of the vertebral arteries and normal flow hemodynamics in the bilateral subclavian arteries. ?  ?He was seen on 07/10/2021 and was doing well from a cardiac perspective.  It was noted he had been unable to fill SGLT2i, though was subsequently started on this after contacting his pharmacy.  He reported an approximate year-long history of myalgias and arthralgias, particularly involving the lower extremities.  Given this, we underwent a trial of holding atorvastatin and ezetimibe.  With this, he only noted a minor/minimal improvement in his lower extremity discomfort, and reported when this discomfort initially began in September 2021 it was "quite severe."  He was advised to resume atorvastatin and ezetimibe. ? ?He was last seen in the office on 05/14/2021 and was doing well from a cardiac perspective without symptoms of angina or decompensation.  He  had resumed atorvastatin and ezetimibe without significant worsening in his hip discomfort. ? ?He comes in doing well from a cardiac perspective.  No symptoms of angina, dyspnea, palpitations, dizziness, presyncope, or syncope.  No lower extremity swelling or orthopnea.  He remains very active at baseline.   He has been without Jardiance or Entresto for the past several months secondary to copayment at the pharmacy.  However, he was able to get his copayment reduced and he plans on picking these medications up once they are available from the pharmacy.  He does continue to note hip discomfort, though feels like this is improving. ? ? ?Labs independently reviewed: ?06/2021 - BUN 15, SCr 1.08, potassium 5.1 ?02/2021 - albumin 4.1, AST/ALT normal, TC 123, TG 136, HDL 38, LDL 58 ?11/2019 - Hgb 15.4, PLT 304 ? ?Past Medical History:  ?Diagnosis Date  ? Atrial fibrillation ([place])   ? a. s/p CABG  ? Cardiac defibrillator in situ   ? MADIT RIT trial  ? Coronary artery disease   ? a. s/p 2-V CABG (LIMA-LAD, RIMA-RCA) 2010  ? Hyperlipidemia   ? Ischemic cardiomyopathy   ? a. prior EF 20%; b. TTE 4/17: EF 40-45%, ant/antoseptal HK, mild AI, intact mitral valve repair, no sig pulm htn, no evidence of apical thrombus   ? Mitral valve disease   ? a. s/p repair 2010 at time of bypass  ? Post-infarction apical thrombus ([place])   ? a. s/p Coumadin  ? ? ?Past Surgical History:  ?Procedure Laterality Date  ? CARDIAC DEFIBRILLATOR PLACEMENT    ? [employer]  ? COLONOSCOPY WITH PROPOFOL N/A 07/31/2016  ? Procedure: COLONOSCOPY WITH PROPOFOL;  Surgeon: Lollie Sails, MD;  Location: [facility] ENDOSCOPY;  Service: Endoscopy;  Laterality: N/A;  ? CORONARY ARTERY BYPASS GRAFT  2010  ? LIMA to LAD, RIMA to RCA and mitral valve repair  ? ICD GENERATOR CHANGEOUT N/A 12/07/2019  ? Procedure: ICD GENERATOR CHANGEOUT;  Surgeon: Deboraha Sprang, MD;  Location: [place] CV LAB;  Service: Cardiovascular;  Laterality: N/A;  ? KNEE SURGERY    ? MITRAL VALVE REPAIR  2010  ? ? ?Current Medications: ?Current Meds  ?Medication Sig  ? amoxicillin (AMOXIL) 500 MG tablet Take 1 tablet (500 mg total) by mouth See admin instructions. Take 4 tablets one hour before dental procedure.  ? aspirin EC 81 MG tablet Take 1 tablet (81 mg total) by mouth daily.  ?  nitroGLYCERIN (NITROSTAT) 0.4 MG SL tablet PLACE 1 TABLET (0.4 MG TOTAL) UNDER THE TONGUE EVERY 5 (FIVE) MINUTES AS NEEDED.  ? Omega-3 Fatty Acids (FISH OIL) 1200 MG CAPS Take 2,400 mg by mouth daily.   ? spironolactone (ALDACTONE) 25 MG tablet TAKE 1 TABLET EVERY DAY  ? [DISCONTINUED] atorvastatin (LIPITOR) 80 MG tablet Take 1 tablet (80 mg total) by mouth daily.  ? [DISCONTINUED] carvedilol (COREG) 25 MG tablet Take 1 tablet (25 mg total) by mouth 2 (two) times daily with a meal.  ? [DISCONTINUED] empagliflozin (JARDIANCE) 10 MG TABS tablet Take 1 tablet (10 mg total) by mouth daily.  ? [DISCONTINUED] ezetimibe (ZETIA) 10 MG tablet TAKE 1 TABLET EVERY DAY  ? [DISCONTINUED] sacubitril-valsartan (ENTRESTO) 24-26 MG Take 1 tablet by mouth 2 (two) times daily.  ? ? ?Allergies:   Patient has no known allergies.  ? ?Social History  ? ?Socioeconomic History  ? Marital status: Widowed  ?  Spouse name: Not on file  ? Number of children: Not on file  ? Years of  education: Not on file  ? Highest education level: Not on file  ?Occupational History  ? Not on file  ?Tobacco Use  ? Smoking status: Former  ?  Packs/day: 1.00  ?  Years: 37.00  ?  Pack years: 37.00  ?  Types: Cigarettes  ?  Quit date: 05/02/2009  ?  Years since quitting: 12.6  ? Smokeless tobacco: Never  ?Vaping Use  ? Vaping Use: Never used  ?Substance and Sexual Activity  ? Alcohol use: No  ?  Comment: ocassionally  ? Drug use: No  ? Sexual activity: Not on file  ?Other Topics Concern  ? Not on file  ?Social History Narrative  ? Not on file  ? ?Social Determinants of Health  ? ?Financial Resource Strain: Not on file  ?Food Insecurity: Not on file  ?Transportation Needs: Not on file  ?Physical Activity: Not on file  ?Stress: Not on file  ?Social Connections: Not on file  ?  ? ?Family History:  ?The patient's family history includes Heart attack in his mother; Heart attack (age of onset: 20) in his father; Heart failure in his mother; Hyperlipidemia in an other  family member. ? ?ROS:   ?Review of Systems  ?Constitutional:  Negative for chills, diaphoresis, fever, malaise/fatigue and weight loss.  ?HENT:  Negative for congestion.   ?Eyes:  Negative for discharge and redness.

## 2022-01-08 ENCOUNTER — Encounter: Payer: Self-pay | Admitting: Physician Assistant

## 2022-01-08 ENCOUNTER — Ambulatory Visit: Payer: Medicare HMO | Admitting: Physician Assistant

## 2022-01-08 ENCOUNTER — Other Ambulatory Visit: Payer: Self-pay

## 2022-01-08 ENCOUNTER — Telehealth: Payer: Self-pay | Admitting: Physician Assistant

## 2022-01-08 VITALS — BP 114/70 | HR 74 | Ht 71.0 in | Wt 206.0 lb

## 2022-01-08 DIAGNOSIS — I5020 Unspecified systolic (congestive) heart failure: Secondary | ICD-10-CM

## 2022-01-08 DIAGNOSIS — Z95810 Presence of automatic (implantable) cardiac defibrillator: Secondary | ICD-10-CM | POA: Diagnosis not present

## 2022-01-08 DIAGNOSIS — I6523 Occlusion and stenosis of bilateral carotid arteries: Secondary | ICD-10-CM

## 2022-01-08 DIAGNOSIS — Z87891 Personal history of nicotine dependence: Secondary | ICD-10-CM

## 2022-01-08 DIAGNOSIS — E785 Hyperlipidemia, unspecified: Secondary | ICD-10-CM | POA: Insufficient documentation

## 2022-01-08 DIAGNOSIS — Z951 Presence of aortocoronary bypass graft: Secondary | ICD-10-CM | POA: Insufficient documentation

## 2022-01-08 DIAGNOSIS — I255 Ischemic cardiomyopathy: Secondary | ICD-10-CM | POA: Diagnosis not present

## 2022-01-08 DIAGNOSIS — I2510 Atherosclerotic heart disease of native coronary artery without angina pectoris: Secondary | ICD-10-CM | POA: Diagnosis not present

## 2022-01-08 DIAGNOSIS — Z9889 Other specified postprocedural states: Secondary | ICD-10-CM | POA: Diagnosis not present

## 2022-01-08 MED ORDER — ENTRESTO 24-26 MG PO TABS: 1.0000 | ORAL_TABLET | Freq: Two times a day (BID) | ORAL | 3 refills | Status: AC

## 2022-01-08 MED ORDER — ENTRESTO 24-26 MG PO TABS: 1.0000 | ORAL_TABLET | Freq: Two times a day (BID) | ORAL | 3 refills | Status: DC

## 2022-01-08 MED ORDER — CARVEDILOL 25 MG PO TABS: 25.0000 mg | ORAL_TABLET | Freq: Two times a day (BID) | ORAL | 3 refills | Status: DC

## 2022-01-08 MED ORDER — ATORVASTATIN CALCIUM 80 MG PO TABS: 80.0000 mg | ORAL_TABLET | Freq: Every day | ORAL | 3 refills | Status: DC

## 2022-01-08 MED ORDER — EMPAGLIFLOZIN 10 MG PO TABS: 10.0000 mg | ORAL_TABLET | Freq: Every day | ORAL | 3 refills | Status: DC

## 2022-01-08 MED ORDER — EZETIMIBE 10 MG PO TABS: 10.0000 mg | ORAL_TABLET | Freq: Every day | ORAL | 3 refills | Status: DC

## 2022-01-08 NOTE — Telephone Encounter (Signed)
Patient assistance applications completed for both Entresto & Jardiance. Patient aware to bring in income documentation and will then fax to companies.  ?

## 2022-01-08 NOTE — Patient Instructions (Addendum)
With [employer] you must call and request tier exception. They will then fax [place] the form to complete for that. Please provide them fax number of [phone].  ? ?Medication Instructions:  ?No changes at this time.  ? ?*If you need a refill on your cardiac medications before your next appointment, please call your pharmacy* ? ? ?Lab Work: ?BMET in one week after starting Jardiance & Entresto. ? ?If you have labs (blood work) drawn today and your tests are completely normal, you will receive your results only by: ?MyChart Message (if you have MyChart) OR ?A paper copy in the mail ?If you have any lab test that is abnormal or we need to change your treatment, we will call you to review the results. ? ? ?Testing/Procedures: ?Your physician has requested that you have an echocardiogram in 3 months.  ? ?Echocardiography is a painless test that uses sound waves to create images of your heart. It provides your doctor with information about the size and shape of your heart and how well your heart?s chambers and valves are working. This procedure takes approximately one hour. There are no restrictions for this procedure. ? ? ? ?Follow-Up: ?At [facility], you and your health needs are our priority.  As part of our continuing mission to provide you with exceptional heart care, we have created designated Provider Care Teams.  These Care Teams include your primary Cardiologist (physician) and Advanced Practice Providers (APPs -  Physician Assistants and Nurse Practitioners) who all work together to provide you with the care you need, when you need it. ? ? ?Your next appointment:   ?4 month(s) ? ?The format for your next appointment:   ?In Person ? ?Provider:   ?Kathlyn Sacramento, MD or Christell Faith, PA-C  ? ?

## 2022-01-09 ENCOUNTER — Telehealth: Payer: Self-pay | Admitting: Physician Assistant

## 2022-01-09 NOTE — Telephone Encounter (Signed)
Please call regarding prior authorization for Entresto. Reference #[id number]

## 2022-01-09 NOTE — Telephone Encounter (Signed)
Returned call ?Spoke with Delsa Sale ?They will be faxing over a tier exception for the patient.  ?

## 2022-01-11 NOTE — Telephone Encounter (Signed)
For patients Jardiance letter received with approved to receive this medication from 01/09/22 to 10/21/22 from the company. ? ?[place] exception forms completed and faxed back to [facility] ?

## 2022-01-17 NOTE — Telephone Encounter (Signed)
Tier exception forms denied by patients insurance. Filed denial information in samples closet. Patient has completed patient assistance application and this is pending as well.  ?

## 2022-01-25 ENCOUNTER — Other Ambulatory Visit: Payer: Self-pay

## 2022-01-25 DIAGNOSIS — Z79899 Other long term (current) drug therapy: Secondary | ICD-10-CM

## 2022-01-25 NOTE — Progress Notes (Signed)
Lab orders placed.  

## 2022-02-02 ENCOUNTER — Other Ambulatory Visit: Payer: Self-pay | Admitting: Emergency Medicine

## 2022-02-02 ENCOUNTER — Other Ambulatory Visit (INDEPENDENT_AMBULATORY_CARE_PROVIDER_SITE_OTHER): Payer: Medicare HMO

## 2022-02-02 DIAGNOSIS — Z79899 Other long term (current) drug therapy: Secondary | ICD-10-CM

## 2022-02-03 LAB — BASIC METABOLIC PANEL
BUN/Creatinine Ratio: 14 (ref 10–24)
BUN: 16 mg/dL (ref 8–27)
CO2: 20 mmol/L (ref 20–29)
Calcium: 9.4 mg/dL (ref 8.6–10.2)
Chloride: 104 mmol/L (ref 96–106)
Creatinine, Ser: 1.14 mg/dL (ref 0.76–1.27)
Glucose: 96 mg/dL (ref 70–99)
Potassium: 4.9 mmol/L (ref 3.5–5.2)
Sodium: 138 mmol/L (ref 134–144)
eGFR: 72 mL/min/{1.73_m2} (ref >59)

## 2022-03-06 ENCOUNTER — Ambulatory Visit (INDEPENDENT_AMBULATORY_CARE_PROVIDER_SITE_OTHER): Payer: Medicare HMO

## 2022-03-06 DIAGNOSIS — I255 Ischemic cardiomyopathy: Secondary | ICD-10-CM

## 2022-03-06 LAB — CUP PACEART REMOTE DEVICE CHECK
Battery Remaining Longevity: 150 mo
Battery Remaining Percentage: 100 %
Brady Statistic RA Percent Paced: 0 %
Brady Statistic RV Percent Paced: 0 %
Date Time Interrogation Session: 20230516002100
HighPow Impedance: 48 Ohm
Implantable Lead Implant Date: 20101104
Implantable Lead Implant Date: 20101104
Implantable Lead Location: 753859
Implantable Lead Location: 753860
Implantable Lead Model: 158
Implantable Lead Model: 5076
Implantable Lead Serial Number: 301087
Implantable Pulse Generator Implant Date: 20210215
Lead Channel Impedance Value: 478 Ohm
Lead Channel Impedance Value: 529 Ohm
Lead Channel Setting Pacing Amplitude: 2 V
Lead Channel Setting Pacing Amplitude: 2.5 V
Lead Channel Setting Pacing Pulse Width: 0.4 ms
Lead Channel Setting Sensing Sensitivity: 0.5 mV
Pulse Gen Serial Number: 218699

## 2022-03-22 NOTE — Progress Notes (Signed)
Remote ICD transmission.   

## 2022-04-03 ENCOUNTER — Ambulatory Visit (INDEPENDENT_AMBULATORY_CARE_PROVIDER_SITE_OTHER): Payer: Medicare HMO

## 2022-04-03 DIAGNOSIS — I255 Ischemic cardiomyopathy: Secondary | ICD-10-CM | POA: Diagnosis not present

## 2022-04-03 LAB — ECHOCARDIOGRAM COMPLETE
AR max vel: 3.14 cm2
AV Area VTI: 3.16 cm2
AV Area mean vel: 3.01 cm2
AV Mean grad: 2 mmHg
AV Peak grad: 4.2 mmHg
Ao pk vel: 1.03 m/s
Area-P 1/2: 3.66 cm2
MV VTI: 1.56 cm2
P 1/2 time: 419 msec
S' Lateral: 4.6 cm

## 2022-04-03 MED ORDER — PERFLUTREN LIPID MICROSPHERE
1.0000 mL | INTRAVENOUS | Status: AC | PRN
  Administered 2022-04-03: 2 mL via INTRAVENOUS

## 2022-04-14 ENCOUNTER — Other Ambulatory Visit: Payer: Self-pay | Admitting: Physician Assistant

## 2022-05-07 NOTE — Progress Notes (Signed)
Cardiology Office Note    Date:  05/10/2022   ID:  [name] [name], DOB [birth year], MRN [id number]  PCP:  Patient, No Pcp Per  Cardiologist:  Kathlyn Sacramento, MD  Electrophysiologist:  Virl Axe, MD   Chief Complaint: Follow-up  History of Present Illness:   [name] [name] is a 61 y.o. male with history of CAD status post two-vessel CABG and mitral valve repair in 2010, HFrEF secondary to ICM status post ICD for primary prevention with generator change out in 11/2019, prior apical mural thrombus status post Coumadin, carotid artery disease, HLD, and tobacco use who presents for follow-up of CAD and cardiomyopathy.   He underwent two-vessel CABG with LIMA to LAD and RIMA to RCA along with mitral valve repair in 2010.  Prior EF noted to be 20% with apical mural thrombus.  He underwent ICD implantation as part of the MADIT-RIT trial for primary prevention.  Echo in 01/2016 showed an improved EF of 40 to 45%, anterior and anteroseptal hypokinesis, mild aortic insufficiency, intact mitral valve repair, no significant pulmonary hypertension, and no evidence of apical mural thrombus.  Carotid artery ultrasound from 04/2017 demonstrated no significant carotid artery stenosis along the right side with 50 to 60% stenosis along the left internal carotid artery.  Follow-up carotid artery ultrasound in 05/2018 showed improved bilateral ICA stenosis of 1 to 39%.  Repeat echo, to evaluate his cardiomyopathy, in 05/2020, showed an EF of 35 to 40%, mildly dilated internal LV cavity size, mild LVH, grade 2 diastolic dysfunction, akinesis of the apical anteroseptal wall, anterior wall, and inferior wall, normal RV systolic function and ventricular cavity size, mildly elevated PASP at 40 mmHg, mildly dilated left atrium, prior repair of the mitral valve with mild regurgitation and no evidence of stenosis, and mild to moderate aortic valve insufficiency.  Given a slight reduction in his cardiomyopathy on this most recent  echo, he was transitioned from ACE inhibitor to [place].   He was seen in the office in 12/2020 and was doing well from a cardiac perspective.  He had ran out of his medications approximately 1 week prior.  Updated echo in 05/2021 showed an EF of 30 to 35%, mildly reduced RV systolic function with normal ventricular cavity size, normal PASP, mildly to moderately dilated left atrium, prior repair of the mitral valve with mild regurgitation and no evidence of stenosis, mild to moderate aortic valve insufficiency, and an estimated right atrial pressure of 3 mmHg.  Given slight reduction in LV systolic function, he was initiated on Farxiga 10 mg daily.  Carotid artery ultrasound in 05/2021 showed a stable 1 to 39% bilateral ICA stenosis with bilateral antegrade flow of the vertebral arteries and normal flow hemodynamics in the bilateral subclavian arteries.   He was seen on 07/10/2021 and was doing well from a cardiac perspective.  It was noted he had been unable to fill SGLT2i, though was subsequently started on this after contacting his pharmacy.  He reported an approximate year-long history of myalgias and arthralgias, particularly involving the lower extremities.  Given this, we underwent a trial of holding atorvastatin and ezetimibe.  With this, he only noted a minor/minimal improvement in his lower extremity discomfort, and reported when this discomfort initially began in September 2021 it was "quite severe."  He was advised to resume atorvastatin and ezetimibe.   He was seen in the office on 08/14/2021 and was doing well from a cardiac perspective without symptoms of angina or decompensation.  He had  resumed atorvastatin and ezetimibe without significant worsening in his hip discomfort.  He was last seen in the office on 01/08/2022 and was doing well, without symptoms of angina or decompensation.  He remained very active at baseline.  He had been without Jardiance and Entresto for several months leading up to  that visit due to copayment at the pharmacy, with plans to resume them after his visit with [place] following a reduction in his copayment.  Following resumption of maximally tolerated GDMT, he underwent echo on 04/03/2022 which demonstrated a persistent cardiomyopathy with an EF of 30 to 35%, hypokinesis of the mid to distal anterior and septal wall with akinetic and aneurysmal appearing periapical region, grade 2 diastolic dysfunction, no evidence of LV thrombus, mildly reduced RV systolic function with mildly enlarged ventricular cavity size, moderately dilated left atrium, prior repair of the mitral valve with mild regurgitation, and mild aortic insufficiency.  He comes in doing very well from a cardiac perspective, and is without symptoms of angina or decompensation.  He had only been back on his GDMT for 3-1/2 to 4 weeks prior to undergoing the above echo.  He also notes over the past several weeks he has noted a significant improvement in his myalgias/arthralgias.  With this, he has a sedentary and notes an improvement in his overall quality.  Because he is being more active, he has gradually noted some mild weight loss.  No chest pain, dyspnea, palpitations, dizziness, presyncope, syncope, or lower extremity swelling.  Overall, he feels like he is doing quite well, and does not have any active issues or concerns at this time.   Labs independently reviewed: 01/2022 - BUN 16, serum creatinine 1.14, potassium 4.9 02/2021 - albumin 4.1, AST/ALT normal, TC 123, TG 136, HDL 38, LDL 58 11/2019 - Hgb 15.4, PLT 304  Past Medical History:  Diagnosis Date   Atrial fibrillation ([place])    a. s/p CABG   Cardiac defibrillator in situ    MADIT RIT trial   Coronary artery disease    a. s/p 2-V CABG (LIMA-LAD, RIMA-RCA) 2010   Hyperlipidemia    Ischemic cardiomyopathy    a. prior EF 20%; b. TTE 4/17: EF 40-45%, ant/antoseptal HK, mild AI, intact mitral valve repair, no sig pulm htn, no evidence of apical thrombus     Mitral valve disease    a. s/p repair 2010 at time of bypass   Post-infarction apical thrombus ([place])    a. s/p Coumadin    Past Surgical History:  Procedure Laterality Date   CARDIAC DEFIBRILLATOR PLACEMENT     [facility] [facility]   COLONOSCOPY WITH PROPOFOL N/A 07/31/2016   Procedure: COLONOSCOPY WITH PROPOFOL;  Surgeon: Lollie Sails, MD;  Location: [facility] ENDOSCOPY;  Service: Endoscopy;  Laterality: N/A;   CORONARY ARTERY BYPASS GRAFT  2010   LIMA to LAD, RIMA to RCA and mitral valve repair   ICD GENERATOR CHANGEOUT N/A 12/07/2019   Procedure: ICD GENERATOR CHANGEOUT;  Surgeon: Deboraha Sprang, MD;  Location: [facility] [place];  Service: Cardiovascular;  Laterality: N/A;   KNEE SURGERY     MITRAL VALVE REPAIR  2010    Current Medications: Current Meds  Medication Sig   amoxicillin (AMOXIL) 500 MG tablet Take 1 tablet (500 mg total) by mouth See admin instructions. Take 4 tablets one hour before dental procedure.   aspirin EC 81 MG tablet Take 1 tablet (81 mg total) by mouth daily.   atorvastatin (LIPITOR) 80 MG tablet Take 1 tablet (80  mg total) by mouth daily.   carvedilol (COREG) 25 MG tablet Take 1 tablet (25 mg total) by mouth 2 (two) times daily with a meal.   empagliflozin (JARDIANCE) 10 MG TABS tablet Take 1 tablet (10 mg total) by mouth daily.   ezetimibe (ZETIA) 10 MG tablet Take 1 tablet (10 mg total) by mouth daily.   nitroGLYCERIN (NITROSTAT) 0.4 MG SL tablet PLACE 1 TABLET (0.4 MG TOTAL) UNDER THE TONGUE EVERY 5 (FIVE) MINUTES AS NEEDED.   Omega-3 Fatty Acids (FISH OIL) 1200 MG CAPS Take 2,400 mg by mouth daily.    sacubitril-valsartan (ENTRESTO) 24-26 MG Take 1 tablet by mouth 2 (two) times daily.   spironolactone (ALDACTONE) 25 MG tablet TAKE 1 TABLET EVERY DAY    Allergies:   Patient has no known allergies.   Social History   Socioeconomic History   Marital status: Widowed    Spouse name: Not on file   Number of children: Not on file   Years of  education: Not on file   Highest education level: Not on file  Occupational History   Not on file  Tobacco Use   Smoking status: Former    Packs/day: 1.00    Years: 37.00    Total pack years: 37.00    Types: Cigarettes    Quit date: 05/02/2009    Years since quitting: 13.0   Smokeless tobacco: Never  Vaping Use   Vaping Use: Never used  Substance and Sexual Activity   Alcohol use: No    Comment: ocassionally   Drug use: No   Sexual activity: Not on file  Other Topics Concern   Not on file  Social History Narrative   Not on file   Social Determinants of Health   Financial Resource Strain: Not on file  Food Insecurity: Not on file  Transportation Needs: Not on file  Physical Activity: Not on file  Stress: Not on file  Social Connections: Not on file     Family History:  The patient's family history includes Heart attack in his mother; Heart attack (age of onset: 95) in his father; Heart failure in his mother; Hyperlipidemia in an other family member.  ROS:   12-point review of systems is negative unless otherwise noted in the HPI.   EKGs/Labs/Other Studies Reviewed:    Studies reviewed were summarized above. The additional studies were reviewed today:  2D echo 04/03/2022: 1. Left ventricular ejection fraction, by estimation, is 30 to 35%. The  left ventricle has moderately decreased function. The left ventricle  demonstrates regional wall motion abnormalities (Hypokinesis of teh mid to  distal anterior and septal wall with  akinetic and aneurysmal appearing periapical region). The left ventricular  internal cavity size was mildly dilated. Left ventricular diastolic  parameters are consistent with Grade II diastolic dysfunction  (pseudonormalization).No LV thrombus noted.   2. Right ventricular systolic function is mildly reduced. The right  ventricular size is mildly enlarged.   3. Left atrial size was moderately dilated.   4. The mitral valve has been  repaired/replaced. Mild mitral valve  regurgitation. Moderate mitral annular calcification. There is a  prosthetic annuloplasty ring present in the mitral position. Procedure  Date: 2010.   5. The aortic valve was not well visualized. Aortic valve regurgitation  is mild. __________  2D echo 05/2021:  1. Left ventricular ejection fraction, by estimation, is 30 to 35%. The  left ventricle has moderately decreased function. The left ventricle  demonstrates regional wall motion abnormalities (see  scoring  diagram/findings for description). Left ventricular   diastolic function could not be evaluated.   2. Right ventricular systolic function is mildly reduced. The right  ventricular size is normal. There is normal pulmonary artery systolic  pressure.   3. Left atrial size was mild to moderately dilated.   4. The mitral valve has been repaired/replaced. Mild mitral valve  regurgitation. No evidence of mitral stenosis. There is a prosthetic  annuloplasty ring present in the mitral position. Procedure Date: 2010.   5. The aortic valve is tricuspid. Aortic valve regurgitation is mild to  moderate. No aortic stenosis is present.   6. The inferior vena cava is normal in size with greater than 50%  respiratory variability, suggesting right atrial pressure of 3 mmHg. __________   2D echo 05/2020: 1. Left ventricular ejection fraction, by estimation, is 35 to 40%. The  left ventricle has moderately decreased function. The left ventricle  demonstrates regional wall motion abnormalities (see scoring  diagram/findings for description). The left  ventricular internal cavity size was mildly dilated. There is mild left  ventricular hypertrophy. Left ventricular diastolic parameters are  consistent with Grade II diastolic dysfunction (pseudonormalization).  There is akinesis of the left ventricular,  apical anteroseptal wall, anterior wall and inferior wall.   2. Right ventricular systolic function is  normal. The right ventricular  size is normal. There is mildly elevated pulmonary artery systolic  pressure. The estimated right ventricular systolic pressure is 84.6 mmHg.   3. Left atrial size was mildly dilated.   4. The mitral valve has been repaired/replaced. Mild mitral valve  regurgitation. No evidence of mitral stenosis.   5. The aortic valve is normal in structure. Aortic valve regurgitation is  mild to moderate. No aortic stenosis is present.   6. The inferior vena cava is normal in size with greater than 50%  respiratory variability, suggesting right atrial pressure of 3 mmHg.   Comparison(s): Most recent echo in 2017 showed an EF of 40-45%. __________   2D echo 01/2016: - Left ventricle: The cavity size was normal. Systolic function was    mildly to moderately reduced. The estimated ejection fraction was    in the range of 40% to 45%. Hypokinesis of the anterior,    anteroseptal and apical myocardium. Left ventricular diastolic    function parameters were normal.  - Aortic valve: There was mild regurgitation.  - Mitral valve: A mitral valve repair was present.  - Left atrium: The atrium was moderately dilated.  - Right ventricle: Pacer wire or catheter noted in right ventricle.    Systolic function was normal.  - Pulmonary arteries: Systolic pressure was within the normal    range.   Impressions:   - no apical thrombus noted.     EKG:  EKG is ordered today.  The EKG ordered today demonstrates NSR, 61 bpm, prior inferior infarct, anterolateral ST-T changes, no significant change compared to prior tracing  Recent Labs: 02/02/2022: BUN 16; Creatinine, Ser 1.14; Potassium 4.9; Sodium 138  Recent Lipid Panel    Component Value Date/Time   CHOL 123 03/15/2021 1050   CHOL 160 03/22/2020 1005   TRIG 136 03/15/2021 1050   HDL 38 (L) 03/15/2021 1050   HDL 42 03/22/2020 1005   CHOLHDL 3.2 03/15/2021 1050   VLDL 27 03/15/2021 1050   LDLCALC 58 03/15/2021 1050   LDLCALC  96 03/22/2020 1005   LDLDIRECT 133 (H) 05/27/2018 1538    PHYSICAL EXAM:    VS:  BP 116/66   Pulse 61   Ht '5\' 10"'$  (1.778 m)   Wt 202 lb 3.2 oz (91.7 kg)   BMI 29.01 kg/m   BMI: Body mass index is 29.01 kg/m.  Physical Exam Vitals reviewed.  Constitutional:      Appearance: He is well-developed.  HENT:     Head: Normocephalic and atraumatic.  Eyes:     General:        Right eye: No discharge.        Left eye: No discharge.  Neck:     Vascular: No JVD.  Cardiovascular:     Rate and Rhythm: Normal rate and regular rhythm.     Heart sounds: Normal heart sounds, S1 normal and S2 normal. Heart sounds not distant. No midsystolic click and no opening snap. No murmur heard.    No friction rub.  Pulmonary:     Effort: Pulmonary effort is normal. No respiratory distress.     Breath sounds: Normal breath sounds. No decreased breath sounds, wheezing or rales.  Chest:     Chest wall: No tenderness.  Abdominal:     General: There is no distension.  Musculoskeletal:     Cervical back: Normal range of motion.  Skin:    General: Skin is warm and dry.     Nails: There is no clubbing.  Neurological:     Mental Status: He is alert and oriented to person, place, and time.  Psychiatric:        Speech: Speech normal.        Behavior: Behavior normal.        Thought Content: Thought content normal.        Judgment: Judgment normal.     Wt Readings from Last 3 Encounters:  05/10/22 202 lb 3.2 oz (91.7 kg)  01/08/22 206 lb (93.4 kg)  08/14/21 202 lb 4 oz (91.7 kg)     ASSESSMENT & PLAN:   CAD status post CABG without angina: He continues to do very well without symptoms concerning for angina.  Continue current medical therapy including aspirin, carvedilol, atorvastatin, and ezetimibe.  Aggressive risk factor modification.  Pending echo in 6 months, we may need to pursue further ischemic evaluation.  HFrEF secondary to ICM status post ICD: He appears euvolemic and well compensated  with NYHA class I symptoms.  His echo obtained in 03/2022 was performed after he had only been back on GDMT for 3-1/2 to 4 weeks.  He is also more active now with noted improvement in myalgias and arthralgias.  We did discuss moving forward with diagnostic R/LHC at this time given persistent cardiomyopathy, though given noted improvement in functional status, and in the context of him having only had recently resumed GDMT prior to echo, we will defer this at this time.  We will pursue a limited echo to evaluate LV systolic function in 6 months while he has been maintained on maximally tolerated GDMT.  If his cardiomyopathy persists at that time we will then pursue [facility].  He remains on carvedilol, Jardiance, Entresto, and spironolactone.  Blood pressure precludes titration of evidence-based medical therapy at this time.  Recent labs showed stable renal function and electrolytes.  With regards to his ICD, most recent device interrogation showed normal function.  Follow-up with EP as directed.  History of mitral valve repair: Stable on most recent echo.  History of apical mural thrombus: Status post therapy with Coumadin.  No evidence of mural thrombus on follow-up echocardiograms.  Carotid artery  disease: Ultrasound in 05/2021 showed stable 1 to 39% bilateral ICA stenosis.  He remains on aspirin and atorvastatin.  HLD: LDL 58.  He remains on atorvastatin and ezetimibe.  History of tobacco use: Previously quit smoking.   Disposition: F/u with Dr. Fletcher Anon or an APP in 64-month and EP as directed.   Medication Adjustments/Labs and Tests Ordered: Current medicines are reviewed at length with the patient today.  Concerns regarding medicines are outlined above. Medication changes, Labs and Tests ordered today are summarized above and listed in the Patient Instructions accessible in Encounters.   Signed, RChristell Faith PA-C 05/10/2022 9:17 AM     [email or web address] [phone]

## 2022-05-10 ENCOUNTER — Ambulatory Visit: Payer: Medicare HMO | Admitting: Physician Assistant

## 2022-05-10 ENCOUNTER — Encounter: Payer: Self-pay | Admitting: Physician Assistant

## 2022-05-10 VITALS — BP 116/66 | HR 61 | Ht 70.0 in | Wt 202.2 lb

## 2022-05-10 DIAGNOSIS — I5020 Unspecified systolic (congestive) heart failure: Secondary | ICD-10-CM | POA: Diagnosis not present

## 2022-05-10 DIAGNOSIS — E785 Hyperlipidemia, unspecified: Secondary | ICD-10-CM | POA: Diagnosis not present

## 2022-05-10 DIAGNOSIS — Z95810 Presence of automatic (implantable) cardiac defibrillator: Secondary | ICD-10-CM

## 2022-05-10 DIAGNOSIS — I2510 Atherosclerotic heart disease of native coronary artery without angina pectoris: Secondary | ICD-10-CM

## 2022-05-10 DIAGNOSIS — Z87891 Personal history of nicotine dependence: Secondary | ICD-10-CM

## 2022-05-10 DIAGNOSIS — I255 Ischemic cardiomyopathy: Secondary | ICD-10-CM

## 2022-05-10 DIAGNOSIS — Z951 Presence of aortocoronary bypass graft: Secondary | ICD-10-CM

## 2022-05-10 DIAGNOSIS — I6523 Occlusion and stenosis of bilateral carotid arteries: Secondary | ICD-10-CM

## 2022-05-10 DIAGNOSIS — Z9889 Other specified postprocedural states: Secondary | ICD-10-CM

## 2022-05-10 NOTE — Patient Instructions (Signed)
Medication Instructions:  - Your physician recommends that you continue on your current medications as directed. Please refer to the Current Medication list given to you today.  *If you need a refill on your cardiac medications before your next appointment, please call your pharmacy*   Lab Work: - none ordered  If you have labs (blood work) drawn today and your tests are completely normal, you will receive your results only by: [place] (if you have MyChart) OR A paper copy in the mail If you have any lab test that is abnormal or we need to change your treatment, we will call you to review the results.   Testing/Procedures:  1) Echocardiogram (Limited) - in 6 months - Your physician has requested that you have an echocardiogram. Echocardiography is a painless test that uses sound waves to create images of your heart. It provides your doctor with information about the size and shape of your heart and how well your heart's chambers and valves are working. This procedure takes approximately one hour. There are no restrictions for this procedure. There is a possibility that an IV may need to be started during your test to inject an image enhancing agent. This is done to obtain more optimal pictures of your heart. Therefore we ask that you do at least drink some water prior to coming in to hydrate your veins.     Follow-Up: At [facility], you and your health needs are our priority.  As part of our continuing mission to provide you with exceptional heart care, we have created designated Provider Care Teams.  These Care Teams include your primary Cardiologist (physician) and Advanced Practice Providers (APPs -  Physician Assistants and Nurse Practitioners) who all work together to provide you with the care you need, when you need it.  We recommend signing up for the patient portal called "MyChart".  Sign up information is provided on this After Visit Summary.  MyChart is used to connect  with patients for Virtual Visits (Telemedicine).  Patients are able to view lab/test results, encounter notes, upcoming appointments, etc.  Non-urgent messages can be sent to your provider as well.   To learn more about what you can do with MyChart, go to [email or web address].    Your next appointment:   6 month(s)  The format for your next appointment:   In Person  Provider:   You may see Kathlyn Sacramento, MD or one of the following Advanced Practice Providers on your designated Care Team:    Christell Faith, PA-C    Other Instructions N/a  Important Information About Sugar

## 2022-05-15 ENCOUNTER — Encounter: Payer: Self-pay | Admitting: Internal Medicine

## 2022-05-15 ENCOUNTER — Ambulatory Visit: Payer: Medicare HMO | Admitting: Internal Medicine

## 2022-05-15 VITALS — BP 110/60 | Ht 70.0 in | Wt 201.0 lb

## 2022-05-15 DIAGNOSIS — I10 Essential (primary) hypertension: Secondary | ICD-10-CM

## 2022-05-15 DIAGNOSIS — I255 Ischemic cardiomyopathy: Secondary | ICD-10-CM

## 2022-05-15 DIAGNOSIS — E785 Hyperlipidemia, unspecified: Secondary | ICD-10-CM

## 2022-05-15 DIAGNOSIS — I5020 Unspecified systolic (congestive) heart failure: Secondary | ICD-10-CM | POA: Diagnosis not present

## 2022-05-15 DIAGNOSIS — I2510 Atherosclerotic heart disease of native coronary artery without angina pectoris: Secondary | ICD-10-CM | POA: Diagnosis not present

## 2022-05-15 DIAGNOSIS — Z95810 Presence of automatic (implantable) cardiac defibrillator: Secondary | ICD-10-CM | POA: Diagnosis not present

## 2022-05-15 LAB — PACEMAKER DEVICE OBSERVATION

## 2022-05-15 NOTE — Progress Notes (Signed)
Patient Care Team: Patient, No Pcp Per as PCP - General (General Practice) Wellington Hampshire, MD as PCP - Cardiology (Cardiology) Deboraha Sprang, MD as PCP - Electrophysiology (Cardiology) Wellington Hampshire, MD as Consulting Physician (Cardiology)   HPI  [name] [name] is a 61 y.o. male Seen in followup for ICD implanted 2010  originally as part of MADIT-RIT for primary prevention with ischemic cardiomyopathy with previous ejection fraction of 20%; gen change 2/21   Coronary artery disease s/p CABG and mitral valve repair in 2010;    The patient denies chest pain, shortness of breath, nocturnal dyspnea, orthopnea or peripheral edema.  There have been no palpitations, lightheadedness or syncope.  Complains of interval post covid leg pain, fatigue, sleep disturbance and HA  now improving .    DATE TEST EF   7/10 LHC  35 % LADd-T' RCA-T, CXm-T>>CABG  4/17 Echo   40-45 % Apical thrombus  6/23 Echo   30-35%       Date Cr K Hgb LDL  3/18  0.77 4.2     8/19 0.97 4.7  109  2/21 0.94 4.9 15.4 135  4/23 1.14 4.9  58 (5/22)      Past Medical History:  Diagnosis Date   Atrial fibrillation ([place])    a. s/p CABG   Cardiac defibrillator in situ    MADIT RIT trial   Coronary artery disease    a. s/p 2-V CABG (LIMA-LAD, RIMA-RCA) 2010   Hyperlipidemia    Ischemic cardiomyopathy    a. prior EF 20%; b. TTE 4/17: EF 40-45%, ant/antoseptal HK, mild AI, intact mitral valve repair, no sig pulm htn, no evidence of apical thrombus    Mitral valve disease    a. s/p repair 2010 at time of bypass   Post-infarction apical thrombus ([place])    a. s/p Coumadin    Past Surgical History:  Procedure Laterality Date   CARDIAC DEFIBRILLATOR PLACEMENT     [employer]   COLONOSCOPY WITH PROPOFOL N/A 07/31/2016   Procedure: COLONOSCOPY WITH PROPOFOL;  Surgeon: Lollie Sails, MD;  Location: [facility] ENDOSCOPY;  Service: Endoscopy;  Laterality: N/A;   CORONARY ARTERY BYPASS GRAFT  2010    LIMA to LAD, RIMA to RCA and mitral valve repair   ICD GENERATOR CHANGEOUT N/A 12/07/2019   Procedure: ICD GENERATOR CHANGEOUT;  Surgeon: Deboraha Sprang, MD;  Location: [facility];  Service: Cardiovascular;  Laterality: N/A;   KNEE SURGERY     MITRAL VALVE REPAIR  2010    Current Outpatient Medications  Medication Sig Dispense Refill   amoxicillin (AMOXIL) 500 MG tablet Take 1 tablet (500 mg total) by mouth See admin instructions. Take 4 tablets one hour before dental procedure. 4 tablet 0   aspirin EC 81 MG tablet Take 1 tablet (81 mg total) by mouth daily. 90 tablet 3   atorvastatin (LIPITOR) 80 MG tablet Take 1 tablet (80 mg total) by mouth daily. 90 tablet 3   carvedilol (COREG) 25 MG tablet Take 1 tablet (25 mg total) by mouth 2 (two) times daily with a meal. 180 tablet 3   empagliflozin (JARDIANCE) 10 MG TABS tablet Take 1 tablet (10 mg total) by mouth daily. 90 tablet 3   ezetimibe (ZETIA) 10 MG tablet Take 1 tablet (10 mg total) by mouth daily. 90 tablet 3   nitroGLYCERIN (NITROSTAT) 0.4 MG SL tablet PLACE 1 TABLET (0.4 MG TOTAL) UNDER THE TONGUE EVERY 5 (FIVE)  MINUTES AS NEEDED. 25 tablet 0   Omega-3 Fatty Acids (FISH OIL) 1200 MG CAPS Take 2,400 mg by mouth daily.      sacubitril-valsartan (ENTRESTO) 24-26 MG Take 1 tablet by mouth 2 (two) times daily. 180 tablet 3   spironolactone (ALDACTONE) 25 MG tablet TAKE 1 TABLET EVERY DAY 90 tablet 0   No current facility-administered medications for this visit.    No Known Allergies    Review of Systems negative except from HPI and PMH  Physical Exam BP 110/60 (BP Location: Left Arm, Patient Position: Sitting, Cuff Size: Large)   Ht '5\' 10"'$  (1.778 m)   Wt 201 lb (91.2 kg)   SpO2 96%   BMI 28.84 kg/m  Well developed and well nourished in no acute distress HENT normal Neck supple with JVP-flat Clear Device pocket well healed; without hematoma or erythema.  There is no tethering  Regular rate and rhythm, no  gallop No  murmur Abd-soft with active BS No Clubbing cyanosis  edema Skin-warm and dry A & Oriented  Grossly normal sensory and motor function  ECG sinus @ 63 15/10/41     Assessment and Plan:   Ischemic cardiomyopathy    hyperlipidemia  Implantable defibrillator-[employer]    Long Covid    BP well controlled on Guideline directed medical therapy for ischemic cardiomyopathy, continue entresto, coreg, jardiance and aldactone  Device function is normal. Programming changes none  See Paceart for details

## 2022-05-15 NOTE — Patient Instructions (Signed)
Medication Instructions:  - Your physician recommends that you continue on your current medications as directed. Please refer to the Current Medication list given to you today.  *If you need a refill on your cardiac medications before your next appointment, please call your pharmacy*   Lab Work: - none ordered  If you have labs (blood work) drawn today and your tests are completely normal, you will receive your results only by: MyChart Message (if you have MyChart) OR A paper copy in the mail If you have any lab test that is abnormal or we need to change your treatment, we will call you to review the results.   Testing/Procedures: - none ordered   Follow-Up: At [facility], you and your health needs are our priority.  As part of our continuing mission to provide you with exceptional heart care, we have created designated Provider Care Teams.  These Care Teams include your primary Cardiologist (physician) and Advanced Practice Providers (APPs -  Physician Assistants and Nurse Practitioners) who all work together to provide you with the care you need, when you need it.  We recommend signing up for the patient portal called "MyChart".  Sign up information is provided on this After Visit Summary.  MyChart is used to connect with patients for Virtual Visits (Telemedicine).  Patients are able to view lab/test results, encounter notes, upcoming appointments, etc.  Non-urgent messages can be sent to your provider as well.   To learn more about what you can do with MyChart, go to [email or web address].    Your next appointment:   1 year(s)  The format for your next appointment:   In Person  Provider:   Steven Klein, MD    Other Instructions N/a  Important Information About Sugar       

## 2022-05-25 ENCOUNTER — Other Ambulatory Visit: Payer: Self-pay | Admitting: *Deleted

## 2022-05-25 DIAGNOSIS — I6523 Occlusion and stenosis of bilateral carotid arteries: Secondary | ICD-10-CM

## 2022-05-29 ENCOUNTER — Other Ambulatory Visit: Payer: Self-pay

## 2022-05-29 NOTE — Patient Outreach (Signed)
[place] [facility]) Care Management  05/29/2022  [name] [name] November 16, 1960 [id number]   Telephone Screen    Outreach call to patient to introduce [facility] services and assess care needs as part of benefit of PCP office and insurance plan. No answer. RN CM left HIPAA compliant voicemail message along with contact info.    Plan: RN CM will make outreach attempt to patient within 4 business days. RN CM will send unsuccessful outreach letter to patient.   Enzo Montgomery, RN,BSN,CCM [place] Management Telephonic Care Management Coordinator Direct Phone: [phone] Toll Free: [phone] Fax: [phone]

## 2022-05-29 NOTE — Patient Outreach (Signed)
[place] [facility]) Care Management  05/29/2022  [name] [name] [birth year] [id number]   Telephone Screen       Voicemail message received from patient returning RN [name] call. Return call placed to patient.  No answer.      Plan: RN CM will make outreach attempt to patient within 4 business days.  Enzo Montgomery, RN,BSN,CCM [place] Management Telephonic Care Management Coordinator Direct Phone: [phone] Toll Free: [phone] Fax: [phone]

## 2022-05-30 ENCOUNTER — Other Ambulatory Visit: Payer: Self-pay

## 2022-05-30 NOTE — Patient Outreach (Signed)
[place] [facility]) Care Management  05/30/2022  ANTONINO NIENHUIS 12/17/1960 [id number]   Telephone Screen       Outreach call to patient to introduce [facility] services and assess care needs as part of benefit of PCP office and insurance plan. No answer. RN CM left HIPAA compliant voicemail message along with contact info.      Plan: RN CM will make outreach attempt to patient within 4 business days.  Enzo Montgomery, RN,BSN,CCM [place] Management Telephonic Care Management Coordinator Direct Phone: [phone] Toll Free: [phone] Fax: [phone]

## 2022-06-04 ENCOUNTER — Other Ambulatory Visit: Payer: Self-pay

## 2022-06-04 NOTE — Patient Outreach (Signed)
[place] [facility]) Care Management  06/04/2022  [name] [name] 05/14/1961 [id number]   Telephone Screen       Outreach call to patient to introduce [facility] services and assess care needs as part of benefit of PCP office and insurance plan. Spoke with patient. He confirms that he is seeing cardiology. He does not have a current PCP as he did not like his previous one. Both him and his spouse are looking for PCPs. Patient interested in [place] practice near him. RN CM provided him with some options to follow up on. Also, encouraged him to contact [employer] to receive a list of in network physicians for him to review. He voiced understanding. Denies any issues with meds or transportation.   Health Maintenance/Care Gaps: -Last AWV: 10/08/2016. Needs to find new PCP.    Plan: RN CM will close case.  Enzo Montgomery, RN,BSN,CCM [place] Management Telephonic Care Management Coordinator Direct Phone: [phone] Toll Free: [phone] Fax: [phone]

## 2022-06-05 ENCOUNTER — Ambulatory Visit (INDEPENDENT_AMBULATORY_CARE_PROVIDER_SITE_OTHER): Payer: Medicare HMO

## 2022-06-05 DIAGNOSIS — I255 Ischemic cardiomyopathy: Secondary | ICD-10-CM

## 2022-06-05 LAB — CUP PACEART REMOTE DEVICE CHECK
Battery Remaining Longevity: 138 mo
Battery Remaining Percentage: 100 %
Brady Statistic RA Percent Paced: 0 %
Brady Statistic RV Percent Paced: 0 %
Date Time Interrogation Session: 20230815002200
HighPow Impedance: 45 Ohm
Implantable Lead Implant Date: 20101104
Implantable Lead Implant Date: 20101104
Implantable Lead Location: 753859
Implantable Lead Location: 753860
Implantable Lead Model: 158
Implantable Lead Model: 5076
Implantable Lead Serial Number: 301087
Implantable Pulse Generator Implant Date: 20210215
Lead Channel Impedance Value: 489 Ohm
Lead Channel Impedance Value: 505 Ohm
Lead Channel Setting Pacing Amplitude: 2 V
Lead Channel Setting Pacing Amplitude: 2.5 V
Lead Channel Setting Pacing Pulse Width: 0.4 ms
Lead Channel Setting Sensing Sensitivity: 0.5 mV
Pulse Gen Serial Number: 218699

## 2022-06-22 ENCOUNTER — Ambulatory Visit: Payer: Medicare HMO

## 2022-07-06 NOTE — Progress Notes (Signed)
Remote ICD transmission.   

## 2022-08-07 ENCOUNTER — Telehealth: Payer: Self-pay | Admitting: Physician Assistant

## 2022-08-07 ENCOUNTER — Ambulatory Visit: Payer: Medicare HMO | Attending: Physician Assistant

## 2022-08-07 DIAGNOSIS — I6523 Occlusion and stenosis of bilateral carotid arteries: Secondary | ICD-10-CM

## 2022-08-07 NOTE — Telephone Encounter (Signed)
Patient was here for his ultrasound and would like to know Christell Faith, PA's recommendation for a PCP. Please call to advise.

## 2022-08-07 NOTE — Telephone Encounter (Signed)
Spoke with patient and provided him with number to call that will assist in finding him primary care provider. He was very appreciative and had no further questions at this time.

## 2022-08-09 ENCOUNTER — Telehealth: Payer: Self-pay | Admitting: *Deleted

## 2022-08-09 NOTE — Telephone Encounter (Signed)
-----   Message from Rise Mu, PA-C sent at 08/09/2022 12:30 PM EDT ----- Please inform the patient the carotid artery ultrasound showed stable 1-39% narrowing of the bilateral carotid arteries with normal flow of the vertebral and subclavian arteries.

## 2022-08-09 NOTE — Telephone Encounter (Signed)
Left voicemail message to call back for review of results.  

## 2022-08-23 NOTE — Telephone Encounter (Signed)
Patient has reviewed results via My Chart

## 2022-09-04 ENCOUNTER — Ambulatory Visit (INDEPENDENT_AMBULATORY_CARE_PROVIDER_SITE_OTHER): Payer: Medicare HMO

## 2022-09-04 DIAGNOSIS — I255 Ischemic cardiomyopathy: Secondary | ICD-10-CM

## 2022-09-04 LAB — CUP PACEART REMOTE DEVICE CHECK
Battery Remaining Longevity: 144 mo
Battery Remaining Percentage: 100 %
Brady Statistic RA Percent Paced: 0 %
Brady Statistic RV Percent Paced: 0 %
Date Time Interrogation Session: 20231114002100
HighPow Impedance: 49 Ohm
Implantable Lead Connection Status: 753985
Implantable Lead Connection Status: 753985
Implantable Lead Implant Date: 20101104
Implantable Lead Implant Date: 20101104
Implantable Lead Location: 753859
Implantable Lead Location: 753860
Implantable Lead Model: 158
Implantable Lead Model: 5076
Implantable Lead Serial Number: 301087
Implantable Pulse Generator Implant Date: 20210215
Lead Channel Impedance Value: 525 Ohm
Lead Channel Impedance Value: 526 Ohm
Lead Channel Setting Pacing Amplitude: 2 V
Lead Channel Setting Pacing Amplitude: 2.5 V
Lead Channel Setting Pacing Pulse Width: 0.4 ms
Lead Channel Setting Sensing Sensitivity: 0.5 mV
Pulse Gen Serial Number: 218699

## 2022-09-17 ENCOUNTER — Other Ambulatory Visit: Payer: Self-pay

## 2022-09-17 MED ORDER — SPIRONOLACTONE 25 MG PO TABS: 25.0000 mg | ORAL_TABLET | Freq: Every day | ORAL | 1 refills | Status: DC

## 2022-09-17 NOTE — Telephone Encounter (Signed)
From: Barbaraann Barthel To: Office of Christell Faith, [place] Sent: 09/17/2022 1:08 PM EST Subject: Medication Renewal Request  Refills have been requested for the following medications:   spironolactone (ALDACTONE) 25 MG tablet [Ryan Dunn]  Preferred pharmacy: [facility], [address] Delivery method: Mail

## 2022-10-02 NOTE — Progress Notes (Signed)
Remote ICD transmission.   

## 2022-10-09 NOTE — Telephone Encounter (Signed)
Patient came by office Dropped off Documentation for Patient Assistance Placed in nurse box

## 2022-12-04 ENCOUNTER — Ambulatory Visit: Payer: Medicare (Managed Care)

## 2022-12-04 DIAGNOSIS — I255 Ischemic cardiomyopathy: Secondary | ICD-10-CM

## 2022-12-04 LAB — CUP PACEART REMOTE DEVICE CHECK
Battery Remaining Longevity: 138 mo
Battery Remaining Percentage: 100 %
Brady Statistic RA Percent Paced: 0 %
Brady Statistic RV Percent Paced: 0 %
Date Time Interrogation Session: 20240213002100
HighPow Impedance: 46 Ohm
Implantable Lead Connection Status: 753985
Implantable Lead Connection Status: 753985
Implantable Lead Implant Date: 20101104
Implantable Lead Implant Date: 20101104
Implantable Lead Location: 753859
Implantable Lead Location: 753860
Implantable Lead Model: 158
Implantable Lead Model: 5076
Implantable Lead Serial Number: 301087
Implantable Pulse Generator Implant Date: 20210215
Lead Channel Impedance Value: 525 Ohm
Lead Channel Impedance Value: 533 Ohm
Lead Channel Setting Pacing Amplitude: 2 V
Lead Channel Setting Pacing Amplitude: 2.5 V
Lead Channel Setting Pacing Pulse Width: 0.4 ms
Lead Channel Setting Sensing Sensitivity: 0.5 mV
Pulse Gen Serial Number: 218699

## 2022-12-10 ENCOUNTER — Telehealth: Payer: Self-pay | Admitting: Cardiovascular Disease

## 2022-12-10 NOTE — Progress Notes (Deleted)
Cardiology Office Note    Date:  12/10/2022   ID:  [name] [name], DOB [birth year], MRN [id number]  PCP:  Patient, No Pcp Per  Cardiologist:  Kathlyn Sacramento, MD  Electrophysiologist:  Virl Axe, MD   Chief Complaint: Follow up  History of Present Illness:   [name] [name] is a 63 y.o. male with history of CAD status post two-vessel CABG and mitral valve repair in 2010, HFrEF secondary to ICM status post ICD for primary prevention with generator change out in 11/2019, prior apical mural thrombus status post Coumadin, carotid artery disease, HLD, and tobacco use who presents for follow-up of CAD and cardiomyopathy.   He underwent two-vessel CABG with LIMA to LAD and RIMA to RCA along with mitral valve repair in 2010.  Prior EF noted to be 20% with apical mural thrombus.  He underwent ICD implantation as part of the MADIT-RIT trial for primary prevention.  Echo in 01/2016 showed an improved EF of 40 to 45%, anterior and anteroseptal hypokinesis, mild aortic insufficiency, intact mitral valve repair, no significant pulmonary hypertension, and no evidence of apical mural thrombus.  Carotid artery ultrasound from 04/2017 demonstrated no significant carotid artery stenosis along the right side with 50 to 60% stenosis along the left internal carotid artery.  Follow-up carotid artery ultrasound in 05/2018 showed improved bilateral ICA stenosis of 1 to 39%.  Repeat echo, to evaluate his cardiomyopathy, in 05/2020, showed an EF of 35 to 40%, mildly dilated internal LV cavity size, mild LVH, grade 2 diastolic dysfunction, akinesis of the apical anteroseptal wall, anterior wall, and inferior wall, normal RV systolic function and ventricular cavity size, mildly elevated PASP at 40 mmHg, mildly dilated left atrium, prior repair of the mitral valve with mild regurgitation and no evidence of stenosis, and mild to moderate aortic valve insufficiency.  Given a slight reduction in his cardiomyopathy on this most recent  echo, he was transitioned from ACE inhibitor to [place].   He was seen in the office in 12/2020 and was doing well from a cardiac perspective.  He had ran out of his medications approximately 1 week prior.  Updated echo in 05/2021 showed an EF of 30 to 35%, mildly reduced RV systolic function with normal ventricular cavity size, normal PASP, mildly to moderately dilated left atrium, prior repair of the mitral valve with mild regurgitation and no evidence of stenosis, mild to moderate aortic valve insufficiency, and an estimated right atrial pressure of 3 mmHg.  Given slight reduction in LV systolic function, he was initiated on Farxiga 10 mg daily.  Carotid artery ultrasound in 05/2021 showed a stable 1 to 39% bilateral ICA stenosis with bilateral antegrade flow of the vertebral arteries and normal flow hemodynamics in the bilateral subclavian arteries.   He was seen on 07/10/2021 and was doing well from a cardiac perspective.  It was noted he had been unable to fill SGLT2i, though was subsequently started on this after contacting his pharmacy.  He reported an approximate year-long history of myalgias and arthralgias, particularly involving the lower extremities.  Given this, we underwent a trial of holding atorvastatin and ezetimibe.  With this, he only noted a minor/minimal improvement in his lower extremity discomfort, and reported when this discomfort initially began in September 2021 it was "quite severe."  He was advised to resume atorvastatin and ezetimibe.   He was seen in the office on 08/14/2021 and was doing well from a cardiac perspective without symptoms of angina or decompensation.  He  had resumed atorvastatin and ezetimibe without significant worsening in his hip discomfort.   He was seen in the office on 01/08/2022 and was doing well, without symptoms of angina or decompensation.  He remained very active at baseline.  He had been without Jardiance and Entresto for several months leading up to  that visit due to copayment at the pharmacy, with plans to resume them after his visit with [place] following a reduction in his copayment.  Following resumption of maximally tolerated GDMT, he underwent echo on 04/03/2022 which demonstrated a persistent cardiomyopathy with an EF of 30 to 35%, hypokinesis of the mid to distal anterior and septal wall with akinetic and aneurysmal appearing periapical region, grade 2 diastolic dysfunction, no evidence of LV thrombus, mildly reduced RV systolic function with mildly enlarged ventricular cavity size, moderately dilated left atrium, prior repair of the mitral valve with mild regurgitation, and mild aortic insufficiency.  I last saw him in 04/2022, at which time he was doing very well from a cardiac perspective, without symptoms of angina or decompensation.  He had been back on GDMT for approximately 3 to 4 weeks prior to undergoing the above echo.  He noted a significant improvement in his myalgias/arthralgias, with this he was more active.  ***   Labs independently reviewed: 01/2022 - BUN 16, serum creatinine 1.14, potassium 4.9 02/2021 - albumin 4.1, AST/ALT normal, TC 123, TG 136, HDL 38, LDL 58 11/2019 - Hgb 15.4, PLT 304  Past Medical History:  Diagnosis Date   Atrial fibrillation ([place])    a. s/p CABG   Cardiac defibrillator in situ    MADIT RIT trial   Coronary artery disease    a. s/p 2-V CABG (LIMA-LAD, RIMA-RCA) 2010   Hyperlipidemia    Ischemic cardiomyopathy    a. prior EF 20%; b. TTE 4/17: EF 40-45%, ant/antoseptal HK, mild AI, intact mitral valve repair, no sig pulm htn, no evidence of apical thrombus    Mitral valve disease    a. s/p repair 2010 at time of bypass   Post-infarction apical thrombus ([place])    a. s/p Coumadin    Past Surgical History:  Procedure Laterality Date   CARDIAC DEFIBRILLATOR PLACEMENT     [employer]   COLONOSCOPY WITH PROPOFOL N/A 07/31/2016   Procedure: COLONOSCOPY WITH PROPOFOL;  Surgeon: Lollie Sails, MD;  Location: [facility] ENDOSCOPY;  Service: Endoscopy;  Laterality: N/A;   CORONARY ARTERY BYPASS GRAFT  2010   LIMA to LAD, RIMA to RCA and mitral valve repair   ICD GENERATOR CHANGEOUT N/A 12/07/2019   Procedure: ICD GENERATOR CHANGEOUT;  Surgeon: Deboraha Sprang, MD;  Location: [facility];  Service: Cardiovascular;  Laterality: N/A;   KNEE SURGERY     MITRAL VALVE REPAIR  2010    Current Medications: No outpatient medications have been marked as taking for the 12/12/22 encounter (Appointment) with Rise Mu, PA-C.    Allergies:   Patient has no known allergies.   Social History   Socioeconomic History   Marital status: Widowed    Spouse name: Not on file   Number of children: Not on file   Years of education: Not on file   Highest education level: Not on file  Occupational History   Not on file  Tobacco Use   Smoking status: Former    Packs/day: 1.00    Years: 37.00    Total pack years: 37.00    Types: Cigarettes    Quit date: 05/02/2009  Years since quitting: 13.6   Smokeless tobacco: Never  Vaping Use   Vaping Use: Never used  Substance and Sexual Activity   Alcohol use: No    Comment: ocassionally   Drug use: No   Sexual activity: Not on file  Other Topics Concern   Not on file  Social History Narrative   Not on file   Social Determinants of Health   Financial Resource Strain: Not on file  Food Insecurity: Not on file  Transportation Needs: Not on file  Physical Activity: Not on file  Stress: Not on file  Social Connections: Not on file     Family History:  The patient's family history includes Heart attack in his mother; Heart attack (age of onset: 76) in his father; Heart failure in his mother; Hyperlipidemia in an other family member.  ROS:   12-point review of systems is negative unless otherwise noted in the HPI.   EKGs/Labs/Other Studies Reviewed:    Studies reviewed were summarized above. The additional studies were  reviewed today:  Carotid artery ultrasound 08/07/2022: Summary:  Right Carotid: Velocities in the right ICA are consistent with a 1-39%  stenosis.  Non-hemodynamically significant plaque <50% noted in the  CCA. The ECA appears <50% stenosed.   Left Carotid: Velocities in the left ICA are consistent with a 1-39%  stenosis.  Non-hemodynamically significant plaque <50% noted in the  CCA. The ECA appears <50% stenosed.   Vertebrals:  Bilateral vertebral arteries demonstrate antegrade flow.  Subclavians: Normal flow hemodynamics were seen in bilateral subclavian arteries.  __________  2D echo 04/03/2022: 1. Left ventricular ejection fraction, by estimation, is 30 to 35%. The  left ventricle has moderately decreased function. The left ventricle  demonstrates regional wall motion abnormalities (Hypokinesis of teh mid to  distal anterior and septal wall with  akinetic and aneurysmal appearing periapical region). The left ventricular  internal cavity size was mildly dilated. Left ventricular diastolic  parameters are consistent with Grade II diastolic dysfunction  (pseudonormalization).No LV thrombus noted.   2. Right ventricular systolic function is mildly reduced. The right  ventricular size is mildly enlarged.   3. Left atrial size was moderately dilated.   4. The mitral valve has been repaired/replaced. Mild mitral valve  regurgitation. Moderate mitral annular calcification. There is a  prosthetic annuloplasty ring present in the mitral position. Procedure  Date: 2010.   5. The aortic valve was not well visualized. Aortic valve regurgitation  is mild. __________   2D echo 05/2021:  1. Left ventricular ejection fraction, by estimation, is 30 to 35%. The  left ventricle has moderately decreased function. The left ventricle  demonstrates regional wall motion abnormalities (see scoring  diagram/findings for description). Left ventricular   diastolic function could not be evaluated.    2. Right ventricular systolic function is mildly reduced. The right  ventricular size is normal. There is normal pulmonary artery systolic  pressure.   3. Left atrial size was mild to moderately dilated.   4. The mitral valve has been repaired/replaced. Mild mitral valve  regurgitation. No evidence of mitral stenosis. There is a prosthetic  annuloplasty ring present in the mitral position. Procedure Date: 2010.   5. The aortic valve is tricuspid. Aortic valve regurgitation is mild to  moderate. No aortic stenosis is present.   6. The inferior vena cava is normal in size with greater than 50%  respiratory variability, suggesting right atrial pressure of 3 mmHg. __________   2D echo 05/2020: 1. Left  ventricular ejection fraction, by estimation, is 35 to 40%. The  left ventricle has moderately decreased function. The left ventricle  demonstrates regional wall motion abnormalities (see scoring  diagram/findings for description). The left  ventricular internal cavity size was mildly dilated. There is mild left  ventricular hypertrophy. Left ventricular diastolic parameters are  consistent with Grade II diastolic dysfunction (pseudonormalization).  There is akinesis of the left ventricular,  apical anteroseptal wall, anterior wall and inferior wall.   2. Right ventricular systolic function is normal. The right ventricular  size is normal. There is mildly elevated pulmonary artery systolic  pressure. The estimated right ventricular systolic pressure is [id number] mmHg.   3. Left atrial size was mildly dilated.   4. The mitral valve has been repaired/replaced. Mild mitral valve  regurgitation. No evidence of mitral stenosis.   5. The aortic valve is normal in structure. Aortic valve regurgitation is  mild to moderate. No aortic stenosis is present.   6. The inferior vena cava is normal in size with greater than 50%  respiratory variability, suggesting right atrial pressure of 3 mmHg.    Comparison(s): Most recent echo in 2017 showed an EF of 40-45%. __________   2D echo 01/2016: - Left ventricle: The cavity size was normal. Systolic function was    mildly to moderately reduced. The estimated ejection fraction was    in the range of 40% to 45%. Hypokinesis of the anterior,    anteroseptal and apical myocardium. Left ventricular diastolic    function parameters were normal.  - Aortic valve: There was mild regurgitation.  - Mitral valve: A mitral valve repair was present.  - Left atrium: The atrium was moderately dilated.  - Right ventricle: Pacer wire or catheter noted in right ventricle.    Systolic function was normal.  - Pulmonary arteries: Systolic pressure was within the normal    range.   Impressions:   - no apical thrombus noted.   EKG:  EKG is ordered today.  The EKG ordered today demonstrates ***  Recent Labs: 02/02/2022: BUN 16; Creatinine, Ser 1.14; Potassium 4.9; Sodium 138  Recent Lipid Panel    Component Value Date/Time   CHOL 123 03/15/2021 1050   CHOL 160 03/22/2020 1005   TRIG 136 03/15/2021 1050   HDL 38 (L) 03/15/2021 1050   HDL 42 03/22/2020 1005   CHOLHDL 3.2 03/15/2021 1050   VLDL 27 03/15/2021 1050   LDLCALC 58 03/15/2021 1050   LDLCALC 96 03/22/2020 1005   LDLDIRECT 133 (H) 05/27/2018 1538    PHYSICAL EXAM:    VS:  There were no vitals taken for this visit.  BMI: There is no height or weight on file to calculate BMI.  Physical Exam  Wt Readings from Last 3 Encounters:  05/15/22 201 lb (91.2 kg)  05/10/22 202 lb 3.2 oz (91.7 kg)  01/08/22 206 lb (93.4 kg)     ASSESSMENT & PLAN:   CAD status post CABG without angina ***:   HFrEF secondary to ICM status post ICD:   History of mitral valve repair:   History of apical mural thrombus: Status post therapy with Coumadin.   Carotid artery disease: Ultrasound in 07/2022 showed stable 1 to 39% bilateral ICA stenosis.   HLD: LDL 58.   History of tobacco use:    {Are  you ordering a CV Procedure (e.g. stress test, cath, DCCV, TEE, etc)?   Press F2        :[id number]     Disposition: F/u  with Dr. Fletcher Anon or an APP in ***, and EP as directed.    Medication Adjustments/Labs and Tests Ordered: Current medicines are reviewed at length with the patient today.  Concerns regarding medicines are outlined above. Medication changes, Labs and Tests ordered today are summarized above and listed in the Patient Instructions accessible in Encounters.   Signed, Christell Faith, PA-C 12/10/2022 1:04 PM     [address] [place],  [id number] [phone]

## 2022-12-10 NOTE — Telephone Encounter (Signed)
Patient dropped off PAF placed in box 

## 2022-12-11 ENCOUNTER — Other Ambulatory Visit: Payer: Self-pay | Admitting: *Deleted

## 2022-12-11 ENCOUNTER — Telehealth: Payer: Self-pay | Admitting: Cardiovascular Disease

## 2022-12-11 ENCOUNTER — Encounter: Payer: Self-pay | Admitting: *Deleted

## 2022-12-11 MED ORDER — EMPAGLIFLOZIN 10 MG PO TABS: 10.0000 mg | ORAL_TABLET | Freq: Every day | ORAL | 3 refills | Status: DC

## 2022-12-11 MED ORDER — EMPAGLIFLOZIN 10 MG PO TABS: 10.0000 mg | ORAL_TABLET | Freq: Every day | ORAL | 3 refills | Status: AC

## 2022-12-11 NOTE — Telephone Encounter (Signed)
LMOV to reschedule appointment Needs ECHO prior to follow up

## 2022-12-11 NOTE — Telephone Encounter (Signed)
Pulled assistance forms from Dr. Donivan Scull box. Forms are for [name] [name].  The patient was last seen in the office on 05/15/22 by Dr. Caryl Comes. Also seen 05/10/22 by Christell Faith, PA.   Dr. Fletcher Anon is the patient's primary Cardiologist.   Thurmond Butts is in the office this afternoon- will see if he can sign these forms.   Forms given to Olin Hauser, RN to have Ryan sign.

## 2022-12-12 ENCOUNTER — Ambulatory Visit: Payer: Medicare (Managed Care) | Admitting: Physician Assistant

## 2022-12-12 NOTE — Telephone Encounter (Signed)
Faxed the patient's completed application for Jardiance to [place] Patient Assistance Program to [phone].  Fax Confirmation received.  Application placed in the file cabinet for completed applications.

## 2022-12-12 NOTE — Telephone Encounter (Signed)
Forms signed and given to [employer]

## 2023-01-07 NOTE — Progress Notes (Signed)
Remote ICD transmission.   

## 2023-01-23 ENCOUNTER — Ambulatory Visit: Payer: Medicare (Managed Care) | Attending: Physician Assistant

## 2023-01-23 DIAGNOSIS — I255 Ischemic cardiomyopathy: Secondary | ICD-10-CM

## 2023-01-23 DIAGNOSIS — I5020 Unspecified systolic (congestive) heart failure: Secondary | ICD-10-CM

## 2023-01-23 LAB — ECHOCARDIOGRAM LIMITED
AR max vel: 2.75 cm2
AV Area VTI: 2.64 cm2
AV Area mean vel: 2.57 cm2
AV Mean grad: 3 mmHg
AV Peak grad: 5.6 mmHg
Ao pk vel: 1.18 m/s
Area-P 1/2: 3.03 cm2
Calc EF: 43 %
MV M vel: 4.5 m/s
MV Peak grad: 81 mmHg
P 1/2 time: 455 msec
Radius: 1 cm
S' Lateral: 4.5 cm
Single Plane A2C EF: 43 %
Single Plane A4C EF: 40 %

## 2023-01-23 MED ORDER — PERFLUTREN LIPID MICROSPHERE
2.0000 mL | INTRAVENOUS | Status: AC | PRN
  Administered 2023-01-23: 2 mL via INTRAVENOUS

## 2023-01-25 NOTE — Progress Notes (Unsigned)
Cardiology Office Note    Date:  01/28/2023   ID:  [name] [name], DOB 09-11-1961, MRN [id number]  PCP:  Patient, No Pcp Per  Cardiologist:  Lorine Bears, MD  Electrophysiologist:  Sherryl Manges, MD   Chief Complaint: Follow-up  History of Present Illness:   [name] [name] is a 62 y.o. male with history of CAD status post two-vessel CABG and mitral valve repair in 2010, HFrEF secondary to ICM status post ICD for primary prevention with generator change out in 11/2019, prior apical mural thrombus status post Coumadin, carotid artery disease, HLD, and tobacco use who presents for follow-up of CAD and cardiomyopathy.   He underwent two-vessel CABG with LIMA to LAD and RIMA to RCA along with mitral valve repair in 2010.  Prior EF noted to be 20% with apical mural thrombus.  He underwent ICD implantation as part of the MADIT-RIT trial for primary prevention.  Echo in 01/2016 showed an improved EF of 40 to 45%, anterior and anteroseptal hypokinesis, mild aortic insufficiency, intact mitral valve repair, no significant pulmonary hypertension, and no evidence of apical mural thrombus.  Repeat echo, to evaluate his cardiomyopathy, in 05/2020, showed an EF of 35 to 40%, mildly dilated internal LV cavity size, mild LVH, grade 2 diastolic dysfunction, akinesis of the apical anteroseptal wall, anterior wall, and inferior wall, normal RV systolic function and ventricular cavity size, mildly elevated PASP at 40 mmHg, mildly dilated left atrium, prior repair of the mitral valve with mild regurgitation and no evidence of stenosis, and mild to moderate aortic valve insufficiency.  Given a slight reduction in his cardiomyopathy on this most recent echo, he was transitioned from ACE inhibitor to [place].   He was seen in the office in 12/2020 and was doing well from a cardiac perspective.  He had ran out of his medications approximately 1 week prior.  Updated echo in 05/2021 showed an EF of 30 to 35%, mildly reduced RV  systolic function with normal ventricular cavity size, normal PASP, mildly to moderately dilated left atrium, prior repair of the mitral valve with mild regurgitation and no evidence of stenosis, mild to moderate aortic valve insufficiency, and an estimated right atrial pressure of 3 mmHg.  Given slight reduction in LV systolic function, he was initiated on Farxiga 10 mg daily.  Carotid artery ultrasound in 05/2021 showed a stable 1 to 39% bilateral ICA stenosis with bilateral antegrade flow of the vertebral arteries and normal flow hemodynamics in the bilateral subclavian arteries.   He was seen on 07/10/2021 and was doing well from a cardiac perspective.  It was noted he had been unable to fill SGLT2i, though was subsequently started on this after contacting his pharmacy.  He reported an approximate year-long history of myalgias and arthralgias, particularly involving the lower extremities.  Given this, we underwent a trial of holding atorvastatin and ezetimibe.  With this, he only noted a minor/minimal improvement in his lower extremity discomfort, and reported when this discomfort initially began in September 2021 it was "quite severe."  He was advised to resume atorvastatin and ezetimibe.   He was seen in the office on 08/14/2021 and was doing well from a cardiac perspective without symptoms of angina or decompensation.  He had resumed atorvastatin and ezetimibe without significant worsening in his hip discomfort.   He was seen in the office on 01/08/2022 and was doing well, without symptoms of angina or decompensation.  He remained very active at baseline.  He had been without  [place] and Entresto for several months leading up to that visit due to copayment at the pharmacy, with plans to resume them after his visit with [place] following a reduction in his copayment.  Following resumption of maximally tolerated GDMT, he underwent echo on 04/03/2022 which demonstrated a persistent cardiomyopathy with an EF of 30  to 35%, hypokinesis of the mid to distal anterior and septal wall with akinetic and aneurysmal appearing periapical region, grade 2 diastolic dysfunction, no evidence of LV thrombus, mildly reduced RV systolic function with mildly enlarged ventricular cavity size, moderately dilated left atrium, prior repair of the mitral valve with mild regurgitation, and mild aortic insufficiency.  He was last seen in the office in 04/2022 and remained without symptoms of angina or cardiac decompensation.  He had only been back on GDMT for 3.5 to 4 weeks prior to undergoing the above echo.  He noted significant improvement in his myalgias/arthralgias.  He was more active in this setting.  Repeat limited echo on 01/23/2023 showed an EF of 30 to 35%, global hypokinesis with akinesis of the left ventricular apical segment, normal RV systolic function, mild mitral regurgitation, and mild to moderate aortic insufficiency.  He comes in doing well from a cardiac perspective and is without symptoms of angina or cardiac decompensation.  No dyspnea, palpitations, dizziness, presyncope, or syncope.  He remains active at baseline outside of some arthritic changes in his knees and hips.  His weight is down another 8 pounds when compared to his last clinic visit.  He indicates this is intentional with a goal weight of 180 pounds.  No progressive orthopnea.  Adherent and tolerating medications.  He reports it took approximately 1 year for his cardiomyopathy to improve following his bypass.  He does not have any acute concerns at this time.   Labs independently reviewed: 01/2022 - BUN 16, serum creatinine 1.14, potassium 4.9 02/2021 - albumin 4.1, AST/ALT normal, TC 123, TG 136, HDL 38, LDL 58 11/2019 - Hgb 15.4, PLT 304  Past Medical History:  Diagnosis Date   Atrial fibrillation    a. s/p CABG   Cardiac defibrillator in situ    MADIT RIT trial   Coronary artery disease    a. s/p 2-V CABG (LIMA-LAD, RIMA-RCA) 2010   Hyperlipidemia     Ischemic cardiomyopathy    a. prior EF 20%; b. TTE 4/17: EF 40-45%, ant/antoseptal HK, mild AI, intact mitral valve repair, no sig pulm htn, no evidence of apical thrombus    Mitral valve disease    a. s/p repair 2010 at time of bypass   Post-infarction apical thrombus    a. s/p Coumadin    Past Surgical History:  Procedure Laterality Date   CARDIAC DEFIBRILLATOR PLACEMENT     [employer]   COLONOSCOPY WITH PROPOFOL N/A 07/31/2016   Procedure: COLONOSCOPY WITH PROPOFOL;  Surgeon: Christena Deem, MD;  Location: [facility] ENDOSCOPY;  Service: Endoscopy;  Laterality: N/A;   CORONARY ARTERY BYPASS GRAFT  2010   LIMA to LAD, RIMA to RCA and mitral valve repair   ICD GENERATOR CHANGEOUT N/A 12/07/2019   Procedure: ICD GENERATOR CHANGEOUT;  Surgeon: Duke Salvia, MD;  Location: [facility] INVASIVE CV LAB;  Service: Cardiovascular;  Laterality: N/A;   KNEE SURGERY     MITRAL VALVE REPAIR  2010    Current Medications: Current Meds  Medication Sig   amoxicillin (AMOXIL) 500 MG tablet Take 1 tablet (500 mg total) by mouth See admin instructions. Take 4 tablets one hour  before dental procedure.   aspirin EC 81 MG tablet Take 1 tablet (81 mg total) by mouth daily.   atorvastatin (LIPITOR) 80 MG tablet Take 1 tablet (80 mg total) by mouth daily.   carvedilol (COREG) 25 MG tablet Take 1 tablet (25 mg total) by mouth 2 (two) times daily with a meal.   empagliflozin (JARDIANCE) 10 MG TABS tablet Take 1 tablet (10 mg total) by mouth daily.   ezetimibe (ZETIA) 10 MG tablet Take 1 tablet (10 mg total) by mouth daily.   nitroGLYCERIN (NITROSTAT) 0.4 MG SL tablet PLACE 1 TABLET (0.4 MG TOTAL) UNDER THE TONGUE EVERY 5 (FIVE) MINUTES AS NEEDED.   Omega-3 Fatty Acids (FISH OIL) 1200 MG CAPS Take 2,400 mg by mouth daily.    sacubitril-valsartan (ENTRESTO) 24-26 MG Take 1 tablet by mouth 2 (two) times daily.   spironolactone (ALDACTONE) 25 MG tablet Take 1 tablet (25 mg total) by mouth daily.     Allergies:   Patient has no known allergies.   Social History   Socioeconomic History   Marital status: Widowed    Spouse name: Not on file   Number of children: Not on file   Years of education: Not on file   Highest education level: Not on file  Occupational History   Not on file  Tobacco Use   Smoking status: Former    Packs/day: 1.00    Years: 37.00    Additional pack years: 0.00    Total pack years: 37.00    Types: Cigarettes    Quit date: 05/02/2009    Years since quitting: 13.7   Smokeless tobacco: Never  Vaping Use   Vaping Use: Never used  Substance and Sexual Activity   Alcohol use: No    Comment: ocassionally   Drug use: No   Sexual activity: Not on file  Other Topics Concern   Not on file  Social History Narrative   Not on file   Social Determinants of Health   Financial Resource Strain: Not on file  Food Insecurity: Not on file  Transportation Needs: Not on file  Physical Activity: Not on file  Stress: Not on file  Social Connections: Not on file     Family History:  The patient's family history includes Heart attack in his mother; Heart attack (age of onset: 11) in his father; Heart failure in his mother; Hyperlipidemia in an other family member.  ROS:   12-point review of systems is negative unless otherwise noted in the HPI.   EKGs/Labs/Other Studies Reviewed:    Studies reviewed were summarized above. The additional studies were reviewed today:  Limited echo 01/23/2023: 1. Left ventricular ejection fraction, by estimation, is 30 to 35%. The  left ventricle has moderately decreased function. The left ventricle  demonstrates global hypokinesis. There is akinesis of the left  ventricular, apical segment.   2. Right ventricular systolic function is normal.   3. Mild mitral valve regurgitation.   4. Aortic valve regurgitation is mild to moderate.   Comparison(s): 01/01/22 W Def. 30-35%. RWMA including aneurysmal apex. Dil  LV/RV, RV fxn  down, MV Ring (from 2010), mild AI, LAE.  __________  2D echo 04/03/2022: 1. Left ventricular ejection fraction, by estimation, is 30 to 35%. The  left ventricle has moderately decreased function. The left ventricle  demonstrates regional wall motion abnormalities (Hypokinesis of teh mid to  distal anterior and septal wall with  akinetic and aneurysmal appearing periapical region). The left ventricular  internal cavity  size was mildly dilated. Left ventricular diastolic  parameters are consistent with Grade II diastolic dysfunction  (pseudonormalization).No LV thrombus noted.   2. Right ventricular systolic function is mildly reduced. The right  ventricular size is mildly enlarged.   3. Left atrial size was moderately dilated.   4. The mitral valve has been repaired/replaced. Mild mitral valve  regurgitation. Moderate mitral annular calcification. There is a  prosthetic annuloplasty ring present in the mitral position. Procedure  Date: 2010.   5. The aortic valve was not well visualized. Aortic valve regurgitation  is mild. __________   2D echo 05/2021:  1. Left ventricular ejection fraction, by estimation, is 30 to 35%. The  left ventricle has moderately decreased function. The left ventricle  demonstrates regional wall motion abnormalities (see scoring  diagram/findings for description). Left ventricular   diastolic function could not be evaluated.   2. Right ventricular systolic function is mildly reduced. The right  ventricular size is normal. There is normal pulmonary artery systolic  pressure.   3. Left atrial size was mild to moderately dilated.   4. The mitral valve has been repaired/replaced. Mild mitral valve  regurgitation. No evidence of mitral stenosis. There is a prosthetic  annuloplasty ring present in the mitral position. Procedure Date: 2010.   5. The aortic valve is tricuspid. Aortic valve regurgitation is mild to  moderate. No aortic stenosis is present.   6.  The inferior vena cava is normal in size with greater than 50%  respiratory variability, suggesting right atrial pressure of 3 mmHg. __________   2D echo 05/2020: 1. Left ventricular ejection fraction, by estimation, is 35 to 40%. The  left ventricle has moderately decreased function. The left ventricle  demonstrates regional wall motion abnormalities (see scoring  diagram/findings for description). The left  ventricular internal cavity size was mildly dilated. There is mild left  ventricular hypertrophy. Left ventricular diastolic parameters are  consistent with Grade II diastolic dysfunction (pseudonormalization).  There is akinesis of the left ventricular,  apical anteroseptal wall, anterior wall and inferior wall.   2. Right ventricular systolic function is normal. The right ventricular  size is normal. There is mildly elevated pulmonary artery systolic  pressure. The estimated right ventricular systolic pressure is 40.0 mmHg.   3. Left atrial size was mildly dilated.   4. The mitral valve has been repaired/replaced. Mild mitral valve  regurgitation. No evidence of mitral stenosis.   5. The aortic valve is normal in structure. Aortic valve regurgitation is  mild to moderate. No aortic stenosis is present.   6. The inferior vena cava is normal in size with greater than 50%  respiratory variability, suggesting right atrial pressure of 3 mmHg.   Comparison(s): Most recent echo in 2017 showed an EF of 40-45%. __________   2D echo 01/2016: - Left ventricle: The cavity size was normal. Systolic function was    mildly to moderately reduced. The estimated ejection fraction was    in the range of 40% to 45%. Hypokinesis of the anterior,    anteroseptal and apical myocardium. Left ventricular diastolic    function parameters were normal.  - Aortic valve: There was mild regurgitation.  - Mitral valve: A mitral valve repair was present.  - Left atrium: The atrium was moderately dilated.  -  Right ventricle: Pacer wire or catheter noted in right ventricle.    Systolic function was normal.  - Pulmonary arteries: Systolic pressure was within the normal    range.   Impressions:   -  no apical thrombus noted.    EKG:  EKG is ordered today.  The EKG ordered today demonstrates sinus bradycardia, 59 bpm, prior inferior infarct, anterior lateral ST-T changes, no significant change compared to prior tracing  Recent Labs: 02/02/2022: BUN 16; Creatinine, Ser 1.14; Potassium 4.9; Sodium 138  Recent Lipid Panel    Component Value Date/Time   CHOL 123 03/15/2021 1050   CHOL 160 03/22/2020 1005   TRIG 136 03/15/2021 1050   HDL 38 (L) 03/15/2021 1050   HDL 42 03/22/2020 1005   CHOLHDL 3.2 03/15/2021 1050   VLDL 27 03/15/2021 1050   LDLCALC 58 03/15/2021 1050   LDLCALC 96 03/22/2020 1005   LDLDIRECT 133 (H) 05/27/2018 1538    PHYSICAL EXAM:    VS:  BP 112/64   Pulse (!) 59   Ht 5\' 10"  (1.778 m)   Wt 194 lb (88 kg)   SpO2 99%   BMI 27.84 kg/m   BMI: Body mass index is 27.84 kg/m.  Physical Exam Vitals reviewed.  Constitutional:      Appearance: He is well-developed.  HENT:     Head: Normocephalic and atraumatic.  Eyes:     General:        Right eye: No discharge.        Left eye: No discharge.  Neck:     Vascular: No JVD.  Cardiovascular:     Rate and Rhythm: Normal rate and regular rhythm.     Heart sounds: Normal heart sounds, S1 normal and S2 normal. Heart sounds not distant. No midsystolic click and no opening snap. No murmur heard.    No friction rub.  Pulmonary:     Effort: Pulmonary effort is normal. No respiratory distress.     Breath sounds: Normal breath sounds. No decreased breath sounds, wheezing or rales.  Chest:     Chest wall: No tenderness.  Abdominal:     General: There is no distension.  Musculoskeletal:     Cervical back: Normal range of motion.     Right lower leg: No edema.     Left lower leg: No edema.  Skin:    General: Skin is warm  and dry.     Nails: There is no clubbing.  Neurological:     Mental Status: He is alert and oriented to person, place, and time.  Psychiatric:        Speech: Speech normal.        Behavior: Behavior normal.        Thought Content: Thought content normal.        Judgment: Judgment normal.     Wt Readings from Last 3 Encounters:  01/28/23 194 lb (88 kg)  05/15/22 201 lb (91.2 kg)  05/10/22 202 lb 3.2 oz (91.7 kg)     ASSESSMENT & PLAN:   CAD status post CABG without angina: He continues to do very well and is without symptoms concerning for angina.  Continue current medical therapy including aspirin, carvedilol, atorvastatin, and ezetimibe.  Aggressive risk factor modification.  Given the lack of anginal or symptoms of decompensation, we will defer further cardiac testing at this time.  HFrEF secondary to ICM status post ICD: He is euvolemic and well compensated with NYHA class I symptoms.  No device discharges.  Most recent echo demonstrated a stable cardiomyopathy EF 30 to 35%.  Continue GDMT including carvedilol, Jardiance, Entresto, and spironolactone.  Given lack of anginal symptoms, or cardiac decompensation, we will defer further cardiac testing at  this time.  Unclear if we would be able to pursue cardiac MRI given his history of ICD.  QRS not wide enough for CRT.  Recent renal function and potassium stable.  Follow-up with EP as directed.  History of mitral valve repair: Stable, mild mitral regurgitation on echo earlier this month.  History of apical mural thrombus: Status post therapy with Coumadin.  Most recent echo showed no evidence of mural thrombus.  Carotid artery disease: Ultrasound from 05/2021 showed stable 1 to 39% bilateral ICA stenosis.  HLD: LDL 58.  He remains on ezetimibe and atorvastatin.     Disposition: F/u with Dr. Kirke CorinArida or an APP in 6 months, and EP as directed.   Medication Adjustments/Labs and Tests Ordered: Current medicines are reviewed at length  with the patient today.  Concerns regarding medicines are outlined above. Medication changes, Labs and Tests ordered today are summarized above and listed in the Patient Instructions accessible in Encounters.   Signed, Eula Listenyan Rizwan Kuyper, PA-C 01/28/2023 9:18 AM     [facility] [place] [address] [place], [place] [address] [phone]

## 2023-01-28 ENCOUNTER — Encounter: Payer: Self-pay | Admitting: Physician Assistant

## 2023-01-28 ENCOUNTER — Ambulatory Visit: Payer: Medicare (Managed Care) | Attending: Physician Assistant | Admitting: Physician Assistant

## 2023-01-28 VITALS — BP 112/64 | HR 59 | Ht 70.0 in | Wt 194.0 lb

## 2023-01-28 DIAGNOSIS — Z9889 Other specified postprocedural states: Secondary | ICD-10-CM

## 2023-01-28 DIAGNOSIS — Z951 Presence of aortocoronary bypass graft: Secondary | ICD-10-CM

## 2023-01-28 DIAGNOSIS — I6523 Occlusion and stenosis of bilateral carotid arteries: Secondary | ICD-10-CM

## 2023-01-28 DIAGNOSIS — E785 Hyperlipidemia, unspecified: Secondary | ICD-10-CM | POA: Diagnosis not present

## 2023-01-28 DIAGNOSIS — I5020 Unspecified systolic (congestive) heart failure: Secondary | ICD-10-CM | POA: Diagnosis not present

## 2023-01-28 DIAGNOSIS — Z95810 Presence of automatic (implantable) cardiac defibrillator: Secondary | ICD-10-CM

## 2023-01-28 DIAGNOSIS — I2510 Atherosclerotic heart disease of native coronary artery without angina pectoris: Secondary | ICD-10-CM

## 2023-01-28 DIAGNOSIS — I255 Ischemic cardiomyopathy: Secondary | ICD-10-CM

## 2023-01-28 NOTE — Patient Instructions (Signed)
Medication Instructions:  No changes at this time.   *If you need a refill on your cardiac medications before your next appointment, please call your pharmacy*   Lab Work: CMET & Lipid panel to be done prior to next appointment. These are fasting labs so nothing to eat or drink after midnight before except sip of water with your medications.   No appointment is needed. Just go to the [facility] entrance and stop at registration desk to check in.   If you have labs (blood work) drawn today and your tests are completely normal, you will receive your results only by: MyChart Message (if you have MyChart) OR A paper copy in the mail If you have any lab test that is abnormal or we need to change your treatment, we will call you to review the results.   Testing/Procedures: None   Follow-Up: At [facility], you and your health needs are our priority.  As part of our continuing mission to provide you with exceptional heart care, we have created designated Provider Care Teams.  These Care Teams include your primary Cardiologist (physician) and Advanced Practice Providers (APPs -  Physician Assistants and Nurse Practitioners) who all work together to provide you with the care you need, when you need it.   Your next appointment:   6 month(s)  Provider:   Lorine Bears, MD or Eula Listen, PA-C

## 2023-02-11 ENCOUNTER — Other Ambulatory Visit: Payer: Self-pay

## 2023-02-11 MED ORDER — SPIRONOLACTONE 25 MG PO TABS: 25.0000 mg | ORAL_TABLET | Freq: Every day | ORAL | 0 refills | Status: DC

## 2023-02-11 MED ORDER — CARVEDILOL 25 MG PO TABS: 25.0000 mg | ORAL_TABLET | Freq: Two times a day (BID) | ORAL | 0 refills | Status: DC

## 2023-02-11 MED ORDER — EZETIMIBE 10 MG PO TABS: 10.0000 mg | ORAL_TABLET | Freq: Every day | ORAL | 0 refills | Status: DC

## 2023-02-11 MED ORDER — NITROGLYCERIN 0.4 MG SL SUBL: 0.4000 mg | SUBLINGUAL_TABLET | SUBLINGUAL | 0 refills | Status: DC | PRN

## 2023-02-11 MED ORDER — ATORVASTATIN CALCIUM 80 MG PO TABS: 80.0000 mg | ORAL_TABLET | Freq: Every day | ORAL | 0 refills | Status: DC

## 2023-02-18 ENCOUNTER — Other Ambulatory Visit: Payer: Self-pay

## 2023-02-18 MED ORDER — NITROGLYCERIN 0.4 MG SL SUBL: 0.4000 mg | SUBLINGUAL_TABLET | SUBLINGUAL | 1 refills | Status: DC | PRN

## 2023-03-05 ENCOUNTER — Ambulatory Visit: Payer: Medicare (Managed Care)

## 2023-03-05 DIAGNOSIS — I429 Cardiomyopathy, unspecified: Secondary | ICD-10-CM | POA: Diagnosis not present

## 2023-03-05 DIAGNOSIS — I5022 Chronic systolic (congestive) heart failure: Secondary | ICD-10-CM

## 2023-03-08 LAB — CUP PACEART REMOTE DEVICE CHECK
Battery Remaining Longevity: 138 mo
Battery Remaining Percentage: 100 %
Brady Statistic RA Percent Paced: 0 %
Brady Statistic RV Percent Paced: 0 %
Date Time Interrogation Session: 20240517102600
HighPow Impedance: 46 Ohm
Implantable Lead Connection Status: 753985
Implantable Lead Connection Status: 753985
Implantable Lead Implant Date: 20101104
Implantable Lead Implant Date: 20101104
Implantable Lead Location: 753859
Implantable Lead Location: 753860
Implantable Lead Model: 158
Implantable Lead Model: 5076
Implantable Lead Serial Number: 301087
Implantable Pulse Generator Implant Date: 20210215
Lead Channel Impedance Value: 521 Ohm
Lead Channel Impedance Value: 523 Ohm
Lead Channel Setting Pacing Amplitude: 2 V
Lead Channel Setting Pacing Amplitude: 2.5 V
Lead Channel Setting Pacing Pulse Width: 0.4 ms
Lead Channel Setting Sensing Sensitivity: 0.5 mV
Pulse Gen Serial Number: 218699

## 2023-04-01 NOTE — Progress Notes (Signed)
Remote ICD transmission.   

## 2023-05-03 ENCOUNTER — Other Ambulatory Visit: Payer: Self-pay | Admitting: Physician Assistant

## 2023-06-04 ENCOUNTER — Ambulatory Visit (INDEPENDENT_AMBULATORY_CARE_PROVIDER_SITE_OTHER): Payer: Medicare (Managed Care)

## 2023-06-04 DIAGNOSIS — I429 Cardiomyopathy, unspecified: Secondary | ICD-10-CM

## 2023-06-04 LAB — CUP PACEART REMOTE DEVICE CHECK
Battery Remaining Longevity: 126 mo
Battery Remaining Percentage: 100 %
Brady Statistic RA Percent Paced: 0 %
Brady Statistic RV Percent Paced: 0 %
Date Time Interrogation Session: 20240813002100
HighPow Impedance: 46 Ohm
Implantable Lead Connection Status: 753985
Implantable Lead Connection Status: 753985
Implantable Lead Implant Date: 20101104
Implantable Lead Implant Date: 20101104
Implantable Lead Location: 753859
Implantable Lead Location: 753860
Implantable Lead Model: 158
Implantable Lead Model: 5076
Implantable Lead Serial Number: 301087
Implantable Pulse Generator Implant Date: 20210215
Lead Channel Impedance Value: 536 Ohm
Lead Channel Impedance Value: 554 Ohm
Lead Channel Setting Pacing Amplitude: 2 V
Lead Channel Setting Pacing Amplitude: 2.5 V
Lead Channel Setting Pacing Pulse Width: 0.4 ms
Lead Channel Setting Sensing Sensitivity: 0.5 mV
Pulse Gen Serial Number: 218699

## 2023-06-19 NOTE — Progress Notes (Signed)
Remote ICD transmission.   

## 2023-07-29 NOTE — Progress Notes (Unsigned)
Cardiology Office Note    Date:  07/29/2023   ID:  [name] [name], DOB 29-May-1961, MRN [id number]  PCP:  Patient, No Pcp Per  Cardiologist:  Lorine Bears, MD  Electrophysiologist:  Sherryl Manges, MD   Chief Complaint: Follow up  History of Present Illness:   [name] [name] is a 61 y.o. male with history of CAD status post two-vessel CABG and mitral valve repair in 2010, HFrEF secondary to ICM status post ICD for primary prevention with generator change out in 11/2019, prior apical mural thrombus status post Coumadin, carotid artery disease, HLD, and tobacco use who presents for follow-up of CAD and cardiomyopathy.   He underwent two-vessel CABG with LIMA to LAD and RIMA to RCA along with mitral valve repair in 2010.  Prior EF noted to be 20% with apical mural thrombus.  He underwent ICD implantation as part of the MADIT-RIT trial for primary prevention.  Echo in 01/2016 showed an improved EF of 40 to 45%, anterior and anteroseptal hypokinesis, mild aortic insufficiency, intact mitral valve repair, no significant pulmonary hypertension, and no evidence of apical mural thrombus.  Repeat echo, to evaluate his cardiomyopathy, in 05/2020, showed an EF of 35 to 40%, mildly dilated internal LV cavity size, mild LVH, grade 2 diastolic dysfunction, akinesis of the apical anteroseptal wall, anterior wall, and inferior wall, normal RV systolic function and ventricular cavity size, mildly elevated PASP at 40 mmHg, mildly dilated left atrium, prior repair of the mitral valve with mild regurgitation and no evidence of stenosis, and mild to moderate aortic valve insufficiency.  Given a slight reduction in his cardiomyopathy on this most recent echo, he was transitioned from ACE inhibitor to [place].   He was seen in the office in 12/2020 and was doing well from a cardiac perspective.  He had ran out of his medications approximately 1 week prior.  Updated echo in 05/2021 showed an EF of 30 to 35%, mildly reduced RV  systolic function with normal ventricular cavity size, normal PASP, mildly to moderately dilated left atrium, prior repair of the mitral valve with mild regurgitation and no evidence of stenosis, mild to moderate aortic valve insufficiency, and an estimated right atrial pressure of 3 mmHg.  Given slight reduction in LV systolic function, he was initiated on Farxiga 10 mg daily.  Carotid artery ultrasound in 05/2021 showed a stable 1 to 39% bilateral ICA stenosis with bilateral antegrade flow of the vertebral arteries and normal flow hemodynamics in the bilateral subclavian arteries.   He was seen on 07/10/2021 and was doing well from a cardiac perspective.  It was noted he had been unable to fill SGLT2i, though was subsequently started on this after contacting his pharmacy.  He reported an approximate year-long history of myalgias and arthralgias, particularly involving the lower extremities.  Given this, we underwent a trial of holding atorvastatin and ezetimibe.  With this, he only noted a minor/minimal improvement in his lower extremity discomfort, and reported when this discomfort initially began in September 2021 it was "quite severe."  He was advised to resume atorvastatin and ezetimibe.   He was seen in the office on 01/08/2022 and remained very active at baseline.  He had been without Jardiance and Entresto for several months leading up to that visit due to copayment at the pharmacy, with plans to resume them after his visit with [place] following a reduction in his copayment.  Following resumption of maximally tolerated GDMT, he underwent echo on 04/03/2022 which demonstrated a  persistent cardiomyopathy with an EF of 30 to 35%, hypokinesis of the mid to distal anterior and septal wall with akinetic and aneurysmal appearing periapical region, grade 2 diastolic dysfunction, no evidence of LV thrombus, mildly reduced RV systolic function with mildly enlarged ventricular cavity size, moderately dilated left atrium,  prior repair of the mitral valve with mild regurgitation, and mild aortic insufficiency.   He was last seen in the office in 04/2022 and had only been back on GDMT for 3.5 to 4 weeks prior to undergoing the above echo.  He noted significant improvement in his myalgias/arthralgias.  He was more active in this setting.  Repeat limited echo on 01/23/2023 showed an EF of 30 to 35%, global hypokinesis with akinesis of the left ventricular apical segment, normal RV systolic function, mild mitral regurgitation, and mild to moderate aortic insufficiency.  He was last seen in the office in 01/2023 and remained very active at baseline with continuation of GDMT.  ***   Labs independently reviewed: 01/2023 - BUN 16, serum creatinine 1.14, potassium 4.9 02/2021 - albumin 4.1, AST/ALT normal, TC 123, TG 136, HDL 38, LDL 58 11/2019 - Hgb 15.4, PLT 304  Past Medical History:  Diagnosis Date   Atrial fibrillation (HCC)    a. s/p CABG   Cardiac defibrillator in situ    MADIT RIT trial   Coronary artery disease    a. s/p 2-V CABG (LIMA-LAD, RIMA-RCA) 2010   Hyperlipidemia    Ischemic cardiomyopathy    a. prior EF 20%; b. TTE 4/17: EF 40-45%, ant/antoseptal HK, mild AI, intact mitral valve repair, no sig pulm htn, no evidence of apical thrombus    Mitral valve disease    a. s/p repair 2010 at time of bypass   Post-infarction apical thrombus (HCC)    a. s/p Coumadin    Past Surgical History:  Procedure Laterality Date   CARDIAC DEFIBRILLATOR PLACEMENT     [employer]   COLONOSCOPY WITH PROPOFOL N/A 07/31/2016   Procedure: COLONOSCOPY WITH PROPOFOL;  Surgeon: Christena Deem, MD;  Location: [facility] ENDOSCOPY;  Service: Endoscopy;  Laterality: N/A;   CORONARY ARTERY BYPASS GRAFT  2010   LIMA to LAD, RIMA to RCA and mitral valve repair   ICD GENERATOR CHANGEOUT N/A 12/07/2019   Procedure: ICD GENERATOR CHANGEOUT;  Surgeon: Duke Salvia, MD;  Location: [facility] INVASIVE CV LAB;  Service: Cardiovascular;   Laterality: N/A;   KNEE SURGERY     MITRAL VALVE REPAIR  2010    Current Medications: No outpatient medications have been marked as taking for the 07/30/23 encounter (Appointment) with Sondra Barges, PA-C.    Allergies:   Patient has no known allergies.   Social History   Socioeconomic History   Marital status: Widowed    Spouse name: Not on file   Number of children: Not on file   Years of education: Not on file   Highest education level: Not on file  Occupational History   Not on file  Tobacco Use   Smoking status: Former    Current packs/day: 0.00    Average packs/day: 1 pack/day for 37.0 years (37.0 ttl pk-yrs)    Types: Cigarettes    Start date: 05/02/1972    Quit date: 05/02/2009    Years since quitting: 14.2   Smokeless tobacco: Never  Vaping Use   Vaping status: Never Used  Substance and Sexual Activity   Alcohol use: No    Comment: ocassionally   Drug use: No  Sexual activity: Not on file  Other Topics Concern   Not on file  Social History Narrative   Not on file   Social Determinants of Health   Financial Resource Strain: Not on file  Food Insecurity: Not on file  Transportation Needs: Not on file  Physical Activity: Not on file  Stress: Not on file  Social Connections: Not on file     Family History:  The patient's family history includes Heart attack in his mother; Heart attack (age of onset: 63) in his father; Heart failure in his mother; Hyperlipidemia in an other family member.  ROS:   12-point review of systems is negative unless otherwise noted in the HPI.   EKGs/Labs/Other Studies Reviewed:    Studies reviewed were summarized above. The additional studies were reviewed today:  Limited echo 01/23/2023: 1. Left ventricular ejection fraction, by estimation, is 30 to 35%. The  left ventricle has moderately decreased function. The left ventricle  demonstrates global hypokinesis. There is akinesis of the left  ventricular, apical segment.    2. Right ventricular systolic function is normal.   3. Mild mitral valve regurgitation.   4. Aortic valve regurgitation is mild to moderate.   Comparison(s): 01/01/22 W Def. 30-35%. RWMA including aneurysmal apex. Dil  LV/RV, RV fxn down, MV Ring (from 2010), mild AI, LAE.  __________   2D echo 04/03/2022: 1. Left ventricular ejection fraction, by estimation, is 30 to 35%. The  left ventricle has moderately decreased function. The left ventricle  demonstrates regional wall motion abnormalities (Hypokinesis of teh mid to  distal anterior and septal wall with  akinetic and aneurysmal appearing periapical region). The left ventricular  internal cavity size was mildly dilated. Left ventricular diastolic  parameters are consistent with Grade II diastolic dysfunction  (pseudonormalization).No LV thrombus noted.   2. Right ventricular systolic function is mildly reduced. The right  ventricular size is mildly enlarged.   3. Left atrial size was moderately dilated.   4. The mitral valve has been repaired/replaced. Mild mitral valve  regurgitation. Moderate mitral annular calcification. There is a  prosthetic annuloplasty ring present in the mitral position. Procedure  Date: 2010.   5. The aortic valve was not well visualized. Aortic valve regurgitation  is mild. __________   2D echo 05/2021:  1. Left ventricular ejection fraction, by estimation, is 30 to 35%. The  left ventricle has moderately decreased function. The left ventricle  demonstrates regional wall motion abnormalities (see scoring  diagram/findings for description). Left ventricular   diastolic function could not be evaluated.   2. Right ventricular systolic function is mildly reduced. The right  ventricular size is normal. There is normal pulmonary artery systolic  pressure.   3. Left atrial size was mild to moderately dilated.   4. The mitral valve has been repaired/replaced. Mild mitral valve  regurgitation. No evidence of  mitral stenosis. There is a prosthetic  annuloplasty ring present in the mitral position. Procedure Date: 2010.   5. The aortic valve is tricuspid. Aortic valve regurgitation is mild to  moderate. No aortic stenosis is present.   6. The inferior vena cava is normal in size with greater than 50%  respiratory variability, suggesting right atrial pressure of 3 mmHg. __________   2D echo 05/2020: 1. Left ventricular ejection fraction, by estimation, is 35 to 40%. The  left ventricle has moderately decreased function. The left ventricle  demonstrates regional wall motion abnormalities (see scoring  diagram/findings for description). The left  ventricular internal  cavity size was mildly dilated. There is mild left  ventricular hypertrophy. Left ventricular diastolic parameters are  consistent with Grade II diastolic dysfunction (pseudonormalization).  There is akinesis of the left ventricular,  apical anteroseptal wall, anterior wall and inferior wall.   2. Right ventricular systolic function is normal. The right ventricular  size is normal. There is mildly elevated pulmonary artery systolic  pressure. The estimated right ventricular systolic pressure is 40.0 mmHg.   3. Left atrial size was mildly dilated.   4. The mitral valve has been repaired/replaced. Mild mitral valve  regurgitation. No evidence of mitral stenosis.   5. The aortic valve is normal in structure. Aortic valve regurgitation is  mild to moderate. No aortic stenosis is present.   6. The inferior vena cava is normal in size with greater than 50%  respiratory variability, suggesting right atrial pressure of 3 mmHg.   Comparison(s): Most recent echo in 2017 showed an EF of 40-45%. __________   2D echo 01/2016: - Left ventricle: The cavity size was normal. Systolic function was    mildly to moderately reduced. The estimated ejection fraction was    in the range of 40% to 45%. Hypokinesis of the anterior,    anteroseptal and  apical myocardium. Left ventricular diastolic    function parameters were normal.  - Aortic valve: There was mild regurgitation.  - Mitral valve: A mitral valve repair was present.  - Left atrium: The atrium was moderately dilated.  - Right ventricle: Pacer wire or catheter noted in right ventricle.    Systolic function was normal.  - Pulmonary arteries: Systolic pressure was within the normal    range.   Impressions:   - no apical thrombus noted.    EKG:  EKG is ordered today.  The EKG ordered today demonstrates ***  Recent Labs: No results found for requested labs within last 365 days.  Recent Lipid Panel    Component Value Date/Time   CHOL 123 03/15/2021 1050   CHOL 160 03/22/2020 1005   TRIG 136 03/15/2021 1050   HDL 38 (L) 03/15/2021 1050   HDL 42 03/22/2020 1005   CHOLHDL 3.2 03/15/2021 1050   VLDL 27 03/15/2021 1050   LDLCALC 58 03/15/2021 1050   LDLCALC 96 03/22/2020 1005   LDLDIRECT 133 (H) 05/27/2018 1538    PHYSICAL EXAM:    VS:  There were no vitals taken for this visit.  BMI: There is no height or weight on file to calculate BMI.  Physical Exam  Wt Readings from Last 3 Encounters:  01/28/23 194 lb (88 kg)  05/15/22 201 lb (91.2 kg)  05/10/22 202 lb 3.2 oz (91.7 kg)     ASSESSMENT & PLAN:   CAD status post CABG without angina:   HFrEF secondary to ICM status post ICD:   History of mitral valve repair:   History of apical mural thrombus: Status post therapy with Coumadin. Most recent echo showed no evidence of mural thrombus.   Carotid artery disease:   HLD: LDL 58.   {Are you ordering a CV Procedure (e.g. stress test, cath, DCCV, TEE, etc)?   Press F2        :[id number]}     Disposition: F/u with Dr. Kirke Corin or an APP in ***, and EP as directed.    Medication Adjustments/Labs and Tests Ordered: Current medicines are reviewed at length with the patient today.  Concerns regarding medicines are outlined above. Medication changes, Labs and  Tests ordered today are summarized  above and listed in the Patient Instructions accessible in Encounters.   Signed, Eula Listen, PA-C 07/29/2023 1:29 PM     [facility] [place] [address] [place], [place] [address] [phone]

## 2023-07-30 ENCOUNTER — Ambulatory Visit: Payer: Medicare (Managed Care) | Admitting: Physician Assistant

## 2023-07-30 ENCOUNTER — Ambulatory Visit: Payer: Medicare (Managed Care) | Attending: Internal Medicine | Admitting: Internal Medicine

## 2023-07-30 ENCOUNTER — Encounter: Payer: Self-pay | Admitting: Internal Medicine

## 2023-07-30 VITALS — BP 112/58 | HR 64 | Ht 70.0 in | Wt 189.0 lb

## 2023-07-30 VITALS — BP 123/76 | HR 61 | Ht 70.0 in | Wt 189.0 lb

## 2023-07-30 DIAGNOSIS — Z95810 Presence of automatic (implantable) cardiac defibrillator: Secondary | ICD-10-CM | POA: Diagnosis not present

## 2023-07-30 DIAGNOSIS — I5020 Unspecified systolic (congestive) heart failure: Secondary | ICD-10-CM

## 2023-07-30 DIAGNOSIS — I429 Cardiomyopathy, unspecified: Secondary | ICD-10-CM | POA: Diagnosis not present

## 2023-07-30 DIAGNOSIS — Z951 Presence of aortocoronary bypass graft: Secondary | ICD-10-CM | POA: Diagnosis not present

## 2023-07-30 DIAGNOSIS — I2510 Atherosclerotic heart disease of native coronary artery without angina pectoris: Secondary | ICD-10-CM

## 2023-07-30 DIAGNOSIS — I5022 Chronic systolic (congestive) heart failure: Secondary | ICD-10-CM | POA: Diagnosis not present

## 2023-07-30 LAB — CUP PACEART INCLINIC DEVICE CHECK
Date Time Interrogation Session: 20241008135510
HighPow Impedance: 48 Ohm
Implantable Lead Connection Status: 753985
Implantable Lead Connection Status: 753985
Implantable Lead Implant Date: 20101104
Implantable Lead Implant Date: 20101104
Implantable Lead Location: 753859
Implantable Lead Location: 753860
Implantable Lead Model: 158
Implantable Lead Model: 5076
Implantable Lead Serial Number: 301087
Implantable Pulse Generator Implant Date: 20210215
Lead Channel Impedance Value: 516 Ohm
Lead Channel Impedance Value: 556 Ohm
Lead Channel Pacing Threshold Amplitude: 0.6 V
Lead Channel Pacing Threshold Amplitude: 0.6 V
Lead Channel Pacing Threshold Pulse Width: 0.4 ms
Lead Channel Pacing Threshold Pulse Width: 0.4 ms
Lead Channel Sensing Intrinsic Amplitude: 25 mV
Lead Channel Sensing Intrinsic Amplitude: 3 mV
Lead Channel Setting Pacing Amplitude: 2 V
Lead Channel Setting Pacing Amplitude: 2.5 V
Lead Channel Setting Pacing Pulse Width: 0.4 ms
Lead Channel Setting Sensing Sensitivity: 0.5 mV
Pulse Gen Serial Number: 218699

## 2023-07-30 NOTE — Patient Instructions (Signed)
Medication Instructions:  The current medical regimen is effective;  continue present plan and medications.  *If you need a refill on your cardiac medications before your next appointment, please call your pharmacy*   Lab Work: Your provider would like for you to have following labs drawn today BMET, CBC.   If you have labs (blood work) drawn today and your tests are completely normal, you will receive your results only by: MyChart Message (if you have MyChart) OR A paper copy in the mail If you have any lab test that is abnormal or we need to change your treatment, we will call you to review the results.   Follow-Up: At [facility], you and your health needs are our priority.  As part of our continuing mission to provide you with exceptional heart care, we have created designated Provider Care Teams.  These Care Teams include your primary Cardiologist (physician) and Advanced Practice Providers (APPs -  Physician Assistants and Nurse Practitioners) who all work together to provide you with the care you need, when you need it.  We recommend signing up for the patient portal called "MyChart".  Sign up information is provided on this After Visit Summary.  MyChart is used to connect with patients for Virtual Visits (Telemedicine).  Patients are able to view lab/test results, encounter notes, upcoming appointments, etc.  Non-urgent messages can be sent to your provider as well.   To learn more about what you can do with MyChart, go to [email or web address].    Your next appointment:   12 month(s)  Provider:   Sherryl Manges, MD

## 2023-07-30 NOTE — Progress Notes (Signed)
Patient Care Team: Patient, No Pcp Per as PCP - General (General Practice) [place] Ouch, MD as PCP - Cardiology (Cardiology) Duke Salvia, MD as PCP - Electrophysiology (Cardiology) [place] Ouch, MD as Consulting Physician (Cardiology)   HPI  [name] [name] is a 62 y.o. male Seen in followup for ICD implanted 2010  originally as part of MADIT-RIT for primary prevention with ischemic cardiomyopathy with previous ejection fraction of 20%; gen change 2/21   Coronary artery disease s/p CABG and mitral valve repair in 2010; interval history of LV thrombus   The patient denies chest pain, shortness of breath, nocturnal dyspnea, orthopnea or peripheral edema.  There have been no palpitations, lightheadedness or syncope.     DATE TEST EF   7/10   LADd-T' RCA-T, CXm-T>>CABG  4/17 Echo   40-45 % Apical thrombus  6/23 Echo  30-35% Apical aneurysm      Date Cr K Hgb LDL  3/18  0.77 4.2     8/19 0.97 4.7  109  2/21 0.94 4.9 15.4 135  4/23 1.14 4.9  58 (5/22)      Past Medical History:  Diagnosis Date   Atrial fibrillation (HCC)    a. s/p CABG   Cardiac defibrillator in situ    MADIT RIT trial   Coronary artery disease    a. s/p 2-V CABG (LIMA-LAD, RIMA-RCA) 2010   Hyperlipidemia    Ischemic cardiomyopathy    a. prior EF 20%; b. TTE 4/17: EF 40-45%, ant/antoseptal HK, mild AI, intact mitral valve repair, no sig pulm htn, no evidence of apical thrombus    Mitral valve disease    a. s/p repair 2010 at time of bypass   Post-infarction apical thrombus (HCC)    a. s/p Coumadin    Past Surgical History:  Procedure Laterality Date   CARDIAC DEFIBRILLATOR PLACEMENT     [employer]   COLONOSCOPY WITH PROPOFOL N/A 07/31/2016   Procedure: COLONOSCOPY WITH PROPOFOL;  Surgeon: Christena Deem, MD;  Location: [facility] ENDOSCOPY;  Service: Endoscopy;  Laterality: N/A;   CORONARY ARTERY BYPASS GRAFT  2010   LIMA to LAD, RIMA to RCA and mitral valve repair   ICD  GENERATOR CHANGEOUT N/A 12/07/2019   Procedure: ICD GENERATOR CHANGEOUT;  Surgeon: Duke Salvia, MD;  Location: [facility] INVASIVE CV LAB;  Service: Cardiovascular;  Laterality: N/A;   KNEE SURGERY     MITRAL VALVE REPAIR  2010    Current Outpatient Medications  Medication Sig Dispense Refill   amoxicillin (AMOXIL) 500 MG tablet Take 1 tablet (500 mg total) by mouth See admin instructions. Take 4 tablets one hour before dental procedure. 4 tablet 0   aspirin EC 81 MG tablet Take 1 tablet (81 mg total) by mouth daily. 90 tablet 3   atorvastatin (LIPITOR) 80 MG tablet TAKE 1 TABLET DAILY 90 tablet 0   carvedilol (COREG) 25 MG tablet TAKE 1 TABLET TWICE A DAY WITH MEALS 180 tablet 0   empagliflozin (JARDIANCE) 10 MG TABS tablet Take 1 tablet (10 mg total) by mouth daily. 90 tablet 3   ezetimibe (ZETIA) 10 MG tablet TAKE 1 TABLET DAILY 90 tablet 0   nitroGLYCERIN (NITROSTAT) 0.4 MG SL tablet Place 1 tablet (0.4 mg total) under the tongue every 5 (five) minutes as needed. 25 tablet 1   Omega-3 Fatty Acids (FISH OIL) 1200 MG CAPS Take 2,400 mg by mouth daily.      sacubitril-valsartan (ENTRESTO) 24-26 MG  Take 1 tablet by mouth 2 (two) times daily. 180 tablet 3   spironolactone (ALDACTONE) 25 MG tablet TAKE 1 TABLET DAILY 90 tablet 0   No current facility-administered medications for this visit.    No Known Allergies    Review of Systems negative except from HPI and PMH  Physical Exam BP 123/76   Pulse 61   Ht 5\' 10"  (1.778 m)   Wt 189 lb (85.7 kg)   SpO2 95%   BMI 27.12 kg/m  Well developed and well nourished in no acute distress HENT normal Neck supple with JVP-flat Clear Device pocket well healed; without hematoma or erythema.  There is no tethering  Regular rate and rhythm, no murmur Abd-soft with active BS No Clubbing cyanosis  edema Skin-warm and dry A & Oriented  Grossly normal sensory and motor function  ECG sinus at 11 -year-old 15/11/41 T wave inversions V3-  6 Unchanged from 4/24 Device function is normal. Programming changes   See Paceart for details      Assessment and Plan:   Ischemic cardiomyopathy    hyperlipidemia  Implantable defibrillator-[employer]    No symptoms of ischemia.  Continue aspirin and Lipitor  With his cardiomyopathy continue Entresto Jardiance spironolactone and carvedilol

## 2023-07-31 LAB — BASIC METABOLIC PANEL
BUN/Creatinine Ratio: 24 (ref 10–24)
BUN: 26 mg/dL (ref 8–27)
CO2: 18 mmol/L — ABNORMAL LOW (ref 20–29)
Calcium: 9.9 mg/dL (ref 8.6–10.2)
Chloride: 103 mmol/L (ref 96–106)
Creatinine, Ser: 1.09 mg/dL (ref 0.76–1.27)
Glucose: 106 mg/dL — ABNORMAL HIGH (ref 70–99)
Potassium: 4.7 mmol/L (ref 3.5–5.2)
Sodium: 138 mmol/L (ref 134–144)
eGFR: 76 mL/min/{1.73_m2} (ref >59)

## 2023-07-31 LAB — CBC
Hematocrit: 47.6 % (ref 37.5–51.0)
Hemoglobin: 15.4 g/dL (ref 13.0–17.7)
MCH: 30.8 pg (ref 26.6–33.0)
MCHC: 32.4 g/dL (ref 31.5–35.7)
MCV: 95 fL (ref 79–97)
Platelets: 247 10*3/uL (ref 150–450)
RBC: 5 x10E6/uL (ref 4.14–5.80)
RDW: 13.1 % (ref 11.6–15.4)
WBC: 9.5 10*3/uL (ref 3.4–10.8)

## 2023-08-01 NOTE — Patient Instructions (Signed)
Erroneous encounter

## 2023-08-05 ENCOUNTER — Other Ambulatory Visit: Payer: Self-pay | Admitting: Cardiovascular Disease

## 2023-08-05 MED ORDER — NITROGLYCERIN 0.4 MG SL SUBL: 0.4000 mg | SUBLINGUAL_TABLET | SUBLINGUAL | 1 refills | Status: DC | PRN

## 2023-08-05 NOTE — Telephone Encounter (Signed)
Requested Prescriptions   Signed Prescriptions Disp Refills   atorvastatin (LIPITOR) 80 MG tablet 90 tablet 1    Sig: TAKE 1 TABLET DAILY    Authorizing Provider: Lorine Bears A    Ordering User: Katrinka Blazing, Alianys Chacko L   ezetimibe (ZETIA) 10 MG tablet 90 tablet 1    Sig: TAKE 1 TABLET DAILY    Authorizing Provider: Lorine Bears A    Ordering User: Katrinka Blazing, Brytani Voth L   spironolactone (ALDACTONE) 25 MG tablet 90 tablet 1    Sig: TAKE 1 TABLET DAILY    Authorizing Provider: Lorine Bears A    Ordering User: Katrinka Blazing, Javarian Jakubiak L   nitroGLYCERIN (NITROSTAT) 0.4 MG SL tablet 25 tablet 1    Sig: Place 1 tablet (0.4 mg total) under the tongue every 5 (five) minutes as needed.    Authorizing Provider: Lorine Bears A    Ordering User: Guerry Minors

## 2023-09-03 ENCOUNTER — Ambulatory Visit (INDEPENDENT_AMBULATORY_CARE_PROVIDER_SITE_OTHER): Payer: Medicare (Managed Care)

## 2023-09-03 DIAGNOSIS — I429 Cardiomyopathy, unspecified: Secondary | ICD-10-CM

## 2023-09-05 LAB — CUP PACEART REMOTE DEVICE CHECK
Battery Remaining Longevity: 126 mo
Battery Remaining Percentage: 100 %
Brady Statistic RA Percent Paced: 0 %
Brady Statistic RV Percent Paced: 0 %
Date Time Interrogation Session: 20241112002100
HighPow Impedance: 44 Ohm
Implantable Lead Connection Status: 753985
Implantable Lead Connection Status: 753985
Implantable Lead Implant Date: 20101104
Implantable Lead Implant Date: 20101104
Implantable Lead Location: 753859
Implantable Lead Location: 753860
Implantable Lead Model: 158
Implantable Lead Model: 5076
Implantable Lead Serial Number: 301087
Implantable Pulse Generator Implant Date: 20210215
Lead Channel Impedance Value: 1348 Ohm
Lead Channel Impedance Value: 493 Ohm
Lead Channel Setting Pacing Amplitude: 2 V
Lead Channel Setting Pacing Amplitude: 2.5 V
Lead Channel Setting Pacing Pulse Width: 0.4 ms
Lead Channel Setting Sensing Sensitivity: 0.5 mV
Pulse Gen Serial Number: 218699

## 2023-09-27 NOTE — Progress Notes (Signed)
Remote ICD transmission.   

## 2023-10-23 DIAGNOSIS — M1612 Unilateral primary osteoarthritis, left hip: Secondary | ICD-10-CM

## 2023-10-23 HISTORY — DX: Unilateral primary osteoarthritis, left hip: M16.12

## 2023-12-03 ENCOUNTER — Ambulatory Visit (INDEPENDENT_AMBULATORY_CARE_PROVIDER_SITE_OTHER): Payer: Medicare HMO

## 2023-12-03 DIAGNOSIS — I429 Cardiomyopathy, unspecified: Secondary | ICD-10-CM | POA: Diagnosis not present

## 2023-12-03 DIAGNOSIS — I5022 Chronic systolic (congestive) heart failure: Secondary | ICD-10-CM

## 2023-12-04 LAB — CUP PACEART REMOTE DEVICE CHECK
Battery Remaining Longevity: 126 mo
Battery Remaining Percentage: 100 %
Brady Statistic RA Percent Paced: 0 %
Brady Statistic RV Percent Paced: 0 %
Date Time Interrogation Session: 20250211002000
HighPow Impedance: 44 Ohm
Implantable Lead Connection Status: 753985
Implantable Lead Connection Status: 753985
Implantable Lead Implant Date: 20101104
Implantable Lead Implant Date: 20101104
Implantable Lead Location: 753859
Implantable Lead Location: 753860
Implantable Lead Model: 158
Implantable Lead Model: 5076
Implantable Lead Serial Number: 301087
Implantable Pulse Generator Implant Date: 20210215
Lead Channel Impedance Value: 478 Ohm
Lead Channel Impedance Value: 550 Ohm
Lead Channel Setting Pacing Amplitude: 2 V
Lead Channel Setting Pacing Amplitude: 2.5 V
Lead Channel Setting Pacing Pulse Width: 0.4 ms
Lead Channel Setting Sensing Sensitivity: 0.5 mV
Pulse Gen Serial Number: 218699

## 2023-12-23 ENCOUNTER — Encounter: Payer: Self-pay | Admitting: Internal Medicine

## 2024-01-13 NOTE — Addendum Note (Signed)
 Addended by: Geralyn Flash D on: 01/13/2024 02:37 PM   Modules accepted: Orders

## 2024-01-13 NOTE — Progress Notes (Signed)
 Remote ICD transmission.

## 2024-01-14 ENCOUNTER — Other Ambulatory Visit: Payer: Self-pay | Admitting: Cardiovascular Disease

## 2024-03-03 ENCOUNTER — Ambulatory Visit (INDEPENDENT_AMBULATORY_CARE_PROVIDER_SITE_OTHER): Payer: Medicare HMO

## 2024-03-03 DIAGNOSIS — I429 Cardiomyopathy, unspecified: Secondary | ICD-10-CM | POA: Diagnosis not present

## 2024-03-04 ENCOUNTER — Ambulatory Visit: Payer: Self-pay | Admitting: Cardiology

## 2024-03-04 ENCOUNTER — Other Ambulatory Visit: Payer: Self-pay | Admitting: Physician Assistant

## 2024-03-04 LAB — CUP PACEART REMOTE DEVICE CHECK
Battery Remaining Longevity: 126 mo
Battery Remaining Percentage: 100 %
Brady Statistic RA Percent Paced: 0 %
Brady Statistic RV Percent Paced: 0 %
Date Time Interrogation Session: 20250513002100
HighPow Impedance: 41 Ohm
Implantable Lead Connection Status: 753985
Implantable Lead Connection Status: 753985
Implantable Lead Implant Date: 20101104
Implantable Lead Implant Date: 20101104
Implantable Lead Location: 753859
Implantable Lead Location: 753860
Implantable Lead Model: 158
Implantable Lead Model: 5076
Implantable Lead Serial Number: 301087
Implantable Pulse Generator Implant Date: 20210215
Lead Channel Impedance Value: 443 Ohm
Lead Channel Impedance Value: 482 Ohm
Lead Channel Setting Pacing Amplitude: 2 V
Lead Channel Setting Pacing Amplitude: 2.5 V
Lead Channel Setting Pacing Pulse Width: 0.4 ms
Lead Channel Setting Sensing Sensitivity: 0.5 mV
Pulse Gen Serial Number: 218699

## 2024-03-10 NOTE — Progress Notes (Signed)
 BP 126/78 (Cuff Size: Large)   Pulse 83   Temp 98 F (36.7 C) (Oral)   Resp 16   Ht 5\' 10"  (1.778 m)   Wt 188 lb 4.8 oz (85.4 kg)   SpO2 98%   BMI 27.02 kg/m    Subjective:    Patient ID: [name] [name], male    DOB: Jul 12, 1961, 63 y.o.   MRN: [id number]  HPI: [name] [name] is a 63 y.o. male  Chief Complaint  Patient presents with   Establish Care   Referral    Dermatology   Leg Pain    Discussed the use of AI scribe software for clinical note transcription with the patient, who gave verbal consent to proceed.  History of Present Illness [name] [name] is a 63 year old male with an extensive cardiac history who presents to establish care.  He has been without a primary care provider for almost four years and is late on his cardiology follow-up due to this issue. His cardiac history includes a low ejection fraction, ICD implantation in 2010, two CABG procedures, and mitral valve repair. His last cardiology visit was on July 30, 2023. Current medications include aspirin  81 mg daily, atorvastatin  80 mg daily, carvedilol  25 mg twice daily, Jardiance  10 mg daily, Zetia  10 mg daily, nitroglycerin  0.4 mg sublingual as needed, Entresto  24-26 mg twice daily, and spironolactone  25 mg daily. He is experiencing issues with medication costs, particularly with Entresto  and Jardiance .  He has a history of melanomas treated by freezing and is seeking a dermatology referral for a check-up due to past melanoma removals. Reports he does have a small lesion on his head he would like frozen off.   He experiences leg pain that began over three years ago, characterized by sudden onset inflammation in the right hip, significant pain, and loss of sleep for a week. The right hip is currently the most painful, and he has lost muscle mass. Meloxicam provided slight relief.  He reports neuropathy in both hands, affecting the thumb, index, and middle fingers, which started less than a year ago. Symptoms  include tingling and a sensation of the fingers being 'halfway asleep'.   His social history includes working as a Scientist, water quality and being currently disabled due to his cardiac condition. He has a history of being very active and has undergone multiple surgeries, including ACL replacement and knee and shoulder surgeries. No chest pain, dizziness, or shortness of breath.         03/11/2024    7:32 AM  Depression screen PHQ 2/9  Decreased Interest 0  Down, Depressed, Hopeless 0  PHQ - 2 Score 0    Relevant past medical, surgical, family and social history reviewed and updated as indicated. Interim medical history since our last visit reviewed. Allergies and medications reviewed and updated.  Review of Systems  Constitutional: Negative for fever or weight change.  Respiratory: Negative for cough and shortness of breath.   Cardiovascular: Negative for chest pain or palpitations.  Gastrointestinal: Negative for abdominal pain, no bowel changes.  Musculoskeletal: positive for gait problem or joint swelling. Positive for right hip pain Skin: Negative for rash.  Neurological: Negative for dizziness or headache.  No other specific complaints in a complete review of systems (except as listed in HPI above).      Objective:      BP 126/78 (Cuff Size: Large)   Pulse 83   Temp 98 F (36.7 C) (Oral)  Resp 16   Ht 5\' 10"  (1.778 m)   Wt 188 lb 4.8 oz (85.4 kg)   SpO2 98%   BMI 27.02 kg/m    Wt Readings from Last 3 Encounters:  03/11/24 188 lb 4.8 oz (85.4 kg)  07/30/23 189 lb (85.7 kg)  07/30/23 189 lb (85.7 kg)    Physical Exam Vitals reviewed.  Constitutional:      Appearance: Normal appearance.  HENT:     Head: Normocephalic.  Cardiovascular:     Rate and Rhythm: Normal rate and regular rhythm.  Pulmonary:     Effort: Pulmonary effort is normal.     Breath sounds: Normal breath sounds.  Musculoskeletal:        General: Normal range of motion.  Skin:     General: Skin is warm and dry.  Neurological:     General: No focal deficit present.     Mental Status: He is alert and oriented to person, place, and time. Mental status is at baseline.  Psychiatric:        Mood and Affect: Mood normal.        Behavior: Behavior normal.        Thought Content: Thought content normal.        Judgment: Judgment normal.       Results for orders placed or performed in visit on 03/03/24  CUP PACEART REMOTE DEVICE CHECK   Collection Time: 03/03/24 12:21 AM  Result Value Ref Range   Date Time Interrogation Session [phone]    Pulse Generator Manufacturer BOST    Pulse Gen Model D121 MOMENTUM EL ICD    Pulse Gen Serial Number [id number]    Clinic Name [facility]    Implantable Pulse Generator Type Implantable Cardiac Defibulator    Implantable Pulse Generator Implant Date [id number]    Implantable Lead Manufacturer [facility]    Implantable Lead Model 0158 Endotak Reliance    Implantable Lead Serial Number [id number]    Implantable Lead Implant Date [id number]    Implantable Lead Location Detail 1 APEX    Implantable Lead Location [id number]    Implantable Lead Connection Status [id number]    Implantable Lead Manufacturer [employer]M    Implantable Lead Model 5076 CapSureFix Novus    Implantable Lead Serial Number [id number]    Implantable Lead Implant Date [id number]    Implantable Lead Location Detail 1 APPENDAGE    Implantable Lead Location [id number]    Implantable Lead Connection Status [id number]    Lead Channel Setting Sensing Sensitivity 0.5 mV   Lead Channel Setting Sensing Adaptation Mode Adaptive Sensing    Lead Channel Setting Pacing Amplitude 2.0 V   Lead Channel Setting Pacing Pulse Width 0.4 ms   Lead Channel Setting Pacing Amplitude 2.5 V   Zone Setting Status Active    Zone Setting Status Active    Lead Channel Impedance Value 443 ohm   Lead Channel Impedance Value 482 ohm   HighPow Impedance 41 ohm   Battery Status BOS    Battery Remaining Longevity 126 mo    Battery Remaining Percentage 100 %   Brady Statistic RA Percent Paced 0 %   Brady Statistic RV Percent Paced 0 %          Assessment & Plan:   Problem List Items Addressed This Visit       Cardiovascular and Mediastinum   Essential hypertension   Relevant Medications   atorvastatin  (LIPITOR) 80 MG tablet   Other Relevant Orders   CBC with  Differential/Platelet   Comprehensive metabolic panel with GFR   Primary cardiomyopathy (HCC)   Relevant Medications   atorvastatin  (LIPITOR) 80 MG tablet   ATRIAL FIBRILLATION   Relevant Medications   atorvastatin  (LIPITOR) 80 MG tablet   Coronary artery disease involving native coronary artery of native heart without angina pectoris   Relevant Medications   atorvastatin  (LIPITOR) 80 MG tablet   Other Relevant Orders   Lipid panel   HFrEF (heart failure with reduced ejection fraction) (HCC)   Relevant Medications   atorvastatin  (LIPITOR) 80 MG tablet   Other Relevant Orders   Lipid panel   Bilateral carotid artery stenosis   Relevant Medications   atorvastatin  (LIPITOR) 80 MG tablet   Other Relevant Orders   Lipid panel   RESOLVED: Ischemic cardiomyopathy   Relevant Medications   atorvastatin  (LIPITOR) 80 MG tablet     Other   S/P CABG x 2   ICD (implantable cardioverter-defibrillator) in place   History of mitral valve repair   Relevant Orders   Lipid panel   Hyperlipidemia LDL goal <70   Relevant Medications   atorvastatin  (LIPITOR) 80 MG tablet   Other Relevant Orders   Lipid panel   Other Visit Diagnoses       Need for Tdap vaccination    -  Primary   Relevant Orders   Tdap vaccine greater than or equal to 7yo IM (Completed)     Need for pneumococcal vaccination         Encounter to establish care         Screening for diabetes mellitus       Relevant Orders   Comprehensive metabolic panel with GFR   Hemoglobin A1c     Screening for deficiency anemia       Relevant Orders   CBC with Differential/Platelet      Screening for prostate cancer       Relevant Orders   PSA     Encounter for hepatitis C screening test for low risk patient       Relevant Orders   Hepatitis C antibody     Screening for HIV without presence of risk factors       Relevant Orders   HIV Antibody (routine testing w rflx)     Skin lesion       Relevant Orders   Ambulatory referral to Dermatology     Right hip pain       Relevant Medications   meloxicam (MOBIC) 7.5 MG tablet   diclofenac Sodium (VOLTAREN ARTHRITIS PAIN) 1 % GEL   Other Relevant Orders   Ambulatory referral to Orthopedic Surgery     Numbness in both hands       Relevant Orders   CBC with Differential/Platelet   Comprehensive metabolic panel with GFR   Vitamin B12        Assessment and Plan Assessment & Plan Heart failure Chronic heart failure with ICD implantation in 2010. Ejection fraction has decreased over 15 years. Managed with multiple cardiac medications. Reports difficulty accessing primary care and cardiology follow-up, impacting medication management. Concerns about the cost of Entresto  and Jardiance  affecting adherence. - Continue current cardiac medications: aspirin  81 mg daily, atorvastatin  80 mg daily, carvedilol  25 mg twice daily, Jardiance  10 mg daily, Zetia  10 mg daily, nitroglycerin  0.4 mg sublingual as needed, Entresto  24/26 mg twice daily, spironolactone  25 mg daily. - Encourage cardiology follow-up for ongoing management and evaluation of ejection fraction. - Discuss financial assistance programs for  medication affordability, particularly for Entresto  and Jardiance .  Cardiomyopathy Cardiomyopathy contributing to heart failure. Managed with current cardiac medications and under cardiology care. Survived past surgical interventions despite low ejection fraction, indicating resilience. - Continue current cardiac medications as listed under heart failure. - Ensure cardiology follow-up for ongoing management.  Coronary artery  disease Chronic coronary artery disease with CABG x2 and mitral valve repair. Managed with current cardiac medications. History of atypical presentation and significant surgical history. - Continue current cardiac medications as listed under heart failure. - Ensure cardiology follow-up for ongoing management.  Hyperlipidemia Chronic hyperlipidemia managed with atorvastatin  80 mg daily and Zetia  10 mg daily. Concerns about medication costs and past adverse experiences with alternative statins, specifically Crestor . - Continue atorvastatin  80 mg daily and Zetia  10 mg daily. - Send atorvastatin  prescription to mail order pharmacy for cost efficiency. - Discuss potential financial assistance programs for medication affordability.  Neuropathy in hands Bilateral neuropathy in thumb, index, and middle fingers with onset less than a year ago. Symptoms include tingling and partial numbness. Differential includes possible nerve compression or other neuropathic processes. - Order blood work to evaluate potential underlying causes. - Consider referral to neurology if symptoms persist or worsen.  Right hip pain Chronic right hip pain with significant impact on mobility and quality of life. Onset was sudden and severe, with ongoing limitations. Differential includes musculoskeletal versus skeletal etiology. Interest in determining whether the pain is skeletal or muscular. - Refer to orthopedics for further evaluation and management. - Prescribe meloxicam for pain management. - Recommend trial of Voltaren gel for topical pain relief.  General Health Maintenance Establishing care and requires routine health maintenance. Discussed need for hepatitis C screening as part of routine health maintenance. - Order hepatitis C screening test.  Follow-up Requires follow-up for multiple health issues and medication management. - Ensure cardiology follow-up for heart failure and cardiomyopathy management. -  Follow-up with dermatology for skin evaluation due to history of melanomas. - Follow-up with orthopedics for right hip pain evaluation. - Schedule follow-up appointment for review of blood work results and ongoing management.        Follow up plan: Return in about 6 months (around 09/11/2024) for follow up, AWV.

## 2024-03-11 ENCOUNTER — Encounter: Payer: Self-pay | Admitting: Nurse Practitioner

## 2024-03-11 ENCOUNTER — Other Ambulatory Visit: Payer: Self-pay

## 2024-03-11 ENCOUNTER — Ambulatory Visit: Admitting: Nurse Practitioner

## 2024-03-11 VITALS — BP 126/78 | HR 83 | Temp 98.0°F | Resp 16 | Ht 70.0 in | Wt 188.3 lb

## 2024-03-11 DIAGNOSIS — I6523 Occlusion and stenosis of bilateral carotid arteries: Secondary | ICD-10-CM

## 2024-03-11 DIAGNOSIS — Z114 Encounter for screening for human immunodeficiency virus [HIV]: Secondary | ICD-10-CM | POA: Diagnosis not present

## 2024-03-11 DIAGNOSIS — I255 Ischemic cardiomyopathy: Secondary | ICD-10-CM

## 2024-03-11 DIAGNOSIS — I4891 Unspecified atrial fibrillation: Secondary | ICD-10-CM | POA: Diagnosis not present

## 2024-03-11 DIAGNOSIS — I429 Cardiomyopathy, unspecified: Secondary | ICD-10-CM

## 2024-03-11 DIAGNOSIS — I5020 Unspecified systolic (congestive) heart failure: Secondary | ICD-10-CM

## 2024-03-11 DIAGNOSIS — Z23 Encounter for immunization: Secondary | ICD-10-CM

## 2024-03-11 DIAGNOSIS — E785 Hyperlipidemia, unspecified: Secondary | ICD-10-CM | POA: Diagnosis not present

## 2024-03-11 DIAGNOSIS — Z9889 Other specified postprocedural states: Secondary | ICD-10-CM

## 2024-03-11 DIAGNOSIS — Z95810 Presence of automatic (implantable) cardiac defibrillator: Secondary | ICD-10-CM

## 2024-03-11 DIAGNOSIS — L989 Disorder of the skin and subcutaneous tissue, unspecified: Secondary | ICD-10-CM

## 2024-03-11 DIAGNOSIS — Z130 Encounter for screening for diseases of the blood and blood-forming organs and certain disorders involving the immune mechanism: Secondary | ICD-10-CM

## 2024-03-11 DIAGNOSIS — R200 Anesthesia of skin: Secondary | ICD-10-CM | POA: Diagnosis not present

## 2024-03-11 DIAGNOSIS — Z131 Encounter for screening for diabetes mellitus: Secondary | ICD-10-CM

## 2024-03-11 DIAGNOSIS — M25551 Pain in right hip: Secondary | ICD-10-CM

## 2024-03-11 DIAGNOSIS — I10 Essential (primary) hypertension: Secondary | ICD-10-CM | POA: Diagnosis not present

## 2024-03-11 DIAGNOSIS — Z1159 Encounter for screening for other viral diseases: Secondary | ICD-10-CM | POA: Diagnosis not present

## 2024-03-11 DIAGNOSIS — Z951 Presence of aortocoronary bypass graft: Secondary | ICD-10-CM

## 2024-03-11 DIAGNOSIS — Z7689 Persons encountering health services in other specified circumstances: Secondary | ICD-10-CM

## 2024-03-11 DIAGNOSIS — I2510 Atherosclerotic heart disease of native coronary artery without angina pectoris: Secondary | ICD-10-CM

## 2024-03-11 DIAGNOSIS — Z125 Encounter for screening for malignant neoplasm of prostate: Secondary | ICD-10-CM

## 2024-03-11 MED ORDER — DICLOFENAC SODIUM 1 % EX GEL: 2.0000 g | Freq: Four times a day (QID) | CUTANEOUS | 1 refills | Status: DC

## 2024-03-11 MED ORDER — MELOXICAM 7.5 MG PO TABS: 7.5000 mg | ORAL_TABLET | Freq: Every day | ORAL | 1 refills | Status: DC

## 2024-03-11 MED ORDER — ATORVASTATIN CALCIUM 80 MG PO TABS: 80.0000 mg | ORAL_TABLET | Freq: Every day | ORAL | 3 refills | Status: AC

## 2024-03-12 ENCOUNTER — Other Ambulatory Visit: Payer: Self-pay

## 2024-03-12 ENCOUNTER — Ambulatory Visit: Payer: Self-pay | Admitting: Nurse Practitioner

## 2024-03-12 ENCOUNTER — Ambulatory Visit

## 2024-03-12 DIAGNOSIS — Z23 Encounter for immunization: Secondary | ICD-10-CM

## 2024-03-12 DIAGNOSIS — E538 Deficiency of other specified B group vitamins: Secondary | ICD-10-CM

## 2024-03-12 LAB — LIPID PANEL
Cholesterol: 167 mg/dL (ref <200)
HDL: 46 mg/dL (ref >=40)
LDL Cholesterol (Calc): 98 mg/dL
Non-HDL Cholesterol (Calc): 121 mg/dL (ref <130)
Total CHOL/HDL Ratio: 3.6 (calc) (ref <5.0)
Triglycerides: 130 mg/dL (ref <150)

## 2024-03-12 LAB — COMPREHENSIVE METABOLIC PANEL WITH GFR
AG Ratio: 1.8 (calc) (ref 1.0–2.5)
ALT: 15 U/L (ref 9–46)
AST: 16 U/L (ref 10–35)
Albumin: 4.4 g/dL (ref 3.6–5.1)
Alkaline phosphatase (APISO): 68 U/L (ref 35–144)
BUN: 21 mg/dL (ref 7–25)
CO2: 24 mmol/L (ref 20–32)
Calcium: 9.4 mg/dL (ref 8.6–10.3)
Chloride: 107 mmol/L (ref 98–110)
Creat: 1.05 mg/dL (ref 0.70–1.35)
Globulin: 2.5 g/dL (ref 1.9–3.7)
Glucose, Bld: 96 mg/dL (ref 65–99)
Potassium: 4.7 mmol/L (ref 3.5–5.3)
Sodium: 139 mmol/L (ref 135–146)
Total Bilirubin: 0.9 mg/dL (ref 0.2–1.2)
Total Protein: 6.9 g/dL (ref 6.1–8.1)
eGFR: 79 mL/min/{1.73_m2} (ref >=60)

## 2024-03-12 LAB — HEMOGLOBIN A1C
Hgb A1c MFr Bld: 5.5 % (ref <5.7)
Mean Plasma Glucose: 111 mg/dL
eAG (mmol/L): 6.2 mmol/L

## 2024-03-12 LAB — CBC WITH DIFFERENTIAL/PLATELET
Absolute Lymphocytes: 1122 {cells}/uL (ref 850–3900)
Absolute Monocytes: 469 {cells}/uL (ref 200–950)
Basophils Absolute: 20 {cells}/uL (ref 0–200)
Basophils Relative: 0.3 %
Eosinophils Absolute: 143 {cells}/uL (ref 15–500)
Eosinophils Relative: 2.1 %
HCT: 42.9 % (ref 38.5–50.0)
Hemoglobin: 13.8 g/dL (ref 13.2–17.1)
MCH: 30.5 pg (ref 27.0–33.0)
MCHC: 32.2 g/dL (ref 32.0–36.0)
MCV: 94.9 fL (ref 80.0–100.0)
MPV: 11.3 fL (ref 7.5–12.5)
Monocytes Relative: 6.9 %
Neutro Abs: 5046 {cells}/uL (ref 1500–7800)
Neutrophils Relative %: 74.2 %
Platelets: 189 10*3/uL (ref 140–400)
RBC: 4.52 10*6/uL (ref 4.20–5.80)
RDW: 13.2 % (ref 11.0–15.0)
Total Lymphocyte: 16.5 %
WBC: 6.8 10*3/uL (ref 3.8–10.8)

## 2024-03-12 LAB — VITAMIN B12: Vitamin B-12: 182 pg/mL — ABNORMAL LOW (ref 200–1100)

## 2024-03-12 LAB — HIV ANTIBODY (ROUTINE TESTING W REFLEX): HIV 1&2 Ab, 4th Generation: NONREACTIVE

## 2024-03-12 LAB — PSA: PSA: 2.24 ng/mL (ref <=4.00)

## 2024-03-12 LAB — HEPATITIS C ANTIBODY: Hepatitis C Ab: NONREACTIVE

## 2024-03-12 MED ORDER — CYANOCOBALAMIN 1000 MCG/ML IJ SOLN
1000.0000 ug | Freq: Once | INTRAMUSCULAR | Status: AC
Start: 2024-03-12 — End: 2024-03-12
  Administered 2024-03-12: 1000 ug via INTRAMUSCULAR

## 2024-03-13 ENCOUNTER — Other Ambulatory Visit: Payer: Self-pay

## 2024-03-13 MED ORDER — NITROGLYCERIN 0.4 MG SL SUBL: 0.4000 mg | SUBLINGUAL_TABLET | SUBLINGUAL | 3 refills | Status: DC | PRN

## 2024-03-18 ENCOUNTER — Other Ambulatory Visit: Payer: Self-pay

## 2024-03-18 MED ORDER — NITROGLYCERIN 0.4 MG SL SUBL: 0.4000 mg | SUBLINGUAL_TABLET | SUBLINGUAL | 3 refills | Status: AC | PRN

## 2024-03-30 DIAGNOSIS — M25551 Pain in right hip: Secondary | ICD-10-CM | POA: Diagnosis not present

## 2024-03-30 DIAGNOSIS — M160 Bilateral primary osteoarthritis of hip: Secondary | ICD-10-CM | POA: Diagnosis not present

## 2024-03-30 DIAGNOSIS — G8929 Other chronic pain: Secondary | ICD-10-CM | POA: Diagnosis not present

## 2024-04-02 ENCOUNTER — Other Ambulatory Visit: Payer: Self-pay | Admitting: Surgery

## 2024-04-02 DIAGNOSIS — M1611 Unilateral primary osteoarthritis, right hip: Secondary | ICD-10-CM

## 2024-04-03 ENCOUNTER — Ambulatory Visit: Admitting: Physician Assistant

## 2024-04-09 ENCOUNTER — Ambulatory Visit
Admission: RE | Admit: 2024-04-09 | Discharge: 2024-04-09 | Disposition: A | Discharge status: Home / Self Care | Source: Ambulatory Visit | Attending: Surgery | Admitting: Surgery

## 2024-04-09 DIAGNOSIS — M1611 Unilateral primary osteoarthritis, right hip: Secondary | ICD-10-CM | POA: Insufficient documentation

## 2024-04-09 DIAGNOSIS — I70201 Unspecified atherosclerosis of native arteries of extremities, right leg: Secondary | ICD-10-CM | POA: Diagnosis not present

## 2024-04-17 DIAGNOSIS — M1612 Unilateral primary osteoarthritis, left hip: Secondary | ICD-10-CM | POA: Diagnosis not present

## 2024-04-17 DIAGNOSIS — M1611 Unilateral primary osteoarthritis, right hip: Secondary | ICD-10-CM | POA: Diagnosis not present

## 2024-04-17 NOTE — Progress Notes (Signed)
 Remote ICD transmission.

## 2024-04-17 NOTE — Addendum Note (Signed)
 Addended by: VICCI SELLER A on: 04/17/2024 02:51 PM   Modules accepted: Orders

## 2024-04-19 NOTE — Progress Notes (Unsigned)
 Cardiology Office Note    Date:  04/22/2024   ID:  [name] [name], DOB Sep 25, 1961, MRN [id number]  PCP:  [name] Mliss FALCON, FNP  Cardiologist:  Deatrice Cage, MD  Electrophysiologist:  Elspeth Sage, MD   Chief Complaint: Follow up  History of Present Illness:   [name] [name] is a 63 y.o. male with history of CAD status post two-vessel CABG and mitral valve repair in 2010, HFrEF secondary to ICM status post ICD for primary prevention with generator change out in 11/2019, prior apical mural thrombus status post Coumadin , carotid artery disease, HLD, and tobacco use who presents for follow-up of CAD and cardiomyopathy.   He underwent two-vessel CABG with LIMA to LAD and RIMA to RCA along with mitral valve repair in 2010.  Prior EF noted to be 20% with apical mural thrombus.  He underwent ICD implantation as part of the MADIT-RIT trial for primary prevention.  Echo in 01/2016 showed an improved EF of 40 to 45%, anterior and anteroseptal hypokinesis, mild aortic insufficiency, intact mitral valve repair, no significant pulmonary hypertension, and no evidence of apical mural thrombus.  Repeat echo, to evaluate his cardiomyopathy, in 05/2020, showed an EF of 35 to 40%, mildly dilated internal LV cavity size, mild LVH, grade 2 diastolic dysfunction, akinesis of the apical anteroseptal wall, anterior wall, and inferior wall, normal RV systolic function and ventricular cavity size, mildly elevated PASP at 40 mmHg, mildly dilated left atrium, prior repair of the mitral valve with mild regurgitation and no evidence of stenosis, and mild to moderate aortic valve insufficiency.  Given a slight reduction in his cardiomyopathy on this echo, he was transitioned from ACE inhibitor to Entresto .   He was seen in the office in 12/2020 and was doing well from a cardiac perspective.  He had ran out of his medications approximately 1 week prior.  Updated echo in 05/2021 showed an EF of 30 to 35%, mildly reduced RV systolic  function with normal ventricular cavity size, normal PASP, mildly to moderately dilated left atrium, prior repair of the mitral valve with mild regurgitation and no evidence of stenosis, mild to moderate aortic valve insufficiency, and an estimated right atrial pressure of 3 mmHg.  Given slight reduction in LV systolic function, he was initiated on Farxiga 10 mg daily.  Carotid artery ultrasound in 05/2021 showed a stable 1 to 39% bilateral ICA stenosis with bilateral antegrade flow of the vertebral arteries and normal flow hemodynamics in the bilateral subclavian arteries.   He was seen on 07/10/2021 and was doing well from a cardiac perspective.  It was noted he had been unable to fill SGLT2i, though was subsequently started on this after contacting his pharmacy.  He reported an approximate year-long history of myalgias and arthralgias, particularly involving the lower extremities.  Given this, we underwent a trial of holding atorvastatin  and ezetimibe .  With this, he only noted a minor/minimal improvement in his lower extremity discomfort, and reported when this discomfort initially began in September 2021 it was quite severe.  He was advised to resume atorvastatin  and ezetimibe .   He was seen in the office on 08/14/2021 and had resumed atorvastatin  and ezetimibe  without significant worsening in his hip discomfort.   He was seen in the office on 01/08/2022 and remained very active at baseline.  He had been without Jardiance  and Entresto  for several months leading up to that visit due to copayment at the pharmacy, with plans to resume them after his visit with us   following a reduction in his copayment.  Following resumption of maximally tolerated GDMT, he underwent echo on 04/03/2022 which demonstrated a persistent cardiomyopathy with an EF of 30 to 35%, hypokinesis of the mid to distal anterior and septal wall with akinetic and aneurysmal appearing periapical region, grade 2 diastolic dysfunction, no evidence  of LV thrombus, mildly reduced RV systolic function with mildly enlarged ventricular cavity size, moderately dilated left atrium, prior repair of the mitral valve with mild regurgitation, and mild aortic insufficiency.   He was last seen in the office in 04/2022 and had only been back on GDMT for 3.5 to 4 weeks prior to undergoing the above echo.  He noted significant improvement in his myalgias/arthralgias.  He was more active in this setting.  Repeat limited echo on 01/23/2023 showed an EF of 30 to 35%, global hypokinesis with akinesis of the left ventricular apical segment, normal RV systolic function, mild mitral regurgitation, and mild to moderate aortic insufficiency.  He was last seen by general cardiology in 01/2023 and continued to do well from a cardiac perspective, remaining active at baseline without cardiac limitation.  He was most recently seen by EP in 07/2023 with no changes in therapy.  He comes in doing well from a cardiac perspective and remains without symptoms of angina or cardiac decompensation.  No palpitations, dizziness, presyncope, or syncope.  Functional status is limited at this time by right hip pain.  He indicates he will also need to have his left hip replaced down the road as well.  Currently planning for a surgical date in late August for the right hip.  No significant lower extremity swelling, abdominal distention, progressive orthopnea, or early satiety.  Remains adherent and tolerating cardiac medications without off target effect.   Duke Activity Status Index: > 4 METs with main limitation being right hip pain. Revised Cardiac Risk Index: Moderate risk for noncardiac procedure   Labs independently reviewed: 02/2024 - A1c 5.5, TC 167, TG 130, HDL 46, LDL 98, BUN 21, serum creatinine 1.05, potassium 4.7, albumin 4.4, AST/ALT normal, Hgb 13.8, PLT 189  Past Medical History:  Diagnosis Date   Atrial fibrillation (HCC)    a. s/p CABG   Cardiac defibrillator in situ     MADIT RIT trial   Coronary artery disease    a. s/p 2-V CABG (LIMA-LAD, RIMA-RCA) 2010   Hyperlipidemia    Ischemic cardiomyopathy    a. prior EF 20%; b. TTE 4/17: EF 40-45%, ant/antoseptal HK, mild AI, intact mitral valve repair, no sig pulm htn, no evidence of apical thrombus    Mitral valve disease    a. s/p repair 2010 at time of bypass   Post-infarction apical thrombus (HCC)    a. s/p Coumadin     Past Surgical History:  Procedure Laterality Date   CARDIAC DEFIBRILLATOR PLACEMENT     [employer]   COLONOSCOPY WITH PROPOFOL  N/A 07/31/2016   Procedure: COLONOSCOPY WITH PROPOFOL ;  Surgeon: Gladis RAYMOND Mariner, MD;  Location: [facility] ENDOSCOPY;  Service: Endoscopy;  Laterality: N/A;   CORONARY ARTERY BYPASS GRAFT  2010   LIMA to LAD, RIMA to RCA and mitral valve repair   ICD GENERATOR CHANGEOUT N/A 12/07/2019   Procedure: ICD GENERATOR CHANGEOUT;  Surgeon: Fernande Elspeth BROCKS, MD;  Location: [facility] INVASIVE CV LAB;  Service: Cardiovascular;  Laterality: N/A;   KNEE SURGERY     MITRAL VALVE REPAIR  2010    Current Medications: Current Meds  Medication Sig   aspirin  EC 81 MG  tablet Take 1 tablet (81 mg total) by mouth daily.   atorvastatin  (LIPITOR) 80 MG tablet Take 1 tablet (80 mg total) by mouth daily.   carvedilol  (COREG ) 25 MG tablet TAKE 1 TABLET TWICE A DAY WITH MEALS   diclofenac  Sodium (VOLTAREN  ARTHRITIS PAIN) 1 % GEL Apply 2 g topically 4 (four) times daily.   empagliflozin  (JARDIANCE ) 10 MG TABS tablet Take 1 tablet (10 mg total) by mouth daily.   ezetimibe  (ZETIA ) 10 MG tablet TAKE 1 TABLET DAILY   meloxicam  (MOBIC ) 7.5 MG tablet Take 1 tablet (7.5 mg total) by mouth daily.   nitroGLYCERIN  (NITROSTAT ) 0.4 MG SL tablet Place 1 tablet (0.4 mg total) under the tongue every 5 (five) minutes as needed.   Omega-3 Fatty Acids (FISH OIL) 1200 MG CAPS Take 2,400 mg by mouth daily.    sacubitril-valsartan (ENTRESTO ) 24-26 MG Take 1 tablet by mouth 2 (two) times daily.   spironolactone   (ALDACTONE ) 25 MG tablet TAKE 1 TABLET EVERY DAY    Allergies:   Patient has no known allergies.   Social History   Socioeconomic History   Marital status: Widowed    Spouse name: Not on file   Number of children: Not on file   Years of education: Not on file   Highest education level: Bachelor's degree (e.g., BA, AB, BS)  Occupational History   Not on file  Tobacco Use   Smoking status: Former    Current packs/day: 0.00    Average packs/day: 1 pack/day for 37.0 years (37.0 ttl pk-yrs)    Types: Cigarettes    Start date: 05/02/1972    Quit date: 05/02/2009    Years since quitting: 14.9   Smokeless tobacco: Never  Vaping Use   Vaping status: Never Used  Substance and Sexual Activity   Alcohol use: No    Comment: ocassionally   Drug use: No   Sexual activity: Not on file  Other Topics Concern   Not on file  Social History Narrative   Not on file   Social Drivers of Health   Financial Resource Strain: Low Risk  (04/17/2024)   Received from [facility]   Overall Financial Resource Strain (CARDIA)    Difficulty of Paying Living Expenses: Not hard at all  Food Insecurity: No Food Insecurity (04/17/2024)   Received from [facility]   Hunger Vital Sign    Within the past 12 months, you worried that your food would run out before you got the money to buy more.: Never true    Within the past 12 months, the food you bought just didn't last and you didn't have money to get more.: Never true  Transportation Needs: No Transportation Needs (04/17/2024)   Received from [facility] - Transportation    In the past 12 months, has lack of transportation kept you from medical appointments or from getting medications?: No    Lack of Transportation (Non-Medical): No  Physical Activity: Unknown (03/09/2024)   Exercise Vital Sign    Days of Exercise per Week: 7 days    Minutes of Exercise per Session: Patient declined  Stress:  No Stress Concern Present (03/09/2024)   [employer] - Occupational Stress Questionnaire    Feeling of Stress : Only a little  Social Connections: Socially Isolated (03/09/2024)   Social Connection and Isolation Panel    Frequency of Communication with Friends and Family: Never    Frequency of Social  Gatherings with Friends and Family: Once a week    Attends Religious Services: Never    Database administrator or Organizations: No    Attends Engineer, structural: Not on file    Marital Status: Widowed     Family History:  The patient's family history includes Heart attack in his mother; Heart attack (age of onset: 46) in his father; Heart failure in his mother; Hyperlipidemia in an other family member.  ROS:   12-point review of systems is negative unless otherwise noted in the HPI.   EKGs/Labs/Other Studies Reviewed:    Studies reviewed were summarized above. The additional studies were reviewed today:  Limited echo 01/23/2023: 1. Left ventricular ejection fraction, by estimation, is 30 to 35%. The  left ventricle has moderately decreased function. The left ventricle  demonstrates global hypokinesis. There is akinesis of the left  ventricular, apical segment.   2. Right ventricular systolic function is normal.   3. Mild mitral valve regurgitation.   4. Aortic valve regurgitation is mild to moderate.   Comparison(s): 01/01/22 W Def. 30-35%. RWMA including aneurysmal apex. Dil  LV/RV, RV fxn down, MV Ring (from 2010), mild AI, LAE.  __________   2D echo 04/03/2022: 1. Left ventricular ejection fraction, by estimation, is 30 to 35%. The  left ventricle has moderately decreased function. The left ventricle  demonstrates regional wall motion abnormalities (Hypokinesis of teh mid to  distal anterior and septal wall with  akinetic and aneurysmal appearing periapical region). The left ventricular  internal cavity size was mildly dilated. Left  ventricular diastolic  parameters are consistent with Grade II diastolic dysfunction  (pseudonormalization).No LV thrombus noted.   2. Right ventricular systolic function is mildly reduced. The right  ventricular size is mildly enlarged.   3. Left atrial size was moderately dilated.   4. The mitral valve has been repaired/replaced. Mild mitral valve  regurgitation. Moderate mitral annular calcification. There is a  prosthetic annuloplasty ring present in the mitral position. Procedure  Date: 2010.   5. The aortic valve was not well visualized. Aortic valve regurgitation  is mild. __________   2D echo 05/2021:  1. Left ventricular ejection fraction, by estimation, is 30 to 35%. The  left ventricle has moderately decreased function. The left ventricle  demonstrates regional wall motion abnormalities (see scoring  diagram/findings for description). Left ventricular   diastolic function could not be evaluated.   2. Right ventricular systolic function is mildly reduced. The right  ventricular size is normal. There is normal pulmonary artery systolic  pressure.   3. Left atrial size was mild to moderately dilated.   4. The mitral valve has been repaired/replaced. Mild mitral valve  regurgitation. No evidence of mitral stenosis. There is a prosthetic  annuloplasty ring present in the mitral position. Procedure Date: 2010.   5. The aortic valve is tricuspid. Aortic valve regurgitation is mild to  moderate. No aortic stenosis is present.   6. The inferior vena cava is normal in size with greater than 50%  respiratory variability, suggesting right atrial pressure of 3 mmHg. __________   2D echo 05/2020: 1. Left ventricular ejection fraction, by estimation, is 35 to 40%. The  left ventricle has moderately decreased function. The left ventricle  demonstrates regional wall motion abnormalities (see scoring  diagram/findings for description). The left  ventricular internal cavity size was  mildly dilated. There is mild left  ventricular hypertrophy. Left ventricular diastolic parameters are  consistent with Grade II diastolic dysfunction (  pseudonormalization).  There is akinesis of the left ventricular,  apical anteroseptal wall, anterior wall and inferior wall.   2. Right ventricular systolic function is normal. The right ventricular  size is normal. There is mildly elevated pulmonary artery systolic  pressure. The estimated right ventricular systolic pressure is 40.0 mmHg.   3. Left atrial size was mildly dilated.   4. The mitral valve has been repaired/replaced. Mild mitral valve  regurgitation. No evidence of mitral stenosis.   5. The aortic valve is normal in structure. Aortic valve regurgitation is  mild to moderate. No aortic stenosis is present.   6. The inferior vena cava is normal in size with greater than 50%  respiratory variability, suggesting right atrial pressure of 3 mmHg.   Comparison(s): Most recent echo in 2017 showed an EF of 40-45%. __________   2D echo 01/2016: - Left ventricle: The cavity size was normal. Systolic function was    mildly to moderately reduced. The estimated ejection fraction was    in the range of 40% to 45%. Hypokinesis of the anterior,    anteroseptal and apical myocardium. Left ventricular diastolic    function parameters were normal.  - Aortic valve: There was mild regurgitation.  - Mitral valve: A mitral valve repair was present.  - Left atrium: The atrium was moderately dilated.  - Right ventricle: Pacer wire or catheter noted in right ventricle.    Systolic function was normal.  - Pulmonary arteries: Systolic pressure was within the normal    range.   Impressions:   - no apical thrombus noted.    EKG:  EKG is ordered today.  The EKG ordered today demonstrates NSR, 62 bpm, T wave inversion in V3 through V6, unchanged from prior tracing  Recent Labs: 03/11/2024: ALT 15; BUN 21; Creat 1.05; Hemoglobin 13.8; Platelets  189; Potassium 4.7; Sodium 139  Recent Lipid Panel    Component Value Date/Time   CHOL 167 03/11/2024 0816   CHOL 160 03/22/2020 1005   TRIG 130 03/11/2024 0816   HDL 46 03/11/2024 0816   HDL 42 03/22/2020 1005   CHOLHDL 3.6 03/11/2024 0816   VLDL 27 03/15/2021 1050   LDLCALC 98 03/11/2024 0816   LDLDIRECT 133 (H) 05/27/2018 1538    PHYSICAL EXAM:    VS:  BP (!) 96/58 (BP Location: Left Arm, Patient Position: Sitting)   Pulse 62   Ht 5' 10 (1.778 m)   Wt 184 lb 3.2 oz (83.6 kg)   SpO2 98%   BMI 26.43 kg/m   BMI: Body mass index is 26.43 kg/m.  Physical Exam Vitals reviewed.  Constitutional:      Appearance: He is well-developed.  HENT:     Head: Normocephalic and atraumatic.  Eyes:     General:        Right eye: No discharge.        Left eye: No discharge.  Cardiovascular:     Rate and Rhythm: Normal rate and regular rhythm.     Pulses:          Posterior tibial pulses are 2+ on the right side and 2+ on the left side.     Heart sounds: Normal heart sounds, S1 normal and S2 normal. Heart sounds not distant. No midsystolic click and no opening snap. No murmur heard.    No friction rub.  Pulmonary:     Effort: Pulmonary effort is normal. No respiratory distress.     Breath sounds: Normal breath sounds. No decreased breath  sounds, wheezing, rhonchi or rales.  Chest:     Chest wall: No tenderness.  Musculoskeletal:     Cervical back: Normal range of motion.     Right lower leg: No edema.     Left lower leg: No edema.  Skin:    General: Skin is warm and dry.     Nails: There is no clubbing.  Neurological:     Mental Status: He is alert and oriented to person, place, and time.  Psychiatric:        Speech: Speech normal.        Behavior: Behavior normal.        Thought Content: Thought content normal.        Judgment: Judgment normal.     Wt Readings from Last 3 Encounters:  04/22/24 184 lb 3.2 oz (83.6 kg)  03/11/24 188 lb 4.8 oz (85.4 kg)  07/30/23 189 lb  (85.7 kg)     ASSESSMENT & PLAN:   CAD status post CABG without angina: He continues to do very well and is without symptoms of angina or cardiac decompensation.  Continue current medical therapy including aspirin  81 mg, carvedilol  25 mg twice daily, atorvastatin  80 mg, ezetimibe  10 mg.  HFrEF secondary to ICM status post ICD: He is euvolemic and well compensated with NYHA class I symptoms.  No device discharges.  Most recent echo showed a stable cardiomyopathy with an EF of 30 to 35%.  Relative hypotension precludes escalation of GDMT with recommendation to continue current doses of carvedilol , Jardiance , Entresto , and spironolactone .  He remains unclear if he would be able to pursue cardiac MRI given his history of ICD, would appreciate EP input on this.  QRS not wide enough for CRT.  Follow-up with the EP as directed.  History of mitral valve repair: Stable with mild mitral regurgitation on echo in 01/2023.  Anticipate follow-up echo at next visit.  History of apical mural thrombus: Status post therapy with Coumadin .  Most recent echo showed no evidence of mural thrombus.  Carotid artery disease: Carotid artery ultrasound from 07/2022 showed stable 1 to 39% bilateral ICA stenosis.  Remains on aspirin  and statin as outlined above.  HLD: LDL 98 in 02/2024 with normal AST/ALT at that time.  Target LDL less than 70.  Reports adherence to atorvastatin  and ezetimibe .  Lifestyle modification.  If LDL remains above goal in follow-up will need to discuss PCSK9 inhibitor.  Preoperative cardiac risk stratification: Patient is planning to undergo right total hip arthroplasty with date to be determined.  Per Duke Activity Status Index, he can achieve > 4 METs without cardiac limitation.  Per Revised Cardiac Risk Index, he is moderate risk for noncardiac procedure with an estimated rate of 6.6% for adverse cardiac event in the periprocedural timeframe.  The patient does have a persistent cardiomyopathy, though  is stable and without symptoms of angina or cardiac decompensation.  No further cardiac testing will further reduce his periprocedural risk and he may proceed at an overall moderate risk.      Disposition: F/u with Dr. Darron or an APP in 6 months, and EP as directed.   Medication Adjustments/Labs and Tests Ordered: Current medicines are reviewed at length with the patient today.  Concerns regarding medicines are outlined above. Medication changes, Labs and Tests ordered today are summarized above and listed in the Patient Instructions accessible in Encounters.   Signed, Bernardino Bring, PA-C 04/22/2024 12:45 PM     [facility] [place] [address] [place], [place] [address] [phone]

## 2024-04-21 ENCOUNTER — Telehealth: Payer: Self-pay | Admitting: *Deleted

## 2024-04-21 NOTE — Telephone Encounter (Signed)
 Dr. Mal nurse [facility] left me a vm to call her back. I s/w with [facility] who tells me the anesthesia will either be spinal vs general. She states they will not know until the anesthesiologist decides.   I thanked her for her help. It just helps the preop APP and cardiologist to know which possible anesthesia agents amy be used.

## 2024-04-21 NOTE — Telephone Encounter (Signed)
   Pre-operative Risk Assessment    Patient Name: [name] [name]  DOB: 20-Jan-1961 MRN: [id number]   Date of last office visit: 07/30/23 DR.KLEIN Date of next office visit: 04/22/24 RYAN DUNN, PAC   Request for Surgical Clearance    Procedure:  RIGHT TOTAL HIP ARTHROPLASTY  Date of Surgery:  Clearance TBD                                Surgeon:  DR. ABERMAN Surgeon's Group or Practice Name:  [facility] Phone number:  [phone] Fax number:  [phone]   Type of Clearance Requested:   - Medical  - Pharmacy:  Hold Aspirin      Type of Anesthesia:  Not Indicated-LEFT MESSAGE FOR SURGERY SCHEDULER TO CALL BACK WITH TYPE OF ANESTHESIA TO BE USED   Additional requests/questions:    Signed, Zoei Amison   04/21/2024, 1:37 PM

## 2024-04-21 NOTE — Telephone Encounter (Signed)
   Name: [name] [name]  DOB: 22-Apr-1961  MRN: [id number]  Primary Cardiologist: Deatrice Cage, MD  Chart reviewed as part of pre-operative protocol coverage. The patient has an upcoming visit scheduled with Bernardino Bring, PA on 04/22/2024 at which time clearance can be addressed in case there are any issues that would impact surgical recommendations.  I added preop FYI to appointment note so that provider is aware to address at time of outpatient visit.  Per office protocol the cardiology provider should forward their finalized clearance decision and recommendations regarding antiplatelet therapy to the requesting party below.    I will route this message as FYI to requesting party and remove this message from the preop box as separate preop APP input not needed at this time.   Please call with any questions.  [name], Jackee Shove, NP  04/21/2024, 1:52 PM

## 2024-04-22 ENCOUNTER — Encounter: Payer: Self-pay | Admitting: Physician Assistant

## 2024-04-22 ENCOUNTER — Ambulatory Visit (INDEPENDENT_AMBULATORY_CARE_PROVIDER_SITE_OTHER)

## 2024-04-22 ENCOUNTER — Ambulatory Visit: Attending: Physician Assistant | Admitting: Physician Assistant

## 2024-04-22 VITALS — BP 96/58 | HR 62 | Ht 70.0 in | Wt 184.2 lb

## 2024-04-22 DIAGNOSIS — I2510 Atherosclerotic heart disease of native coronary artery without angina pectoris: Secondary | ICD-10-CM

## 2024-04-22 DIAGNOSIS — Z95810 Presence of automatic (implantable) cardiac defibrillator: Secondary | ICD-10-CM | POA: Diagnosis not present

## 2024-04-22 DIAGNOSIS — I6523 Occlusion and stenosis of bilateral carotid arteries: Secondary | ICD-10-CM | POA: Diagnosis not present

## 2024-04-22 DIAGNOSIS — Z9889 Other specified postprocedural states: Secondary | ICD-10-CM | POA: Diagnosis not present

## 2024-04-22 DIAGNOSIS — I10 Essential (primary) hypertension: Secondary | ICD-10-CM | POA: Diagnosis not present

## 2024-04-22 DIAGNOSIS — Z01810 Encounter for preprocedural cardiovascular examination: Secondary | ICD-10-CM

## 2024-04-22 DIAGNOSIS — Z951 Presence of aortocoronary bypass graft: Secondary | ICD-10-CM

## 2024-04-22 DIAGNOSIS — E538 Deficiency of other specified B group vitamins: Secondary | ICD-10-CM | POA: Diagnosis not present

## 2024-04-22 DIAGNOSIS — I5022 Chronic systolic (congestive) heart failure: Secondary | ICD-10-CM | POA: Diagnosis not present

## 2024-04-22 DIAGNOSIS — I255 Ischemic cardiomyopathy: Secondary | ICD-10-CM | POA: Diagnosis not present

## 2024-04-22 DIAGNOSIS — E785 Hyperlipidemia, unspecified: Secondary | ICD-10-CM

## 2024-04-22 MED ORDER — CYANOCOBALAMIN 1000 MCG/ML IJ SOLN
1000.0000 ug | Freq: Once | INTRAMUSCULAR | Status: AC
  Administered 2024-04-22: 1000 ug via INTRAMUSCULAR

## 2024-04-22 NOTE — Progress Notes (Signed)
 Patient is in office today for a nurse visit for B12 Injection. Patient Injection was given in the  Left deltoid. Patient tolerated injection well.

## 2024-04-22 NOTE — Patient Instructions (Signed)
 Medication Instructions:  Your physician recommends that you continue on your current medications as directed. Please refer to the Current Medication list given to you today.   *If you need a refill on your cardiac medications before your next appointment, please call your pharmacy*  Lab Work: None ordered at this time  If you have labs (blood work) drawn today and your tests are completely normal, you will receive your results only by: MyChart Message (if you have MyChart) OR A paper copy in the mail If you have any lab test that is abnormal or we need to change your treatment, we will call you to review the results.  Testing/Procedures: None ordered at this time   Follow-Up: At [facility], you and your health needs are our priority.  As part of our continuing mission to provide you with exceptional heart care, our providers are all part of one team.  This team includes your primary Cardiologist (physician) and Advanced Practice Providers or APPs (Physician Assistants and Nurse Practitioners) who all work together to provide you with the care you need, when you need it.  Your next appointment:   6 month(s)  Provider:   You may see Deatrice Cage, MD or Bernardino Bring, PA-C

## 2024-04-30 DIAGNOSIS — H5213 Myopia, bilateral: Secondary | ICD-10-CM | POA: Diagnosis not present

## 2024-05-07 ENCOUNTER — Encounter: Payer: Self-pay | Admitting: Physician Assistant

## 2024-05-07 ENCOUNTER — Ambulatory Visit: Admitting: Physician Assistant

## 2024-05-07 VITALS — BP 135/77 | HR 78

## 2024-05-07 DIAGNOSIS — L821 Other seborrheic keratosis: Secondary | ICD-10-CM | POA: Diagnosis not present

## 2024-05-07 DIAGNOSIS — D1801 Hemangioma of skin and subcutaneous tissue: Secondary | ICD-10-CM

## 2024-05-07 DIAGNOSIS — D485 Neoplasm of uncertain behavior of skin: Secondary | ICD-10-CM

## 2024-05-07 DIAGNOSIS — D229 Melanocytic nevi, unspecified: Secondary | ICD-10-CM

## 2024-05-07 DIAGNOSIS — D0362 Melanoma in situ of left upper limb, including shoulder: Secondary | ICD-10-CM

## 2024-05-07 DIAGNOSIS — L570 Actinic keratosis: Secondary | ICD-10-CM

## 2024-05-07 DIAGNOSIS — L578 Other skin changes due to chronic exposure to nonionizing radiation: Secondary | ICD-10-CM | POA: Diagnosis not present

## 2024-05-07 DIAGNOSIS — W908XXA Exposure to other nonionizing radiation, initial encounter: Secondary | ICD-10-CM | POA: Diagnosis not present

## 2024-05-07 DIAGNOSIS — Z1283 Encounter for screening for malignant neoplasm of skin: Secondary | ICD-10-CM | POA: Diagnosis not present

## 2024-05-07 DIAGNOSIS — L814 Other melanin hyperpigmentation: Secondary | ICD-10-CM

## 2024-05-07 DIAGNOSIS — D492 Neoplasm of unspecified behavior of bone, soft tissue, and skin: Secondary | ICD-10-CM

## 2024-05-07 DIAGNOSIS — L928 Other granulomatous disorders of the skin and subcutaneous tissue: Secondary | ICD-10-CM

## 2024-05-07 NOTE — Patient Instructions (Addendum)
 Important Information  Due to recent changes in healthcare laws, you may see results of your pathology and/or laboratory studies on MyChart before the doctors have had a chance to review them. We understand that in some cases there may be results that are confusing or concerning to you. Please understand that not all results are received at the same time and often the doctors may need to interpret multiple results in order to provide you with the best plan of care or course of treatment. Therefore, we ask that you please give [place] 2 business days to thoroughly review all your results before contacting the office for clarification. Should we see a critical lab result, you will be contacted sooner.   If You Need Anything After Your Visit  If you have any questions or concerns for your doctor, please call our main line at [phone] If no one answers, please leave a voicemail as directed and we will return your call as soon as possible. Messages left after 4 pm will be answered the following business day.   You may also send [place] a message via MyChart. We typically respond to MyChart messages within 1-2 business days.  For prescription refills, please ask your pharmacy to contact our office. Our fax number is [phone].  If you have an urgent issue when the clinic is closed that cannot wait until the next business day, you can page your doctor at the number below.    Please note that while we do our best to be available for urgent issues outside of office hours, we are not available 24/7.   If you have an urgent issue and are unable to reach [place], you may choose to seek medical care at your doctor's office, retail clinic, urgent care center, or emergency room.  If you have a medical emergency, please immediately call 911 or go to the emergency department. In the event of inclement weather, please call our main line at [phone] for an update on the status of any delays or  closures.  Dermatology Medication Tips: Please keep the boxes that topical medications come in in order to help keep track of the instructions about where and how to use these. Pharmacies typically print the medication instructions only on the boxes and not directly on the medication tubes.   If your medication is too expensive, please contact our office at [phone] or send [place] a message through MyChart.   We are unable to tell what your co-pay for medications will be in advance as this is different depending on your insurance coverage. However, we may be able to find a substitute medication at lower cost or fill out paperwork to get insurance to cover a needed medication.   If a prior authorization is required to get your medication covered by your insurance company, please allow [place] 1-2 business days to complete this process.  Drug prices often vary depending on where the prescription is filled and some pharmacies may offer cheaper prices.  The website www.goodrx.com contains coupons for medications through different pharmacies. The prices here do not account for what the cost may be with help from insurance (it may be cheaper with your insurance), but the website can give you the price if you did not use any insurance.  - You can print the associated coupon and take it with your prescription to the pharmacy.  - You may also stop by our office during regular business hours and pick up a GoodRx coupon card.  - If  you need your prescription sent electronically to a different pharmacy, notify our office through [facility] or by phone at [phone]    Skin Education :   I counseled the patient regarding the following: Sun screen (SPF 30 or greater) should be applied during peak UV exposure (between 10am and 2pm) and reapplied after exercise or swimming.  The ABCDEs of melanoma were reviewed with the patient, and the importance of monthly self-examination of moles was emphasized.  Should any moles change in shape or color, or itch, bleed or burn, pt will contact our office for evaluation sooner then their interval appointment.  Plan: Sunscreen Recommendations I recommended a broad spectrum sunscreen with a SPF of 30 or higher. I explained that SPF 30 sunscreens block approximately 97 percent of the sun's harmful rays. Sunscreens should be applied at least 15 minutes prior to expected sun exposure and then every 2 hours after that as long as sun exposure continues. If swimming or exercising sunscreen should be reapplied every 45 minutes to an hour after getting wet or sweating. One ounce, or the equivalent of a shot glass full of sunscreen, is adequate to protect the skin not covered by a bathing suit. I also recommended a lip balm with a sunscreen as well. Sun protective clothing can be used in lieu of sunscreen but must be worn the entire time you are exposed to the sun's rays.  Patient Handout: Wound Care for Skin Biopsy Site  Taking Care of Your Skin Biopsy Site  Proper care of the biopsy site is essential for promoting healing and minimizing scarring. This handout provides instructions on how to care for your biopsy site to ensure optimal recovery.  1. Cleaning the Wound:  Clean the biopsy site daily with gentle soap and water. Gently pat the area dry with a clean, soft towel. Avoid harsh scrubbing or rubbing the area, as this can irritate the skin and delay healing.  2. Applying Aquaphor and Bandage:  After cleaning the wound, apply a thin layer of Aquaphor ointment to the biopsy site. Cover the area with a sterile bandage to protect it from dirt, bacteria, and friction. Change the bandage daily or as needed if it becomes soiled or wet.  3. Continued Care for One Week:  Repeat the cleaning, Aquaphor application, and bandaging process daily for one week following the biopsy procedure. Keeping the wound clean and moist during this initial healing period will help  prevent infection and promote optimal healing.  4. Massaging Aquaphor into the Area:  ---After one week, discontinue the use of bandages but continue to apply Aquaphor to the biopsy site. ----Gently massage the Aquaphor into the area using circular motions. ---Massaging the skin helps to promote circulation and prevent the formation of scar tissue.   Additional Tips:  Avoid exposing the biopsy site to direct sunlight during the healing process, as this can cause hyperpigmentation or worsen scarring. If you experience any signs of infection, such as increased redness, swelling, warmth, or drainage from the wound, contact your healthcare provider immediately. Follow any additional instructions provided by your healthcare provider for caring for the biopsy site and managing any discomfort. Conclusion:  Taking proper care of your skin biopsy site is crucial for ensuring optimal healing and minimizing scarring. By following these instructions for cleaning, applying Aquaphor, and massaging the area, you can promote a smooth and successful recovery. If you have any questions or concerns about caring for your biopsy site, don't hesitate to contact your healthcare  provider for guidance.  For areas treated with Liquid Nitrogen:  Keep clean with soap and water.  Apply Vaseline or Aquaphor twice daily.

## 2024-05-07 NOTE — Progress Notes (Unsigned)
 New Patient Visit   Subjective  [name] [name] is a 63 y.o. male who presents for the following: Skin Cancer Screening and Full Body Skin Exam  The patient presents for Total-Body Skin Exam (TBSE) for skin cancer screening and mole check. The patient has spots, moles and lesions to be evaluated, some may be new or changing and the patient may have concern these could be cancer.    The following portions of the chart were reviewed this encounter and updated as appropriate: medications, allergies, medical history  Review of Systems:  No other skin or systemic complaints except as noted in HPI or Assessment and Plan.  Objective  Well appearing patient in no apparent distress; mood and affect are within normal limits.  A full examination was performed including scalp, head, eyes, ears, nose, lips, neck, chest, axillae, abdomen, back, buttocks, bilateral upper extremities, bilateral lower extremities, hands, feet, fingers, toes, fingernails, and toenails. All findings within normal limits unless otherwise noted below.   Relevant physical exam findings are noted in the Assessment and Plan.  Right Zygomatic Area 7mm erythematous scaly papule   Left Shoulder - Anterior 6mm  excoriated papule    Assessment & Plan   SKIN CANCER SCREENING PERFORMED TODAY.  ACTINIC DAMAGE - Chronic condition, secondary to cumulative UV/sun exposure - diffuse scaly erythematous macules with underlying dyspigmentation - Recommend daily broad spectrum sunscreen SPF 30+ to sun-exposed areas, reapply every 2 hours as needed.  - Staying in the shade or wearing long sleeves, sun glasses (UVA+UVB protection) and wide brim hats (4-inch brim around the entire circumference of the hat) are also recommended for sun protection.  - Call for new or changing lesions.  LENTIGINES, SEBORRHEIC KERATOSES, HEMANGIOMAS - Benign normal skin lesions - Benign-appearing - Call for any changes  MELANOCYTIC NEVI - Tan-brown  and/or pink-flesh-colored symmetric macules and papules - Benign appearing on exam today - Observation - Call clinic for new or changing moles - Recommend daily use of broad spectrum spf 30+ sunscreen to sun-exposed areas.   ACTINIC KERATOSIS Exam: Erythematous thin papules/macules with gritty scale  Actinic keratoses are precancerous spots that appear secondary to cumulative UV radiation exposure/sun exposure over time. They are chronic with expected duration over 1 year. A portion of actinic keratoses will progress to squamous cell carcinoma of the skin. It is not possible to reliably predict which spots will progress to skin cancer and so treatment is recommended to prevent development of skin cancer.  Recommend daily broad spectrum sunscreen SPF 30+ to sun-exposed areas, reapply every 2 hours as needed.  Recommend staying in the shade or wearing long sleeves, sun glasses (UVA+UVB protection) and wide brim hats (4-inch brim around the entire circumference of the hat). Call for new or changing lesions.  Treatment Plan:  Prior to procedure, discussed risks of blister formation, small wound, skin dyspigmentation, or rare scar following cryotherapy. Recommend Vaseline ointment to treated areas while healing.  Destruction Procedure Note Destruction method: cryotherapy   Informed consent: discussed and consent obtained   Lesion destroyed using liquid nitrogen: Yes   Outcome: patient tolerated procedure well with no complications   Post-procedure details: wound care instructions given   Locations: left helix  # of Lesions Treated: 1      NEOPLASM OF UNCERTAIN BEHAVIOR OF SKIN (2) Right Zygomatic Area Skin / nail biopsy Type of biopsy: tangential   Informed consent: discussed and consent obtained   Timeout: patient name, date of birth, surgical site, and procedure verified  Procedure prep:  Patient was prepped and draped in usual sterile fashion Prep type:  Isopropyl  alcohol Anesthesia: the lesion was anesthetized in a standard fashion   Anesthetic:  1% lidocaine  w/ epinephrine 1-100,000 buffered w/ 8.4% NaHCO3 Instrument used: DermaBlade   Hemostasis achieved with: aluminum chloride   Outcome: patient tolerated procedure well   Post-procedure details: sterile dressing applied and wound care instructions given   Dressing type: petrolatum gauze and bandage    Specimen 1 - Surgical pathology Differential Diagnosis: r/o scc vs ak  Check Margins: No Left Shoulder - Anterior Skin / nail biopsy Type of biopsy: tangential   Informed consent: discussed and consent obtained   Timeout: patient name, date of birth, surgical site, and procedure verified   Procedure prep:  Patient was prepped and draped in usual sterile fashion Prep type:  Isopropyl alcohol Anesthesia: the lesion was anesthetized in a standard fashion   Anesthetic:  1% lidocaine  w/ epinephrine 1-100,000 buffered w/ 8.4% NaHCO3 Instrument used: DermaBlade   Hemostasis achieved with: aluminum chloride   Outcome: patient tolerated procedure well   Post-procedure details: sterile dressing applied and wound care instructions given   Dressing type: petrolatum gauze and bandage    Specimen 2 - Surgical pathology Differential Diagnosis: r/o DN vs mm   Check Margins: No  Return in about 1 year (around 05/07/2025) for TBSC.  I, Berwyn Lesches, Surg Tech III, am acting as scribe for Mozelle Remlinger K, PA-C.   Documentation: I have reviewed the above documentation for accuracy and completeness, and I agree with the above.  Sophie Tamez K, PA-C

## 2024-05-11 ENCOUNTER — Ambulatory Visit: Payer: Self-pay | Admitting: Physician Assistant

## 2024-05-11 LAB — SURGICAL PATHOLOGY

## 2024-05-29 ENCOUNTER — Ambulatory Visit (INDEPENDENT_AMBULATORY_CARE_PROVIDER_SITE_OTHER)

## 2024-05-29 DIAGNOSIS — E538 Deficiency of other specified B group vitamins: Secondary | ICD-10-CM | POA: Diagnosis not present

## 2024-05-29 MED ORDER — CYANOCOBALAMIN 1000 MCG/ML IJ SOLN
1000.0000 ug | Freq: Once | INTRAMUSCULAR | Status: AC
Start: 2024-05-29 — End: 2024-05-29
  Administered 2024-05-29: 1000 ug via INTRAMUSCULAR

## 2024-05-29 NOTE — Progress Notes (Signed)
 Patient is in office today for a nurse visit for B12 Injection. Patient Injection was given in the  Left deltoid. Patient tolerated injection well.

## 2024-06-02 ENCOUNTER — Other Ambulatory Visit: Payer: Self-pay | Admitting: Orthopedic Surgery

## 2024-06-02 ENCOUNTER — Encounter: Payer: Self-pay | Admitting: Dermatology

## 2024-06-02 ENCOUNTER — Ambulatory Visit: Payer: Medicare HMO

## 2024-06-02 DIAGNOSIS — M1611 Unilateral primary osteoarthritis, right hip: Secondary | ICD-10-CM | POA: Diagnosis not present

## 2024-06-03 ENCOUNTER — Ambulatory Visit: Admitting: Dermatology

## 2024-06-03 ENCOUNTER — Encounter: Payer: Self-pay | Admitting: Dermatology

## 2024-06-03 VITALS — BP 120/70 | HR 73 | Temp 97.9°F

## 2024-06-03 DIAGNOSIS — L579 Skin changes due to chronic exposure to nonionizing radiation, unspecified: Secondary | ICD-10-CM | POA: Diagnosis not present

## 2024-06-03 DIAGNOSIS — D0362 Melanoma in situ of left upper limb, including shoulder: Secondary | ICD-10-CM | POA: Diagnosis not present

## 2024-06-03 DIAGNOSIS — L814 Other melanin hyperpigmentation: Secondary | ICD-10-CM | POA: Diagnosis not present

## 2024-06-03 DIAGNOSIS — C439 Malignant melanoma of skin, unspecified: Secondary | ICD-10-CM

## 2024-06-03 NOTE — Progress Notes (Signed)
 Follow-Up Visit   Subjective  [name] [name] is a 63 y.o. male who presents for the following: Mohs of a Melanoma in Situ of the left shoulder-anterior, referred by Erminio Like, PA-C.  The following portions of the chart were reviewed this encounter and updated as appropriate: medications, allergies, medical history  Review of Systems:  No other skin or systemic complaints except as noted in HPI or Assessment and Plan.  Objective  Well appearing patient in no apparent distress; mood and affect are within normal limits.  A focused examination was performed of the following areas: Left shoulder-anterior Relevant physical exam findings are noted in the Assessment and Plan.   Left Shoulder - Anterior Healing biopsy site    Assessment & Plan   MALIGNANT MELANOMA OF SKIN (HCC) Left Shoulder - Anterior Mohs surgery  Consent obtained: written  Anticoagulation: Was the anticoagulation regimen changed prior to Mohs? No    Anesthesia: Anesthesia method: local infiltration Local anesthetic: lidocaine  1% WITH epi  Procedure Details: Timeout: pre-procedure verification complete Procedure Prep: patient was prepped and draped in usual sterile fashion Biopsy accession number: [id number] Pre-Op diagnosis: melanoma Melanoma subtype: in situ MohsAIQ Surgical site (if tumor spans multiple areas, please select predominant area): trunk (excluding nipple/areola) Surgery side: left Surgical site (from skin exam): Left Shoulder - Anterior Pre-operative length (cm): 1.5 Pre-operative width (cm): 0.7 Indications for Mohs surgery: anatomic location where tissue conservation is critical and aggressive histology  Micrographic Surgery Details: Post-operative length (cm): 3.1 Post-operative width (cm): 1.8 Number of Mohs stages: 1 Cumulative additional sections past 5 per stage: 0 Post surgery depth of defect: subcutaneous fat  Stage 1    Tumor features identified on Mohs  section: no tumor identified  Reconstruction: Was the defect reconstructed? Yes   Was reconstruction performed by the same Mohs surgeon? Yes   Setting of reconstruction: outpatient office When was reconstruction performed? same day Type of reconstruction: linear  Skin repair Complexity:  Complex Final length (cm):  5.4 Informed consent: discussed and consent obtained   Timeout: patient name, date of birth, surgical site, and procedure verified   Procedure prep:  Patient was prepped and draped in usual sterile fashion Prep type:  Chlorhexidine Anesthesia: the lesion was anesthetized in a standard fashion   Anesthetic:  1% lidocaine  w/ epinephrine 1-100,000 buffered w/ 8.4% NaHCO3 Reason for type of repair: reduce tension to allow closure, preserve normal anatomy, preserve normal anatomical and functional relationships, avoid adjacent structures and compensate for the inelasticity of skin in this area   Undermining: area extensively undermined   Subcutaneous layers (deep stitches):  Suture size:  3-0 (with dermabond and steri strips) Suture type: PDS (polydioxanone)   Stitches:  Buried vertical mattress Fine/surface layer approximation (top stitches):  Suture type: cyanoacrylate tissue glue   Hemostasis achieved with: suture, pressure and electrodesiccation Outcome: patient tolerated procedure well with no complications   Post-procedure details: sterile dressing applied and wound care instructions given   Dressing type: bandage and pressure dressing      Return in about 4 weeks (around 07/01/2024) for mohs follow up.  LILLETTE [name] [name], CMA, am acting as scribe for RUFUS CHRISTELLA HOLY, MD.    06/03/2024  HISTORY OF PRESENT ILLNESS  [name] [name] is seen in consultation at the request of Erminio Like, PA-C for biopsy-proven Melanoma in Situ of the left shoulder. They note that the area has been present for about 6 months increasing in size with time.  There is no history  of  previous treatment.  Reports no other new or changing lesions and has no other complaints today.  Medications and allergies: see patient chart.  Review of systems: Reviewed 8 systems and notable for the above skin cancer.  All other systems reviewed are unremarkable/negative, unless noted in the HPI. Past medical history, surgical history, family history, social history were also reviewed and are noted in the chart/questionnaire.    PHYSICAL EXAMINATION  General: Well-appearing, in no acute distress, alert and oriented x 4. Vitals reviewed in chart (if available).   Skin: Exam reveals a 1.5 x 0.7 cm erythematous papule and biopsy scar on the left shoulder. There are rhytids, telangiectasias, and lentigines, consistent with photodamage.  Biopsy report(s) reviewed, confirming the diagnosis.   ASSESSMENT  1) Melanoma in Situ of the left shoulder 2) photodamage 3) solar lentigines   PLAN   1. Due to location, size, histology, or recurrence and the likelihood of subclinical extension as well as the need to conserve normal surrounding tissue, the patient was deemed acceptable for Mohs micrographic surgery (MMS).  The nature and purpose of the procedure, associated benefits and risks including recurrence and scarring, possible complications such as pain, infection, and bleeding, and alternative methods of treatment if appropriate were discussed with the patient during consent. The lesion location was verified by the patient, by reviewing previous notes, pathology reports, and by photographs as well as angulation measurements if available.  Informed consent was reviewed and signed by the patient, and timeout was performed at 8:15 AM. See op note below.  2. For the photodamage and solar lentigines, sun protection discussed/information given on OTC sunscreens, and we recommend continued regular follow-up with primary dermatologist every 6 months or sooner for any growing, bleeding, or changing  lesions. 3. Prognosis and future surveillance discussed. 4. Letter with treatment outcome sent to referring provider. 5. Pain acetaminophen /ibuprofen  MOHS MICROGRAPHIC SURGERY AND RECONSTRUCTION  Initial size:   1.5 x 0.7 cm Surgical defect/wound size: 3.1 x 1.8 cm Anesthesia:    0.33% lidocaine  with 1:200,000 epinephrine EBL:    <5 mL Complications:  None Repair type:   Complex SQ suture:   3-0 PDS Cutaneous suture:  Cyanoacrylate and Steristrips Final size of the repair: 5.4 cm  Stages: 1  STAGE I: Anesthesia achieved with 0.5% lidocaine  with 1:200,000 epinephrine. ChloraPrep applied. 2 section(s) excised using Mohs technique (this includes total peripheral and deep tissue margin excision and evaluation with frozen sections, excised and interpreted by the same physician). The tumor was first debulked and then excised with an approx. 2mm margin.  Hemostasis was achieved with electrocautery as needed.  The specimen was then oriented, subdivided/relaxed, inked, and processed using Mohs technique.  Tissue was stained with H&E and MART-1 with 2 chromogens (2 immunostains).  Frozen section analysis revealed a clear deep and peripheral margin.   Reconstruction  The surgical wound was then cleaned, prepped, and re-anesthetized as above. Wound edges were undermined extensively along at least one entire edge and at a distance equal to or greater than the width of the defect (see wound defect size above) in order to achieve closure and decrease wound tension and anatomic distortion. Redundant tissue repair including standing cone removal was performed. Hemostasis was achieved with electrocautery. Subcutaneous and epidermal tissues were approximated with the above sutures. The surgical site was then lightly scrubbed with sterile, saline-soaked gauze. Steri-strips were applied, and the area was then bandaged using Vaseline ointment, non-adherent gauze, gauze pads, and tape to provide an adequate  pressure dressing. The patient tolerated the procedure well, was given detailed written and verbal wound care instructions, and was discharged in good condition.   The patient will follow-up: 4 weeks.    Documentation: I have reviewed the above documentation for accuracy and completeness, and I agree with the above.  RUFUS CHRISTELLA HOLY, MD

## 2024-06-03 NOTE — Patient Instructions (Signed)

## 2024-06-05 NOTE — Patient Instructions (Addendum)
 Your procedure is scheduled on:06-18-24 Thursday Report to the Registration Desk on the 1st floor of the [facility].Then proceed to the 2nd floor Surgery Desk To find out your arrival time, please call [phone] between 1PM - 3PM on:06-17-24 Wednesday If your arrival time is 6:00 am, do not arrive before that time as the [facility] entrance doors do not open until 6:00 am.  REMEMBER: Instructions that are not followed completely may result in serious medical risk, up to and including death; or upon the discretion of your surgeon and anesthesiologist your surgery may need to be rescheduled.  Do not eat food after midnight the night before surgery.  No gum chewing or hard candies.  You may however, drink CLEAR liquids up to 2 hours before you are scheduled to arrive for your surgery. Do not drink anything within 2 hours of your scheduled arrival time.  Clear liquids include: - water  - apple juice without pulp - gatorade (not RED colors) - black coffee or tea (Do NOT add milk or creamers to the coffee or tea) Do NOT drink anything that is not on this list.  In addition, your doctor has ordered for you to drink the provided:  Ensure Pre-Surgery Clear Carbohydrate Drink  Drinking this carbohydrate drink up to two hours before surgery helps to reduce insulin resistance and improve patient outcomes. Please complete drinking 2 hours before scheduled arrival time.  One week prior to surgery:Last dose will be on 06-10-24 Stop Anti-inflammatories (NSAIDS) such as meloxicam  (MOBIC )  Advil, Aleve, Ibuprofen, Motrin, Naproxen, Naprosyn and Aspirin  based products such as Excedrin, Goody's Powder, BC Powder. Stop ANY OVER THE COUNTER supplements until after surgery (Fish Oil)  You may however, continue to take Tylenol  if needed for pain up until the day of surgery.  Stop empagliflozin  (JARDIANCE ) 3 days prior to surgery-Last dose will be on 06-14-24 Sunday  Continue taking all of your other  prescription medications up until the day of surgery.  ON THE DAY OF SURGERY ONLY TAKE THESE MEDICATIONS WITH SIPS OF WATER: -carvedilol  (COREG )  -ezetimibe  (ZETIA )  -atorvastatin  (LIPITOR )   Last dose of your 162 mg of Aspirin  will be on 06-10-24 Wednesday. Start an 81 mg Aspirin  on 06-11-24 Thursday and continue the 81 mg Aspirin  up until the day prior to surgery-Do NOT take Aspirin  the day of surgery  No Alcohol for 24 hours before or after surgery.  No Smoking including e-cigarettes for 24 hours before surgery.  No chewable tobacco products for at least 6 hours before surgery.  No nicotine patches on the day of surgery.  Do not use any recreational drugs for at least a week (preferably 2 weeks) before your surgery.  Please be advised that the combination of cocaine and anesthesia may have negative outcomes, up to and including death. If you test positive for cocaine, your surgery will be cancelled.  On the morning of surgery brush your teeth with toothpaste and water, you may rinse your mouth with mouthwash if you wish. Do not swallow any toothpaste or mouthwash.  Use CHG Soap as directed on instruction sheet.  Do not wear jewelry, make-up, hairpins, clips or nail polish.  For welded (permanent) jewelry: bracelets, anklets, waist bands, etc.  Please have this removed prior to surgery.  If it is not removed, there is a chance that hospital personnel will need to cut it off on the day of surgery.  Do not wear lotions, powders, or perfumes.   Do not shave body  hair from the neck down 48 hours before surgery.  Contact lenses, hearing aids and dentures may not be worn into surgery.  Do not bring valuables to the hospital. [facility] is not responsible for any missing/lost belongings or valuables.   Notify your doctor if there is any change in your medical condition (cold, fever, infection).  Wear comfortable clothing (specific to your surgery type) to the hospital.  After  surgery, you can help prevent lung complications by doing breathing exercises.  Take deep breaths and cough every 1-2 hours. Your doctor may order a device called an Incentive Spirometer to help you take deep breaths. When coughing or sneezing, hold a pillow firmly against your incision with both hands. This is called "splinting." Doing this helps protect your incision. It also decreases belly discomfort.  If you are being admitted to the hospital overnight, leave your suitcase in the car. After surgery it may be brought to your room.  In case of increased patient census, it may be necessary for you, the patient, to continue your postoperative care in the Same Day Surgery department.  If you are being discharged the day of surgery, you will not be allowed to drive home. You will need a responsible individual to drive you home and stay with you for 24 hours after surgery.   If you are taking public transportation, you will need to have a responsible individual with you.  Please call the Pre-admissions Testing Dept. at [phone] if you have any questions about these instructions.  Surgery Visitation Policy:  Patients having surgery or a procedure may have two visitors.  Children under the age of 48 must have an adult with them who is not the patient.  Inpatient Visitation:    Visiting hours are 7 a.m. to 8 p.m. Up to four visitors are allowed at one time in a patient room. The visitors may rotate out with other people during the day.  One visitor age 47 or older may stay with the patient overnight and must be in the room by 8 p.m.    Pre-operative 5 CHG Bath Instructions   You can play a key role in reducing the risk of infection after surgery. Your skin needs to be as free of germs as possible. You can reduce the number of germs on your skin by washing with CHG (chlorhexidine gluconate) soap before surgery. CHG is an antiseptic soap that kills germs and continues to kill germs even  after washing.   DO NOT use if you have an allergy to chlorhexidine/CHG or antibacterial soaps. If your skin becomes reddened or irritated, stop using the CHG and notify one of our RNs at [phone].   Please shower with the CHG soap starting 4 days before surgery using the following schedule:     Please keep in mind the following:  DO NOT shave, including legs and underarms, starting the day of your first shower.   You may shave your face at any point before/day of surgery.  Place clean sheets on your bed the day you start using CHG soap. Use a clean washcloth (not used since being washed) for each shower. DO NOT sleep with pets once you start using the CHG.   CHG Shower Instructions:  If you choose to wash your hair and private area, wash first with your normal shampoo/soap.  After you use shampoo/soap, rinse your hair and body thoroughly to remove shampoo/soap residue.  Turn the water OFF and apply about 3 tablespoons (45  ml) of CHG soap to a CLEAN washcloth.  Apply CHG soap ONLY FROM YOUR NECK DOWN TO YOUR TOES (washing for 3-5 minutes)  DO NOT use CHG soap on face, private areas, open wounds, or sores.  Pay special attention to the area where your surgery is being performed.  If you are having back surgery, having someone wash your back for you may be helpful. Wait 2 minutes after CHG soap is applied, then you may rinse off the CHG soap.  Pat dry with a clean towel  Put on clean clothes/pajamas   If you choose to wear lotion, please use ONLY the CHG-compatible lotions on the back of this paper.     Additional instructions for the day of surgery: DO NOT APPLY any lotions, deodorants, cologne, or perfumes.   Put on clean/comfortable clothes.  Brush your teeth.  Ask your nurse before applying any prescription medications to the skin.      CHG Compatible Lotions   Aveeno Moisturizing lotion  Cetaphil Moisturizing Cream  Cetaphil Moisturizing Lotion  Clairol Herbal  Essence Moisturizing Lotion, Dry Skin  Clairol Herbal Essence Moisturizing Lotion, Extra Dry Skin  Clairol Herbal Essence Moisturizing Lotion, Normal Skin  Curel Age Defying Therapeutic Moisturizing Lotion with Alpha Hydroxy  Curel Extreme Care Body Lotion  Curel Soothing Hands Moisturizing Hand Lotion  Curel Therapeutic Moisturizing Cream, Fragrance-Free  Curel Therapeutic Moisturizing Lotion, Fragrance-Free  Curel Therapeutic Moisturizing Lotion, Original Formula  Eucerin Daily Replenishing Lotion  Eucerin Dry Skin Therapy Plus Alpha Hydroxy Crme  Eucerin Dry Skin Therapy Plus Alpha Hydroxy Lotion  Eucerin Original Crme  Eucerin Original Lotion  Eucerin Plus Crme Eucerin Plus Lotion  Eucerin TriLipid Replenishing Lotion  Keri Anti-Bacterial Hand Lotion  Keri Deep Conditioning Original Lotion Dry Skin Formula Softly Scented  Keri Deep Conditioning Original Lotion, Fragrance Free Sensitive Skin Formula  Keri Lotion Fast Absorbing Fragrance Free Sensitive Skin Formula  Keri Lotion Fast Absorbing Softly Scented Dry Skin Formula  Keri Original Lotion  Keri Skin Renewal Lotion Keri Silky Smooth Lotion  Keri Silky Smooth Sensitive Skin Lotion  Nivea Body Creamy Conditioning Oil  Nivea Body Extra Enriched Lotion  Nivea Body Original Lotion  Nivea Body Sheer Moisturizing Lotion Nivea Crme  Nivea Skin Firming Lotion  NutraDerm 30 Skin Lotion  NutraDerm Skin Lotion  NutraDerm Therapeutic Skin Cream  NutraDerm Therapeutic Skin Lotion  ProShield Protective Hand Cream  Provon moisturizing lotion  How to Use an Incentive Spirometer An incentive spirometer is a tool that measures how well you are filling your lungs with each breath. Learning to take long, deep breaths using this tool can help you keep your lungs clear and active. This may help to reverse or lessen your chance of developing breathing (pulmonary) problems, especially infection. You may be asked to use a spirometer: After a  surgery. If you have a lung problem or a history of smoking. After a long period of time when you have been unable to move or be active. If the spirometer includes an indicator to show the highest number that you have reached, your health care provider or respiratory therapist will help you set a goal. Keep a log of your progress as told by your health care provider. What are the risks? Breathing too quickly may cause dizziness or cause you to pass out. Take your time so you do not get dizzy or light-headed. If you are in pain, you may need to take pain medicine before doing incentive spirometry. It is harder  to take a deep breath if you are having pain. How to use your incentive spirometer  Sit up on the edge of your bed or on a chair. Hold the incentive spirometer so that it is in an upright position. Before you use the spirometer, breathe out normally. Place the mouthpiece in your mouth. Make sure your lips are closed tightly around it. Breathe in slowly and as deeply as you can through your mouth, causing the piston or the ball to rise toward the top of the chamber. Hold your breath for 3-5 seconds, or for as long as possible. If the spirometer includes a coach indicator, use this to guide you in breathing. Slow down your breathing if the indicator goes above the marked areas. Remove the mouthpiece from your mouth and breathe out normally. The piston or ball will return to the bottom of the chamber. Rest for a few seconds, then repeat the steps 10 or more times. Take your time and take a few normal breaths between deep breaths so that you do not get dizzy or light-headed. Do this every 1-2 hours when you are awake. If the spirometer includes a goal marker to show the highest number you have reached (best effort), use this as a goal to work toward during each repetition. After each set of 10 deep breaths, cough a few times. This will help to make sure that your lungs are clear. If you have  an incision on your chest or abdomen from surgery, place a pillow or a rolled-up towel firmly against the incision when you cough. This can help to reduce pain while taking deep breaths and coughing. General tips When you are able to get out of bed: Walk around often. Continue to take deep breaths and cough in order to clear your lungs. Keep using the incentive spirometer until your health care provider says it is okay to stop using it. If you have been in the hospital, you may be told to keep using the spirometer at home. Contact a health care provider if: You are having difficulty using the spirometer. You have trouble using the spirometer as often as instructed. Your pain medicine is not giving enough relief for you to use the spirometer as told. You have a fever. Get help right away if: You develop shortness of breath. You develop a cough with bloody mucus from the lungs. You have fluid or blood coming from an incision site after you cough. Summary An incentive spirometer is a tool that can help you learn to take long, deep breaths to keep your lungs clear and active. You may be asked to use a spirometer after a surgery, if you have a lung problem or a history of smoking, or if you have been inactive for a long period of time. Use your incentive spirometer as instructed every 1-2 hours while you are awake. If you have an incision on your chest or abdomen, place a pillow or a rolled-up towel firmly against your incision when you cough. This will help to reduce pain. Get help right away if you have shortness of breath, you cough up bloody mucus, or blood comes from your incision when you cough. This information is not intended to replace advice given to you by your health care provider. Make sure you discuss any questions you have with your health care provider. Document Revised: 08/16/2023 Document Reviewed: 08/16/2023 Elsevier Patient Education  2024 [employer]    Preoperative  Educational Videos for Total Hip, Knee and  Shoulder Replacements  To better prepare for surgery, please view our videos that explain the physical activity and discharge planning required to have the best surgical recovery at [facility].  [email or web address]  Questions? Call [phone] or email [email or web address]      Community Resource Directory to address health-related social needs:  [email or web address]

## 2024-06-08 ENCOUNTER — Other Ambulatory Visit: Payer: Self-pay

## 2024-06-08 ENCOUNTER — Inpatient Hospital Stay
Admission: RE | Admit: 2024-06-08 | Discharge: 2024-06-08 | Disposition: A | Discharge status: Home / Self Care | Source: Ambulatory Visit | Attending: Orthopedic Surgery

## 2024-06-08 VITALS — BP 107/61 | HR 56 | Ht 70.0 in | Wt 184.4 lb

## 2024-06-08 DIAGNOSIS — Z01818 Encounter for other preprocedural examination: Secondary | ICD-10-CM

## 2024-06-08 DIAGNOSIS — Z01812 Encounter for preprocedural laboratory examination: Secondary | ICD-10-CM | POA: Diagnosis not present

## 2024-06-08 HISTORY — DX: Gastro-esophageal reflux disease without esophagitis: K21.9

## 2024-06-08 HISTORY — DX: Polyneuropathy, unspecified: G62.9

## 2024-06-08 HISTORY — DX: Occlusion and stenosis of bilateral carotid arteries: I65.23

## 2024-06-08 HISTORY — DX: Presence of aortocoronary bypass graft: Z95.1

## 2024-06-08 HISTORY — DX: Other specified postprocedural states: Z98.890

## 2024-06-08 HISTORY — DX: Bilateral primary osteoarthritis of hip: M16.0

## 2024-06-08 HISTORY — DX: Personal history of nicotine dependence: Z87.891

## 2024-06-08 HISTORY — DX: Cardiac murmur, unspecified: R01.1

## 2024-06-08 HISTORY — DX: Unspecified systolic (congestive) heart failure: I50.20

## 2024-06-08 LAB — TYPE AND SCREEN
ABO/RH(D): O POS
Antibody Screen: NEGATIVE

## 2024-06-08 LAB — URINALYSIS, ROUTINE W REFLEX MICROSCOPIC
Bilirubin Urine: NEGATIVE
Glucose, UA: NEGATIVE mg/dL
Hgb urine dipstick: NEGATIVE
Ketones, ur: 5 mg/dL — AB
Leukocytes,Ua: NEGATIVE
Nitrite: NEGATIVE
Protein, ur: 30 mg/dL — AB
Specific Gravity, Urine: 1.031 — ABNORMAL HIGH (ref 1.005–1.030)
pH: 5 (ref 5.0–8.0)

## 2024-06-08 LAB — COMPREHENSIVE METABOLIC PANEL WITH GFR
ALT: 13 U/L (ref 0–44)
AST: 17 U/L (ref 15–41)
Albumin: 3.9 g/dL (ref 3.5–5.0)
Alkaline Phosphatase: 68 U/L (ref 38–126)
Anion gap: 9 (ref 5–15)
BUN: 15 mg/dL (ref 8–23)
CO2: 26 mmol/L (ref 22–32)
Calcium: 9.3 mg/dL (ref 8.9–10.3)
Chloride: 105 mmol/L (ref 98–111)
Creatinine, Ser: 0.92 mg/dL (ref 0.61–1.24)
GFR, Estimated: >60 mL/min (ref >60)
Glucose, Bld: 82 mg/dL (ref 70–99)
Potassium: 4 mmol/L (ref 3.5–5.1)
Sodium: 140 mmol/L (ref 135–145)
Total Bilirubin: 0.9 mg/dL (ref 0.0–1.2)
Total Protein: 7.1 g/dL (ref 6.5–8.1)

## 2024-06-08 LAB — CBC WITH DIFFERENTIAL/PLATELET
Abs Immature Granulocytes: 0.03 K/uL (ref 0.00–0.07)
Basophils Absolute: 0 K/uL (ref 0.0–0.1)
Basophils Relative: 0 %
Eosinophils Absolute: 0.1 K/uL (ref 0.0–0.5)
Eosinophils Relative: 1 %
HCT: 40.7 % (ref 39.0–52.0)
Hemoglobin: 14.1 g/dL (ref 13.0–17.0)
Immature Granulocytes: 0 %
Lymphocytes Relative: 12 %
Lymphs Abs: 1.1 K/uL (ref 0.7–4.0)
MCH: 31 pg (ref 26.0–34.0)
MCHC: 34.6 g/dL (ref 30.0–36.0)
MCV: 89.5 fL (ref 80.0–100.0)
Monocytes Absolute: 0.5 K/uL (ref 0.1–1.0)
Monocytes Relative: 6 %
Neutro Abs: 7.3 K/uL (ref 1.7–7.7)
Neutrophils Relative %: 81 %
Platelets: 222 K/uL (ref 150–400)
RBC: 4.55 MIL/uL (ref 4.22–5.81)
RDW: 12.8 % (ref 11.5–15.5)
WBC: 9 K/uL (ref 4.0–10.5)
nRBC: 0 % (ref 0.0–0.2)

## 2024-06-08 LAB — SURGICAL PCR SCREEN
MRSA, PCR: NEGATIVE
Staphylococcus aureus: NEGATIVE

## 2024-06-09 ENCOUNTER — Encounter: Payer: Self-pay | Admitting: Dermatology

## 2024-06-09 ENCOUNTER — Ambulatory Visit (INDEPENDENT_AMBULATORY_CARE_PROVIDER_SITE_OTHER)

## 2024-06-09 DIAGNOSIS — I429 Cardiomyopathy, unspecified: Secondary | ICD-10-CM | POA: Diagnosis not present

## 2024-06-10 LAB — CUP PACEART REMOTE DEVICE CHECK
Battery Remaining Longevity: 126 mo
Battery Remaining Percentage: 100 %
Brady Statistic RA Percent Paced: 0 %
Brady Statistic RV Percent Paced: 0 %
Date Time Interrogation Session: 20250819120200
HighPow Impedance: 43 Ohm
Implantable Lead Connection Status: 753985
Implantable Lead Connection Status: 753985
Implantable Lead Implant Date: 20101104
Implantable Lead Implant Date: 20101104
Implantable Lead Location: 753859
Implantable Lead Location: 753860
Implantable Lead Model: 158
Implantable Lead Model: 5076
Implantable Lead Serial Number: 301087
Implantable Pulse Generator Implant Date: 20210215
Lead Channel Impedance Value: 500 Ohm
Lead Channel Impedance Value: 514 Ohm
Lead Channel Setting Pacing Amplitude: 2 V
Lead Channel Setting Pacing Amplitude: 2.5 V
Lead Channel Setting Pacing Pulse Width: 0.4 ms
Lead Channel Setting Sensing Sensitivity: 0.5 mV
Pulse Gen Serial Number: 218699

## 2024-06-16 ENCOUNTER — Encounter: Payer: Self-pay | Admitting: Orthopedic Surgery

## 2024-06-16 ENCOUNTER — Encounter: Payer: Self-pay | Admitting: Cardiology

## 2024-06-16 DIAGNOSIS — D039 Melanoma in situ, unspecified: Secondary | ICD-10-CM

## 2024-06-16 NOTE — Progress Notes (Signed)
 Perioperative / Anesthesia Services  Pre-Admission Testing Clinical Review / Pre-Operative Anesthesia Consult  Date: 06/16/24  PATIENT DEMOGRAPHICS: Name: [name] [name] DOB: 03-May-1961 MRN:   [id number]  Note: Available PAT nursing documentation and vital signs have been reviewed. Clinical nursing staff has updated patient's PMH/PSHx, current medication list, and drug allergies/intolerances to ensure complete and comprehensive history available to assist care teams in MDM as it pertains to the aforementioned surgical procedure and anticipated anesthetic course. Extensive review of available clinical information personally performed. [place] [facility] and PSHx updated with any diagnoses/procedures that  may have been inadvertently omitted during his intake with the pre-admission testing department's nursing staff.  PLANNED SURGICAL PROCEDURE(S):   Case: [id number] Date/Time: 06/18/24 0715   Procedure: ARTHROPLASTY, HIP, TOTAL, ANTERIOR APPROACH (Right: Hip)   Anesthesia type: Choice   Diagnosis: Primary osteoarthritis of right hip [M16.11]   Pre-op diagnosis: Primary osteoarthritis of right hip M16.11   Location: [facility] OR ROOM 01 / [facility]   Surgeons: Lorelle Hussar, MD        CLINICAL DISCUSSION: LENIS NETTLETON is a 63 y.o. male who is submitted for pre-surgical anesthesia review and clearance prior to him undergoing the above procedure. Patient is a Former Smoker (37 pack years; quit 04/2009). Pertinent PMH includes: CAD (s/p CABG), postoperative atrial fibrillation, ischemic cardiomyopathy with resulting HFrEF (s/p AICD placement), mitral valve disease (s/p MVR), apical mural thrombus, BILATERAL carotid artery stenosis, cardiac murmur, HLD, GERD (no daily Tx), OA, neuropathy.   Patient is followed by cardiology Marsa, MD). He was last seen in the cardiology clinic on 04/22/2024; notes reviewed. At the time of his clinic visit, patient doing well overall from a  cardiovascular perspective. Patient denied any chest pain, shortness of breath, PND, orthopnea, palpitations, significant peripheral edema, weakness, fatigue, vertiginous symptoms, or presyncope/syncope. Patient with a past medical history significant for cardiovascular diagnoses. Documented physical exam was grossly benign, providing no evidence of acute exacerbation and/or decompensation of the patient's known cardiovascular conditions.  Patient with a longstanding history of an ischemic cardiomyopathy with resulting HFrEF.  EF as low as 20%.  Patient enrolled in the MADIT-RIT trial.  He underwent placement of a [employer] Teligen 100 AICD on 08/25/2009.  Due to device reaching ERI/EOS, pulse generator was changed on 12/17/2019, at which time a [employer] EL AICD (SN: [id number]) was placed.  Device regularly interrogated by patient's primary electrophysiology team.  Device most recently interrogated on 06/09/2024, at which time device was noted to be functioning properly.  Cardiology notes that patient is not pacemaker dependent.  Patient underwent diagnostic LEFT heart catheterization on 05/02/2009.  Study revealed multivessel CAD with complete occlusions of the proximal RCA, ostial LAD, and LCx.  Given the degree and complexity of patient's coronary artery disease, patient was referred to CVTS for consideration of revascularization.  Patient underwent two-vessel revascularization on 05/05/2009.  LIMA-LAD and RIMA-RCA bypass grafts were placed.  Additionally, patient with no significant mitral valve disease.  He underwent concurrent mitral valve replacement placing a 28-mm Sorin 3D MEMO annuloplasty ring.  Procedure was complicated by the development of postoperative atrial fibrillation.  Atrial arrhythmia was treated with antiarrhythmic therapy (amiodarone).  Additionally, patient developed and apical mural thrombus, which was treated with warfarin therapy.  Patient with a history of  known carotid artery disease.  Most recent carotid Doppler study performed on 08/07/2022 revealed a 1-39% stenosis of the patient's BILATERAL carotid arteries.  Vertebral arteries demonstrated antegrade flow.  There  were normal flow hemodynamics seen in the subclavian arteries.  Most recent TTE performed on 01/23/2023 revealed a moderately reduced left ventricular systolic function with an EF of 30-35%.  Left ventricle demonstrated global hypokinesis with akinesis in the apical segments.  Left ventricular diastolic Doppler parameters were indeterminate. Right ventricular size and function normal with a TAPSE measuring 1.9 cm  (normal range >/= 1.6 cm).  Ring annuloplasty ring noted to the mitral valve.  There was mild mitral and mild to moderate aortic valve regurgitation.  All transvalvular gradients were noted to be normal providing no evidence of hemodynamically significant valvular stenosis. Aorta normal in size with no evidence of ectasia or aneurysmal dilatation.  Ischemic cardiomyopathy and resulting HFrEF being managed on GDMT interventions including beta-blocker (carvedilol ), diuretic (spironolactone ), and ARB/ARNI (Entresto ), and SGLT2i (empagliflozin ) therapies.  Blood pressure documented on the softer side at 96/58 mmHg on the aforementioned regimen.  Patient is on atorvastatin  + ezetimibe  for his HLD diagnosis and ASCVD prevention.  Patient has a supply of short acting nitrates (NTG) to use on a as needed basis for recurrent angina/anginal equivalent symptoms; denied recent use.  Patient is not diabetic.  He does not have an OSAH diagnosis.  Functional capacity limited by orthopedic pain, however with that said, patient is able to complete all of his ADLs/IADLs without cardiovascular limitation.  Per the DASI, patient is able to exceed 4 METS of physical activity without experiencing any significant degrees of angina/anginal equivalent symptoms. No changes were made to his medication regimen during  his visit with cardiology.  Patient scheduled to follow-up with outpatient cardiology in 6 months or sooner if needed.  [name] is scheduled for an elective ARTHROPLASTY, HIP, TOTAL, ANTERIOR APPROACH (Right: Hip) on 06/18/2024 with Dr. Arthea Sheer, MD. Given patient's past medical history significant for cardiovascular diagnoses, presurgical cardiac clearance was sought by the PAT team.  Per cardiology, using the Duke Activity Status Index, he can achieve > 4 METs without cardiac limitation.  Per Revised Cardiac Risk Index, he is moderate risk for noncardiac procedure with an estimated rate of 6.6% for adverse cardiac event in the periprocedural timeframe.  The patient does have a persistent cardiomyopathy, though is stable and without symptoms of angina or cardiac decompensation.  No further cardiac testing will further reduce his periprocedural risk and he may proceed at an overall MODERATE risk.  In review of the patient's chart, it is noted that he is on daily oral antithrombotic therapy. Given that patient's past medical history is significant for cardiovascular diagnoses, including but not limited to CAD, orthopedics has cleared patient to continue his daily ASA throughout his perioperative course.  Of note, patient on 162 mg of ASA daily.  He was asked to reduce his dose down to 81 mg daily during his perioperative course.  His last 162 mg dose will be on 06/10/2024.  Patient has been updated on these directives from his specialty care providers by the PAT team.  Patient denies previous perioperative complications with anesthesia in the past. In review his EMR, it is noted that patient underwent a general anesthetic course here at [facility] ([facility]) in 07/2016 without documented complications.   MOST RECENT VITAL SIGNS:    06/08/2024    8:29 AM 06/03/2024    8:17 AM 05/07/2024    3:08 PM  Vitals with BMI  Height 5' 10    Weight 184 lbs 6 oz     BMI 26.46  Systolic 107 120 864  Diastolic 61 70 77  Pulse 56 73 78   PROVIDERS/SPECIALISTS: NOTE: Primary physician provider listed below. Patient may have been seen by APP or partner within same practice.   PROVIDER ROLE / SPECIALTY LAST SHERLEAN Lorelle Hussar, MD Orthopedics (Surgeon) 06/02/2024  Gareth Mliss FALCON, FNP Primary Care Provider 03/11/2024  Darron Grass, MD Cardiology 04/22/2024  Fernande Standing, MD Electrophysiology 07/30/2023   ALLERGIES: No Known Allergies  CURRENT HOME MEDICATIONS: No current facility-administered medications for this encounter.    aspirin  EC 81 MG tablet   atorvastatin  (LIPITOR ) 80 MG tablet   carvedilol  (COREG ) 25 MG tablet   empagliflozin  (JARDIANCE ) 10 MG TABS tablet   ezetimibe  (ZETIA ) 10 MG tablet   ibuprofen (ADVIL) 200 MG tablet   meloxicam  (MOBIC ) 7.5 MG tablet   nitroGLYCERIN  (NITROSTAT ) 0.4 MG SL tablet   Omega-3 Fatty Acids (FISH OIL PO)   sacubitril -valsartan  (ENTRESTO ) 24-26 MG   spironolactone  (ALDACTONE ) 25 MG tablet   HISTORY: Past Medical History:  Diagnosis Date   AICD (automatic cardioverter/defibrillator) present 08/25/2009   a.) enrolled in MADIT-RIT trial; s/p [employer] 100 AICD (SN: [id number]) placement 08/25/2009; b.) pulse generator changed 12/17/2019 --> [employer] EL AICD (SN: [id number]) placed   Apical mural thrombus    a.) Tx'd to resolution with warfarin   B12 deficiency    Bilateral carotid artery stenosis    Coronary artery disease    a.) LHC 05/02/2009: CTO pRCA, oLAD, LCx --> 2v revascularization 05/05/2009: LIMA-LAD, RIMA-RCA   GERD (gastroesophageal reflux disease)    Heart murmur    HFrEF (heart failure with reduced ejection fraction) (HCC)    History of mitral valve repair    History of tobacco use    Hyperlipidemia    Ischemic cardiomyopathy    a.) EF as low as 20%; s/p AICD placement 08/25/2009   Long term (current) use of aspirin     Malignant melanoma in situ  (HCC)    Mitral valve disease    a.) s/p MVR (28-mm Sorin 3D MEMO annuloplasty ring); performed concurrently with cardiac revascularization 05/05/2009   Neuropathy    Osteoarthritis, hip, bilateral    Postoperative atrial fibrillation (HCC)    a.) s/p cardiac revascularization 05/05/2009 --> Tx'd with amiodarone   S/P CABG x 2 05/05/2009   a.) LIMA-LAD, RIMA-RCA   S/P MVR (mitral valve repair) 05/05/2009   a.) 28-mm Sorin 3D MEMO annuloplasty ring   Tobacco use    Past Surgical History:  Procedure Laterality Date   CARDIAC DEFIBRILLATOR PLACEMENT N/A 08/25/2009   Procedure: CARDIAC DEFIBRILLATOR PLACEMENT   COLONOSCOPY WITH PROPOFOL  N/A 07/31/2016   Procedure: COLONOSCOPY WITH PROPOFOL ;  Surgeon: Gladis RAYMOND Mariner, MD;  Location: [facility] ENDOSCOPY;  Service: Endoscopy;  Laterality: N/A;   CORONARY ARTERY BYPASS GRAFT N/A 05/05/2009   Procedure: CORONARY ARTERY BYPASS GRAFT; Location: [facility]; Surgon: Dorise Fellers, MD   ICD GENERATOR CHANGEOUT N/A 12/07/2019   Procedure: ICD GENERATOR CHANGEOUT;  Surgeon: Fernande Standing BROCKS, MD;  Location: [facility] INVASIVE CV LAB;  Service: Cardiovascular;  Laterality: N/A;   KNEE SURGERY Bilateral    x4   LEFT HEART CATH AND CORONARY ANGIOGRAPHY Left 05/02/2009   Procedure: LEFT HEART CATH AND CORONARY ANGIOGRAPHY; Location: [place]   MITRAL VALVE REPAIR N/A 05/05/2009   Procedure: MITRAL VALVE REPAIR; Location: [facility]; Surgon: Dorise Fellers, MD   SHOULDER ARTHROSCOPY Bilateral    Family History  Problem Relation Age of Onset   Heart attack Father  74   Heart failure Mother    Heart attack Mother    Hyperlipidemia Other    Social History   Tobacco Use   Smoking status: Former    Current packs/day: 0.00    Average packs/day: 1 pack/day for 37.0 years (37.0 ttl pk-yrs)    Types: Cigarettes    Start date: 05/02/1972    Quit date: 05/02/2009    Years since quitting: 15.1   Smokeless tobacco: Never  Substance Use Topics   Alcohol use: No    LABS:  Lab Results  Component Value Date   WBC 9.0 06/08/2024   HGB 14.1 06/08/2024   HCT 40.7 06/08/2024   MCV 89.5 06/08/2024   PLT 222 06/08/2024   Lab Results  Component Value Date   NA 140 06/08/2024   CL 105 06/08/2024   K 4.0 06/08/2024   CO2 26 06/08/2024   BUN 15 06/08/2024   CREATININE 0.92 06/08/2024   GFRNONAA >60 06/08/2024   CALCIUM  9.3 06/08/2024   ALBUMIN 3.9 06/08/2024   GLUCOSE 82 06/08/2024    ECG: Date: 04/22/2024  Time ECG obtained: 0806 AM Rate: 62 bpm Rhythm: normal sinus Axis (leads I and aVF): normal Intervals: PR 158 ms. QRS 102 ms. QTc 420 ms. ST segment and T wave changes: No evidence of acute T wave abnormalities or significant ST segment elevation or depression.  Evidence of a possible, age undetermined, prior infarct:  Yes; inferior and anterior Comparison: Similar to previous tracing obtained on 07/30/2023   IMAGING / PROCEDURES: CT HIP RIGHT WO CONTRAST performed on 04/09/2024 Markedly severe osteoarthritis of the right hip with flattening and volume loss in the right femoral head, extensive subcortical sclerosis and degenerative subcortical cyst formation, and marked acetabular and femoral head spurring. Lateral positioning of the femoral head with respect to the acetabulum probably reflects pre-existing dysplasia. A scout image demonstrates similar lateral uncovering and severe osteoarthritis of the contralateral (left) hip although not quite as severe as the right. Suspected small right hip joint effusion. Right iliopsoas bursitis. Right iliac and common femoral artery atheromatous vascular calcification.   TRANSTHORACIC ECHOCARDIOGRAM performed on 01/23/2023 Left ventricular ejection fraction, by estimation, is 30 to 35%. The left ventricle has moderately decreased function. The left ventricle  demonstrates global hypokinesis. There is akinesis of the left ventricular, apical segment.  Right ventricular systolic function is  normal.  Mild mitral valve regurgitation.  Aortic valve regurgitation is mild to moderate.   VAS US  CAROTID performed on 08/07/2022 Velocities in the right ICA are consistent with a 1-39% stenosis. Non-hemodynamically significant plaque <50% noted in the CCA. The ECA appears <50% stenosed.  Velocities in the left ICA are consistent with a 1-39% stenosis. Non-hemodynamically significant plaque <50% noted in the CCA. The ECA appears <50% stenosed.  Bilateral vertebral arteries demonstrate antegrade flow.  Normal flow hemodynamics were seen in bilateral subclavian arteries.   IMPRESSION AND PLAN: CHOUA CHALKER has been referred for pre-anesthesia review and clearance prior to him undergoing the planned anesthetic and procedural courses. Available labs, pertinent testing, and imaging results were personally reviewed by me in preparation for upcoming operative/procedural course. [facility] medical record has been updated following extensive record review and patient interview with PAT staff.   This patient has been appropriately cleared by cardiology with an overall MODERATE risk of patient experiencing significant perioperative cardiovascular complications. Completed perioperative prescription for cardiac device management documentation completed by primary cardiology team and placed on patient's chart for review by the surgical/anesthetic  team on the day of his procedure. Electrophysiology indicating that procedure should not interfere with the planned surgical procedure. Beyond normal perioperative cardiovascular monitoring, there are no recommendations from his electrophysiology team that would prompt further discussions/recommendations from an industry representative.   Based on clinical review performed today (06/16/24), barring any significant acute changes in the patient's overall condition, it is anticipated that he will be able to proceed with the planned surgical intervention. Any acute  changes in clinical condition may necessitate his procedure being postponed and/or cancelled. Patient will meet with anesthesia team (MD and/or CRNA) on the day of his procedure for preoperative evaluation/assessment. Questions regarding anesthetic course will be fielded at that time.   Pre-surgical instructions were reviewed with the patient during his PAT appointment, and questions were fielded to satisfaction by PAT clinical staff. He has been instructed on which medications that he will need to hold prior to surgery, as well as the ones that have been deemed safe/appropriate to take on the day of his procedure. As part of the general education provided by PAT, patient made aware both verbally and in writing, that he would need to abstain from the use of any illegal substances during his perioperative course. He was advised that failure to follow the provided instructions could necessitate case cancellation or result in serious perioperative complications up to and including death. Patient encouraged to contact PAT and/or his surgeon's office to discuss any questions or concerns that may arise prior to surgery; verbalized understanding.   Dorise Pereyra, MSN, APRN, FNP-C, CEN [facility]  Perioperative Services Nurse Practitioner Phone: [phone] Fax: [phone] 06/16/24 1:48 PM  NOTE: This note has been prepared using Dragon dictation software. Despite my best ability to proofread, there is always the potential that unintentional transcriptional errors may still occur from this process.

## 2024-06-16 NOTE — Progress Notes (Signed)
 PERIOPERATIVE PRESCRIPTION FOR IMPLANTED CARDIAC DEVICE PROGRAMMING  Patient Information: Name:  [name] [name]  DOB:  1961-09-06  MRN:  [id number]    Planned Procedure: RIGHT TOTAL HIP ARTHROPLASTY  Surgeon:  Dr. Arthea Sheer, MD  Requesting device clearance: Dorise Pereyra, FNP-C  Date of Procedure:  06/18/2024  Cautery will be used.   Please route documentation back me via [facility], or may fax report to [facility] PAT APP at [phone].  Device Information:  Clinic EP Physician:  D. Fonda Kitty   Device Type:  Biochemist, clinical and Phone #:  [employer]: [phone] Pacemaker Dependent?:  No. Date of Last Device Check:  06/09/2024  Normal Device Function?:  Yes.    Electrophysiologist's Recommendations:  Have magnet available. Provide continuous ECG monitoring when magnet is used or reprogramming is to be performed.  Procedure should not interfere with device function.  No device programming or magnet placement needed.  Per Device Clinic Standing Orders, Almarie ONEIDA Shutter, RN  4:27 PM 06/16/2024

## 2024-06-18 ENCOUNTER — Ambulatory Visit: Payer: Self-pay | Admitting: Urgent Care

## 2024-06-18 ENCOUNTER — Encounter
Admission: RE | Disposition: A | Discharge status: Home Health | Payer: Self-pay | Source: Home / Self Care | Attending: Orthopedic Surgery

## 2024-06-18 ENCOUNTER — Other Ambulatory Visit: Payer: Self-pay

## 2024-06-18 ENCOUNTER — Encounter: Payer: Self-pay | Admitting: Orthopedic Surgery

## 2024-06-18 ENCOUNTER — Ambulatory Visit
Admission: RE | Admit: 2024-06-18 | Discharge: 2024-06-19 | Disposition: A | Discharge status: Home Health | Attending: Orthopedic Surgery | Admitting: Orthopedic Surgery

## 2024-06-18 ENCOUNTER — Ambulatory Visit

## 2024-06-18 DIAGNOSIS — Z952 Presence of prosthetic heart valve: Secondary | ICD-10-CM | POA: Diagnosis not present

## 2024-06-18 DIAGNOSIS — I5022 Chronic systolic (congestive) heart failure: Secondary | ICD-10-CM | POA: Insufficient documentation

## 2024-06-18 DIAGNOSIS — R011 Cardiac murmur, unspecified: Secondary | ICD-10-CM | POA: Insufficient documentation

## 2024-06-18 DIAGNOSIS — Z95810 Presence of automatic (implantable) cardiac defibrillator: Secondary | ICD-10-CM | POA: Diagnosis not present

## 2024-06-18 DIAGNOSIS — I2510 Atherosclerotic heart disease of native coronary artery without angina pectoris: Secondary | ICD-10-CM | POA: Diagnosis not present

## 2024-06-18 DIAGNOSIS — Z951 Presence of aortocoronary bypass graft: Secondary | ICD-10-CM | POA: Diagnosis not present

## 2024-06-18 DIAGNOSIS — Z96641 Presence of right artificial hip joint: Secondary | ICD-10-CM | POA: Diagnosis not present

## 2024-06-18 DIAGNOSIS — F17210 Nicotine dependence, cigarettes, uncomplicated: Secondary | ICD-10-CM | POA: Insufficient documentation

## 2024-06-18 DIAGNOSIS — E785 Hyperlipidemia, unspecified: Secondary | ICD-10-CM | POA: Insufficient documentation

## 2024-06-18 DIAGNOSIS — I080 Rheumatic disorders of both mitral and aortic valves: Secondary | ICD-10-CM | POA: Insufficient documentation

## 2024-06-18 DIAGNOSIS — D039 Melanoma in situ, unspecified: Secondary | ICD-10-CM

## 2024-06-18 DIAGNOSIS — M1611 Unilateral primary osteoarthritis, right hip: Secondary | ICD-10-CM | POA: Diagnosis not present

## 2024-06-18 DIAGNOSIS — I110 Hypertensive heart disease with heart failure: Secondary | ICD-10-CM | POA: Insufficient documentation

## 2024-06-18 DIAGNOSIS — I4891 Unspecified atrial fibrillation: Secondary | ICD-10-CM | POA: Diagnosis not present

## 2024-06-18 HISTORY — DX: Deficiency of other specified B group vitamins: E53.8

## 2024-06-18 HISTORY — DX: Melanoma in situ, unspecified: D03.9

## 2024-06-18 HISTORY — DX: Long term (current) use of aspirin: Z79.82

## 2024-06-18 HISTORY — DX: Unspecified atrial fibrillation: I48.91

## 2024-06-18 HISTORY — DX: Personal history of nicotine dependence: Z87.891

## 2024-06-18 HISTORY — DX: Intracardiac thrombosis, not elsewhere classified: I51.3

## 2024-06-18 HISTORY — PX: TOTAL HIP ARTHROPLASTY: SHX124

## 2024-06-18 LAB — TYPE AND SCREEN
ABO/RH(D): O POS
Antibody Screen: NEGATIVE

## 2024-06-18 SURGERY — ARTHROPLASTY, HIP, TOTAL, ANTERIOR APPROACH
Anesthesia: General | Site: Hip | Laterality: Right

## 2024-06-18 MED ORDER — CARVEDILOL 25 MG PO TABS
25.0000 mg | ORAL_TABLET | Freq: Two times a day (BID) | ORAL | Status: DC
  Administered 2024-06-19: 25 mg via ORAL
  Filled 2024-06-18: qty 1

## 2024-06-18 MED ORDER — ORAL CARE MOUTH RINSE: 15.0000 mL | OROMUCOSAL | Status: DC | PRN

## 2024-06-18 MED ORDER — TRAMADOL HCL 50 MG PO TABS
50.0000 mg | ORAL_TABLET | Freq: Four times a day (QID) | ORAL | Status: DC | PRN
  Administered 2024-06-18: 50 mg via ORAL

## 2024-06-18 MED ORDER — ENOXAPARIN SODIUM 40 MG/0.4ML IJ SOSY: 40.0000 mg | PREFILLED_SYRINGE | INTRAMUSCULAR | 0 refills | Status: DC

## 2024-06-18 MED ORDER — EPHEDRINE 5 MG/ML INJ
INTRAVENOUS | Status: AC
Start: 2024-06-18 — End: 2024-06-18
  Filled 2024-06-18: qty 5

## 2024-06-18 MED ORDER — PHENYLEPHRINE HCL-NACL 20-0.9 MG/250ML-% IV SOLN
INTRAVENOUS | Status: AC
Start: 2024-06-18 — End: 2024-06-18
  Filled 2024-06-18: qty 250

## 2024-06-18 MED ORDER — ONDANSETRON HCL 4 MG/2ML IJ SOLN
INTRAMUSCULAR | Status: DC | PRN
  Administered 2024-06-18: 4 mg via INTRAVENOUS

## 2024-06-18 MED ORDER — KETOROLAC TROMETHAMINE 15 MG/ML IJ SOLN
7.5000 mg | Freq: Four times a day (QID) | INTRAMUSCULAR | Status: AC
  Administered 2024-06-18 – 2024-06-19 (×4): 7.5 mg via INTRAVENOUS
  Filled 2024-06-18 (×3): qty 1

## 2024-06-18 MED ORDER — ONDANSETRON HCL 4 MG/2ML IJ SOLN: 4.0000 mg | Freq: Four times a day (QID) | INTRAMUSCULAR | Status: DC | PRN

## 2024-06-18 MED ORDER — SODIUM CHLORIDE (PF) 0.9 % IJ SOLN
INTRAMUSCULAR | Status: DC | PRN
  Administered 2024-06-18: 50 mL via INTRAMUSCULAR

## 2024-06-18 MED ORDER — FENTANYL CITRATE (PF) 100 MCG/2ML IJ SOLN
INTRAMUSCULAR | Status: AC
Start: 2024-06-18 — End: 2024-06-18
  Filled 2024-06-18: qty 2

## 2024-06-18 MED ORDER — CHLORHEXIDINE GLUCONATE 0.12 % MT SOLN
OROMUCOSAL | Status: AC
  Filled 2024-06-18: qty 15

## 2024-06-18 MED ORDER — PANTOPRAZOLE SODIUM 40 MG PO TBEC
DELAYED_RELEASE_TABLET | ORAL | Status: AC
Start: 2024-06-18 — End: 2024-06-18
  Filled 2024-06-18: qty 1

## 2024-06-18 MED ORDER — SODIUM CHLORIDE (PF) 0.9 % IJ SOLN
INTRAMUSCULAR | Status: AC
  Filled 2024-06-18: qty 20

## 2024-06-18 MED ORDER — EZETIMIBE 10 MG PO TABS
10.0000 mg | ORAL_TABLET | ORAL | Status: DC
  Administered 2024-06-19: 10 mg via ORAL

## 2024-06-18 MED ORDER — SODIUM CHLORIDE (PF) 0.9 % IJ SOLN
INTRAMUSCULAR | Status: AC
  Filled 2024-06-18: qty 10

## 2024-06-18 MED ORDER — DEXAMETHASONE SODIUM PHOSPHATE 10 MG/ML IJ SOLN
INTRAMUSCULAR | Status: AC
  Filled 2024-06-18: qty 1

## 2024-06-18 MED ORDER — MIDAZOLAM HCL 2 MG/2ML IJ SOLN
INTRAMUSCULAR | Status: AC
  Filled 2024-06-18: qty 2

## 2024-06-18 MED ORDER — ACETAMINOPHEN 10 MG/ML IV SOLN
INTRAVENOUS | Status: DC | PRN
Start: 2024-06-18 — End: 2024-06-18
  Administered 2024-06-18: 1000 mg via INTRAVENOUS

## 2024-06-18 MED ORDER — EPHEDRINE SULFATE-NACL 50-0.9 MG/10ML-% IV SOSY
PREFILLED_SYRINGE | INTRAVENOUS | Status: DC | PRN
  Administered 2024-06-18: 5 mg via INTRAVENOUS

## 2024-06-18 MED ORDER — FENTANYL CITRATE (PF) 100 MCG/2ML IJ SOLN
INTRAMUSCULAR | Status: AC
  Filled 2024-06-18: qty 2

## 2024-06-18 MED ORDER — SPIRONOLACTONE 25 MG PO TABS
25.0000 mg | ORAL_TABLET | ORAL | Status: DC
  Administered 2024-06-19: 25 mg via ORAL
  Filled 2024-06-18: qty 1

## 2024-06-18 MED ORDER — TRANEXAMIC ACID-NACL 1000-0.7 MG/100ML-% IV SOLN
INTRAVENOUS | Status: AC
  Filled 2024-06-18: qty 100

## 2024-06-18 MED ORDER — OXYCODONE HCL 5 MG PO TABS: 2.5000 mg | ORAL_TABLET | Freq: Three times a day (TID) | ORAL | 0 refills | Status: DC | PRN

## 2024-06-18 MED ORDER — SUGAMMADEX SODIUM 200 MG/2ML IV SOLN
INTRAVENOUS | Status: DC | PRN
  Administered 2024-06-18: 150 mg via INTRAVENOUS
  Administered 2024-06-18: 50 mg via INTRAVENOUS

## 2024-06-18 MED ORDER — CEFAZOLIN SODIUM-DEXTROSE 2-4 GM/100ML-% IV SOLN
2.0000 g | INTRAVENOUS | Status: AC
  Administered 2024-06-18: 2 g via INTRAVENOUS

## 2024-06-18 MED ORDER — PROPOFOL 10 MG/ML IV BOLUS
INTRAVENOUS | Status: DC | PRN
  Administered 2024-06-18: 100 mg via INTRAVENOUS

## 2024-06-18 MED ORDER — DOCUSATE SODIUM 100 MG PO CAPS: 100.0000 mg | ORAL_CAPSULE | Freq: Two times a day (BID) | ORAL | 0 refills | Status: DC

## 2024-06-18 MED ORDER — MIDAZOLAM HCL 2 MG/2ML IJ SOLN
INTRAMUSCULAR | Status: DC | PRN
Start: 2024-06-18 — End: 2024-06-18
  Administered 2024-06-18: 2 mg via INTRAVENOUS

## 2024-06-18 MED ORDER — ACETAMINOPHEN 500 MG PO TABS
1000.0000 mg | ORAL_TABLET | Freq: Three times a day (TID) | ORAL | Status: DC
  Administered 2024-06-19 (×2): 1000 mg via ORAL
  Filled 2024-06-18 (×2): qty 2

## 2024-06-18 MED ORDER — MENTHOL 3 MG MT LOZG: 1.0000 | LOZENGE | OROMUCOSAL | Status: DC | PRN

## 2024-06-18 MED ORDER — OXYCODONE HCL 5 MG PO TABS
5.0000 mg | ORAL_TABLET | Freq: Once | ORAL | Status: AC | PRN
  Administered 2024-06-18: 5 mg via ORAL

## 2024-06-18 MED ORDER — FENTANYL CITRATE (PF) 100 MCG/2ML IJ SOLN
25.0000 ug | INTRAMUSCULAR | Status: DC | PRN
  Administered 2024-06-18: 50 ug via INTRAVENOUS
  Administered 2024-06-18 (×2): 25 ug via INTRAVENOUS
  Administered 2024-06-18: 50 ug via INTRAVENOUS

## 2024-06-18 MED ORDER — ENOXAPARIN SODIUM 40 MG/0.4ML IJ SOSY
40.0000 mg | PREFILLED_SYRINGE | INTRAMUSCULAR | Status: DC
  Administered 2024-06-19: 40 mg via SUBCUTANEOUS
  Filled 2024-06-18: qty 0.4

## 2024-06-18 MED ORDER — TRAMADOL HCL 50 MG PO TABS: 50.0000 mg | ORAL_TABLET | Freq: Four times a day (QID) | ORAL | 0 refills | Status: DC | PRN

## 2024-06-18 MED ORDER — OXYCODONE HCL 5 MG PO TABS
ORAL_TABLET | ORAL | Status: AC
  Filled 2024-06-18: qty 1

## 2024-06-18 MED ORDER — BUPIVACAINE-EPINEPHRINE (PF) 0.25% -1:200000 IJ SOLN
INTRAMUSCULAR | Status: AC
  Filled 2024-06-18: qty 60

## 2024-06-18 MED ORDER — MORPHINE SULFATE (PF) 2 MG/ML IV SOLN: 0.5000 mg | INTRAVENOUS | Status: DC | PRN

## 2024-06-18 MED ORDER — ATORVASTATIN CALCIUM 80 MG PO TABS
80.0000 mg | ORAL_TABLET | ORAL | Status: DC
  Administered 2024-06-19: 80 mg via ORAL
  Filled 2024-06-18: qty 1

## 2024-06-18 MED ORDER — MENTHOL 3 MG MT LOZG: 1.0000 | LOZENGE | OROMUCOSAL | 12 refills | Status: DC | PRN

## 2024-06-18 MED ORDER — CEFAZOLIN SODIUM-DEXTROSE 2-4 GM/100ML-% IV SOLN
INTRAVENOUS | Status: AC
Start: 2024-06-18 — End: 2024-06-18
  Filled 2024-06-18: qty 100

## 2024-06-18 MED ORDER — PROPOFOL 1000 MG/100ML IV EMUL
INTRAVENOUS | Status: AC
  Filled 2024-06-18: qty 100

## 2024-06-18 MED ORDER — PROPOFOL 10 MG/ML IV BOLUS
INTRAVENOUS | Status: AC
  Filled 2024-06-18: qty 20

## 2024-06-18 MED ORDER — SODIUM CHLORIDE 0.9 % IR SOLN
Status: DC | PRN
  Administered 2024-06-18: 100 mL

## 2024-06-18 MED ORDER — SURGIFLO WITH THROMBIN (HEMOSTATIC MATRIX KIT) OPTIME
TOPICAL | Status: DC | PRN
  Administered 2024-06-18: 1 via TOPICAL

## 2024-06-18 MED ORDER — DOCUSATE SODIUM 100 MG PO CAPS
100.0000 mg | ORAL_CAPSULE | Freq: Two times a day (BID) | ORAL | Status: DC
  Administered 2024-06-19: 100 mg via ORAL
  Filled 2024-06-18 (×2): qty 1

## 2024-06-18 MED ORDER — PHENYLEPHRINE HCL-NACL 20-0.9 MG/250ML-% IV SOLN
INTRAVENOUS | Status: DC | PRN
  Administered 2024-06-18: 25 ug/min via INTRAVENOUS

## 2024-06-18 MED ORDER — ACETAMINOPHEN 10 MG/ML IV SOLN
INTRAVENOUS | Status: AC
  Filled 2024-06-18: qty 100

## 2024-06-18 MED ORDER — 0.9 % SODIUM CHLORIDE (POUR BTL) OPTIME
TOPICAL | Status: DC | PRN
  Administered 2024-06-18: 500 mL

## 2024-06-18 MED ORDER — ONDANSETRON HCL 4 MG PO TABS: 4.0000 mg | ORAL_TABLET | Freq: Four times a day (QID) | ORAL | Status: DC | PRN

## 2024-06-18 MED ORDER — METOCLOPRAMIDE HCL 10 MG PO TABS: 5.0000 mg | ORAL_TABLET | Freq: Three times a day (TID) | ORAL | Status: DC | PRN

## 2024-06-18 MED ORDER — PANTOPRAZOLE SODIUM 40 MG PO TBEC
40.0000 mg | DELAYED_RELEASE_TABLET | Freq: Every day | ORAL | Status: DC
  Administered 2024-06-18 – 2024-06-19 (×2): 40 mg via ORAL
  Filled 2024-06-18: qty 1

## 2024-06-18 MED ORDER — ONDANSETRON HCL 4 MG/2ML IJ SOLN
INTRAMUSCULAR | Status: AC
  Filled 2024-06-18: qty 2

## 2024-06-18 MED ORDER — SACUBITRIL-VALSARTAN 24-26 MG PO TABS
1.0000 | ORAL_TABLET | Freq: Two times a day (BID) | ORAL | Status: DC
  Administered 2024-06-18 – 2024-06-19 (×2): 1 via ORAL
  Filled 2024-06-18 (×2): qty 1

## 2024-06-18 MED ORDER — ONDANSETRON HCL 4 MG PO TABS: 4.0000 mg | ORAL_TABLET | Freq: Four times a day (QID) | ORAL | 0 refills | Status: DC | PRN

## 2024-06-18 MED ORDER — OXYCODONE HCL 5 MG/5ML PO SOLN: 5.0000 mg | Freq: Once | ORAL | Status: AC | PRN

## 2024-06-18 MED ORDER — HYDROCODONE-ACETAMINOPHEN 5-325 MG PO TABS: 1.0000 | ORAL_TABLET | ORAL | Status: DC | PRN

## 2024-06-18 MED ORDER — ORAL CARE MOUTH RINSE: 15.0000 mL | Freq: Once | OROMUCOSAL | Status: AC

## 2024-06-18 MED ORDER — CELECOXIB 100 MG PO CAPS
100.0000 mg | ORAL_CAPSULE | Freq: Two times a day (BID) | ORAL | 0 refills | Status: AC
Start: 2024-06-18 — End: 2024-07-02

## 2024-06-18 MED ORDER — LIDOCAINE HCL (PF) 2 % IJ SOLN
INTRAMUSCULAR | Status: AC
  Filled 2024-06-18: qty 5

## 2024-06-18 MED ORDER — KETOROLAC TROMETHAMINE 15 MG/ML IJ SOLN
INTRAMUSCULAR | Status: AC
  Filled 2024-06-18: qty 1

## 2024-06-18 MED ORDER — FENTANYL CITRATE (PF) 100 MCG/2ML IJ SOLN
INTRAMUSCULAR | Status: DC | PRN
  Administered 2024-06-18: 50 ug via INTRAVENOUS
  Administered 2024-06-18: 25 ug via INTRAVENOUS
  Administered 2024-06-18: 50 ug via INTRAVENOUS

## 2024-06-18 MED ORDER — ROCURONIUM BROMIDE 100 MG/10ML IV SOLN
INTRAVENOUS | Status: DC | PRN
Start: 2024-06-18 — End: 2024-06-18
  Administered 2024-06-18: 50 mg via INTRAVENOUS
  Administered 2024-06-18 (×2): 10 mg via INTRAVENOUS
  Administered 2024-06-18: 20 mg via INTRAVENOUS

## 2024-06-18 MED ORDER — TRAMADOL HCL 50 MG PO TABS
ORAL_TABLET | ORAL | Status: AC
Start: 2024-06-18 — End: 2024-06-18
  Filled 2024-06-18: qty 1

## 2024-06-18 MED ORDER — ROCURONIUM BROMIDE 10 MG/ML (PF) SYRINGE
PREFILLED_SYRINGE | INTRAVENOUS | Status: AC
  Filled 2024-06-18: qty 10

## 2024-06-18 MED ORDER — ACETAMINOPHEN 500 MG PO TABS: 1000.0000 mg | ORAL_TABLET | Freq: Three times a day (TID) | ORAL | 0 refills | Status: DC

## 2024-06-18 MED ORDER — ASPIRIN 81 MG PO TBEC
81.0000 mg | DELAYED_RELEASE_TABLET | Freq: Every day | ORAL | Status: DC
  Administered 2024-06-19: 81 mg via ORAL
  Filled 2024-06-18: qty 1

## 2024-06-18 MED ORDER — PHENOL 1.4 % MT LIQD
1.0000 | OROMUCOSAL | Status: DC | PRN
Start: 2024-06-18 — End: 2024-06-19

## 2024-06-18 MED ORDER — SODIUM CHLORIDE 0.9 % IV SOLN: INTRAVENOUS | Status: DC

## 2024-06-18 MED ORDER — BUPIVACAINE LIPOSOME 1.3 % IJ SUSP
INTRAMUSCULAR | Status: AC
  Filled 2024-06-18: qty 40

## 2024-06-18 MED ORDER — DEXAMETHASONE SODIUM PHOSPHATE 10 MG/ML IJ SOLN
8.0000 mg | Freq: Once | INTRAMUSCULAR | Status: AC
  Administered 2024-06-18: 10 mg via INTRAVENOUS

## 2024-06-18 MED ORDER — CHLORHEXIDINE GLUCONATE 0.12 % MT SOLN
15.0000 mL | Freq: Once | OROMUCOSAL | Status: AC
  Administered 2024-06-18: 15 mL via OROMUCOSAL

## 2024-06-18 MED ORDER — PHENYLEPHRINE 80 MCG/ML (10ML) SYRINGE FOR IV PUSH (FOR BLOOD PRESSURE SUPPORT)
PREFILLED_SYRINGE | INTRAVENOUS | Status: DC | PRN
  Administered 2024-06-18: 120 ug via INTRAVENOUS

## 2024-06-18 MED ORDER — METOCLOPRAMIDE HCL 5 MG/ML IJ SOLN: 5.0000 mg | Freq: Three times a day (TID) | INTRAMUSCULAR | Status: DC | PRN

## 2024-06-18 MED ORDER — ACETAMINOPHEN 325 MG PO TABS: 325.0000 mg | ORAL_TABLET | Freq: Four times a day (QID) | ORAL | Status: DC | PRN

## 2024-06-18 MED ORDER — SURGIPHOR WOUND IRRIGATION SYSTEM - OPTIME
TOPICAL | Status: DC | PRN
Start: 2024-06-18 — End: 2024-06-18

## 2024-06-18 MED ORDER — TRANEXAMIC ACID-NACL 1000-0.7 MG/100ML-% IV SOLN
1000.0000 mg | INTRAVENOUS | Status: AC
  Administered 2024-06-18 (×2): 1000 mg via INTRAVENOUS

## 2024-06-18 MED ORDER — LACTATED RINGERS IV SOLN: INTRAVENOUS | Status: DC

## 2024-06-18 MED ORDER — LIDOCAINE HCL (CARDIAC) PF 100 MG/5ML IV SOSY
PREFILLED_SYRINGE | INTRAVENOUS | Status: DC | PRN
  Administered 2024-06-18: 80 mg via INTRAVENOUS

## 2024-06-18 MED ORDER — CEFAZOLIN SODIUM-DEXTROSE 2-4 GM/100ML-% IV SOLN
2.0000 g | Freq: Four times a day (QID) | INTRAVENOUS | Status: AC
  Administered 2024-06-18 (×2): 2 g via INTRAVENOUS
  Filled 2024-06-18: qty 100

## 2024-06-18 SURGICAL SUPPLY — 57 items
BLADE CLIPPER SURG (BLADE) IMPLANT
BLADE SAGITTAL AGGR TOOTH XLG (BLADE) ×1 IMPLANT
BNDG COHESIVE 6X5 TAN ST LF (GAUZE/BANDAGES/DRESSINGS) ×2 IMPLANT
BRUSH SCRUB EZ PLAIN DRY (MISCELLANEOUS) ×1 IMPLANT
CHLORAPREP W/TINT 26 (MISCELLANEOUS) ×1 IMPLANT
DERMABOND ADVANCED .7 DNX12 (GAUZE/BANDAGES/DRESSINGS) ×1 IMPLANT
DRAPE C-ARM XRAY 36X54 (DRAPES) ×1 IMPLANT
DRAPE SHEET LG 3/4 BI-LAMINATE (DRAPES) ×2 IMPLANT
DRAPE TABLE BACK 80X90 (DRAPES) ×1 IMPLANT
DRSG MEPILEX SACRM 8.7X9.8 (GAUZE/BANDAGES/DRESSINGS) ×1 IMPLANT
DRSG OPSITE POSTOP 4X8 (GAUZE/BANDAGES/DRESSINGS) ×1 IMPLANT
ELECTRODE BLDE 4.0 EZ CLN MEGD (MISCELLANEOUS) ×1 IMPLANT
ELECTRODE REM PT RTRN 9FT ADLT (ELECTROSURGICAL) ×1 IMPLANT
GLOVE BIO SURGEON STRL SZ8 (GLOVE) ×1 IMPLANT
GLOVE BIOGEL PI IND STRL 8 (GLOVE) ×1 IMPLANT
GLOVE PI ORTHO PRO STRL 7.5 (GLOVE) ×2 IMPLANT
GLOVE PI ORTHO PRO STRL SZ8 (GLOVE) ×2 IMPLANT
GLOVE SURG SYN 7.5 PF PI (GLOVE) ×1 IMPLANT
GOWN SRG XL LVL 3 NONREINFORCE (GOWNS) ×1 IMPLANT
GOWN STRL REUS W/ TWL LRG LVL3 (GOWN DISPOSABLE) ×1 IMPLANT
GOWN STRL REUS W/ TWL XL LVL3 (GOWN DISPOSABLE) ×1 IMPLANT
HEAD CERAMIC FEMORAL 36MM (Head) IMPLANT
HOOD PEEL AWAY T7 (MISCELLANEOUS) ×2 IMPLANT
INSERT TRIDENT POLY 36 0DEG (Insert) IMPLANT
IV NS 100ML SINGLE PACK (IV SOLUTION) ×1 IMPLANT
KIT PATIENT CARE HANA TABLE (KITS) ×1 IMPLANT
KIT TURNOVER CYSTO (KITS) ×1 IMPLANT
LIGHT WAVEGUIDE WIDE FLAT (MISCELLANEOUS) ×1 IMPLANT
MANIFOLD NEPTUNE II (INSTRUMENTS) ×1 IMPLANT
MARKER SKIN DUAL TIP RULER LAB (MISCELLANEOUS) ×1 IMPLANT
MAT ABSORB FLUID 56X50 GRAY (MISCELLANEOUS) ×1 IMPLANT
NDL SPNL 20GX3.5 QUINCKE YW (NEEDLE) ×1 IMPLANT
NEEDLE SPNL 20GX3.5 QUINCKE YW (NEEDLE) ×1 IMPLANT
NS IRRIG 500ML POUR BTL (IV SOLUTION) ×1 IMPLANT
PACK HIP COMPR (MISCELLANEOUS) ×1 IMPLANT
PAD ARMBOARD POSITIONER FOAM (MISCELLANEOUS) ×1 IMPLANT
PENCIL SMOKE EVACUATOR (MISCELLANEOUS) ×1 IMPLANT
SCREW HEX LP 6.5X20 (Screw) IMPLANT
SCREW HEX LP 6.5X30 (Screw) IMPLANT
SHELL CLUSTERHOLE ACETABULAR 5 (Shell) IMPLANT
SLEEVE SCD COMPRESS KNEE MED (STOCKING) ×1 IMPLANT
SOLUTION IRRIG SURGIPHOR (IV SOLUTION) ×1 IMPLANT
STEM HIGH OFFSET SZ5X105 HIGH (Stem) IMPLANT
SURGIFLO W/THROMBIN 8M KIT (HEMOSTASIS) IMPLANT
SUT BONE WAX W31G (SUTURE) ×1 IMPLANT
SUT ETHIBOND 2 V 37 (SUTURE) ×1 IMPLANT
SUT SILK 0 30XBRD TIE 6 (SUTURE) ×1 IMPLANT
SUT STRATAFIX 14 PDO 36 VLT (SUTURE) ×1 IMPLANT
SUT VIC AB 0 CT1 36 (SUTURE) ×1 IMPLANT
SUT VIC AB 2-0 CT2 27 (SUTURE) ×1 IMPLANT
SUTURE STRATA SPIR 4-0 18 (SUTURE) ×1 IMPLANT
SYR 20ML LL LF (SYRINGE) ×2 IMPLANT
TAPE MICROFOAM 4IN (TAPE) IMPLANT
TOWEL OR 17X26 4PK STRL BLUE (TOWEL DISPOSABLE) IMPLANT
TRAP FLUID SMOKE EVACUATOR (MISCELLANEOUS) ×1 IMPLANT
WAND WEREWOLF FASTSEAL 6.0 (MISCELLANEOUS) ×1 IMPLANT
WATER STERILE IRR 1000ML POUR (IV SOLUTION) ×1 IMPLANT

## 2024-06-18 NOTE — H&P (Signed)
 History of Present Illness: [name] [name] is an 63 y.o. male who presents for history and physical for right anterior total hip arthroplasty with Dr. Lorelle on 06/18/2024. Patient has severe right hip degenerative changes with complete loss of joint space in the right hip joint with deformity of the femoral head. He has severe pain limiting his mobility and activities of daily living. His gait has been drastically affected by his hip arthritis. He is unable to stand for extended periods of time. He is taken some meloxicam  with little relief. Denies any back pain numbness tingling radicular symptoms.  Patient has been an occasional smoker, 1 cigarette daily as needed. Is understanding of risk of infection and willing to quit smoking as of today and for at least 6 weeks following surgery. Nondiabetic with an A1c of 5.5 and a BMI of 26  Past Medical History: Past Medical History:  Diagnosis Date  Heart disease  Tubular adenoma of colon 07/31/2016   Past Surgical History: Past Surgical History:  Procedure Laterality Date  COLONOSCOPY 07/31/2016  Tubular adenoma of colon/Internal hemorrhoids/Repeat 58yrs/MUS  knee surgery Bilateral  REPLACEMENT MITRAL VALVE VIA HEART PORT Left   Past Family History: Family History  Problem Relation Age of Onset  Colon cancer Neg Hx  Colon polyps Neg Hx  Rectal cancer Neg Hx  Liver disease Neg Hx   Medications: Current Outpatient Medications  Medication Sig Dispense Refill  aspirin  81 MG EC tablet Take 81 mg by mouth once daily.  atorvastatin  (LIPITOR ) 80 MG tablet  carvedilol  (COREG ) 25 MG tablet  diclofenac  (VOLTAREN ) 1 % topical gel Apply 2 g topically 4 (four) times daily  empagliflozin  (JARDIANCE ) 10 mg tablet Take 1 tablet by mouth once daily  enalapril  (VASOTEC ) 10 MG tablet (Patient not taking: Reported on 06/02/2024)  ezetimibe  (ZETIA ) 10 mg tablet Take 1 tablet by mouth once daily  meloxicam  (MOBIC ) 7.5 MG tablet Take 7.5 mg by mouth once  daily  nitroGLYcerin  (NITROSTAT ) 0.4 MG SL tablet Place 0.4 mg under the tongue every 5 (five) minutes as needed  omega-3 fatty acids-fish oil 360-1,200 mg Cap Take 2,400 mg by mouth once daily  omega-3 fatty acids/fish oil 340-1,000 mg capsule Take 1 capsule by mouth 2 (two) times daily. (Patient not taking: Reported on 06/02/2024)  polyethylene glycol (MIRALAX) powder As directed for colonic prep. (Patient not taking: Reported on 06/02/2024) 255 g 0  sacubitriL -valsartan  (ENTRESTO ) 24-26 mg tablet Take 1 tablet by mouth 2 (two) times daily  spironolactone  (ALDACTONE ) 25 MG tablet   No current facility-administered medications for this visit.   Allergies: No Known Allergies   Visit Vitals: Vitals:  06/02/24 0819  BP: 118/72    Review of Systems:  A comprehensive 14 point ROS was performed, reviewed, and the pertinent orthopaedic findings are documented in the HPI.  Physical Exam: Body mass index is 25.91 kg/m. BP 118/72  Ht 177.8 cm (5' 10)  Wt 81.9 kg (180 lb 9.6 oz)  BMI 25.91 kg/m  General:  Well developed, well nourished, no apparent distress, normal affect, antalgic gait with no assistive device  HEENT: Head normocephalic, atraumatic, PERRL.   Abdomen: Soft, non tender, non distended, Bowel sounds present.  Heart: Examination of the heart reveals regular, rate, and rhythm. There is no murmur noted on ascultation. There is a normal apical pulse.  Lungs: Lungs are clear to auscultation. There is no wheeze, rhonchi, or crackles. There is normal expansion of bilateral chest walls.   Right hip exam  SKIN:  intact SWELLING: none WARMTH: no warmth TENDERNESS: none, Stinchfield Positive on both sides ROM: 0 degrees of internal rotation 10 degrees of external rotation on the right hip with 90 degrees of flexion and pain with internal rotation and external rotation. Left hip has 0 degrees of internal rotation and 20 degrees of external rotation hip flexion to 95 degrees  pain with internal and external rotation localized to the groin.  STRENGTH: limited by pain GAIT: Trendelenburg/wobbling gait on both sides difficult to tell whether it is a true Trendelenburg or just due to severe stiffness in the hip STABILITY: stable to testing CREPITUS: yes LEG LENGTH DISCREPANCY: left longer by .7 cm NEUROLOGICAL EXAM: normal VASCULAR EXAM: normal LUMBAR SPINE: tenderness: no straight leg raising sign: no motor exam: normal  Hip Imaging :  I reviewed AP pelvis and lateral right hip x-rays performed on 03/30/2024 images reviewed myself. There is severe degenerative changes to both hips with lateralization of the hip centers flattening of the femoral heads with deformity and collapse. Severe bilateral hip arthritis with dysplastic changes.  I reviewed AP right hip x-ray performed in clinic today images reviewed myself. She has redemonstration of the severe bilateral hip arthritic changes noted on prior films. No fractures or dislocations noted.  Narrative & Impression  CLINICAL DATA: Osteoarthritis of the right hip  EXAM: CT OF THE RIGHT HIP WITHOUT CONTRAST  TECHNIQUE: Multidetector CT imaging of the right hip was performed according to the standard protocol. Multiplanar CT image reconstructions were also generated.  RADIATION DOSE REDUCTION: This exam was performed according to the departmental dose-optimization program which includes automated exposure control, adjustment of the mA and/or kV according to patient size and/or use of iterative reconstruction technique.  COMPARISON: None Available.  FINDINGS: Bones/Joint/Cartilage  Markedly severe osteoarthritis of the right hip noted with elimination of the articular cartilage space, flattening and volume loss in the right femoral head, extensive subcortical sclerosis and degenerative subcortical cyst formation, and marked acetabular and femoral head spurring. Lateral positioning of the femoral head  with respect to the acetabulum probably reflects pre-existing dysplasia.  A scout image demonstrates similar lateral uncovering and severe osteoarthritis of the contralateral (left) hip although not quite as severe as the right.  Suspected small right hip joint effusion. There is also right iliopsoas bursitis  Ligaments  Suboptimally assessed by CT.  Muscles and Tendons  Unremarkable  Soft tissues  Right iliac and common femoral artery atheromatous vascular calcification.  IMPRESSION: 1. Markedly severe osteoarthritis of the right hip with flattening and volume loss in the right femoral head, extensive subcortical sclerosis and degenerative subcortical cyst formation, and marked acetabular and femoral head spurring. 2. Lateral positioning of the femoral head with respect to the acetabulum probably reflects pre-existing dysplasia. 3. A scout image demonstrates similar lateral uncovering and severe osteoarthritis of the contralateral (left) hip although not quite as severe as the right. 4. Suspected small right hip joint effusion. 5. Right iliopsoas bursitis. 6. Right iliac and common femoral artery atheromatous vascular calcification.   Electronically Signed By: Ryan Salvage M.D. On: 04/17/2024 09:16   Assessment:  Severe bilateral hip osteoarthritis  Plan: Tajon is a 63 year old male with severe bilateral hip osteoarthritis. Right more symptomatic than left. He has complete loss of joint space in the right hip joint with deformity of the femoral head and lateralization of the femoral head in the acetabulum. Patient has had years of increasing pain, stiffness and limited mobility affecting his quality of life and activities daily living. Risks,  benefits, complications of a right anterior total hip arthroplasty have been discussed with the patient patient has agreed to consent the procedure with Dr. Lorelle on 06/18/2024. We discussed the importance of smoking  cessation and he is agreeable to stop smoking prior to and after total hip arthroplasty.  The hospitalization and post-operative care and rehabilitation were also discussed. The use of perioperative antibiotics and DVT prophylaxis were discussed. The risk, benefits and alternatives to a surgical intervention were discussed at length with the patient. The patient was also advised of risks related to the medical comorbidities and elevated body mass index (BMI). A lengthy discussion took place to review the most common complications including but not limited to: deep vein thrombosis, pulmonary embolus, heart attack, stroke, infection, wound breakdown, heterotopic ossification, dislocation, numbness, leg length in-equality, intraoperative fracture, damage to nerves, tendon,muscles, arteries or other blood vessels, death and other possible complications from anesthesia. The patient was told that we will take steps to minimize these risks by using sterile technique, antibiotics and DVT prophylaxis when appropriate and follow the patient postoperatively in the office setting to monitor progress. The possibility of recurrent pain, no improvement in pain and actual worsening of pain were also discussed with the patient. The risk of dislocation following total hip replacement was discussed and potential precautions to prevent dislocation were reviewed.   All questions answered patient agrees with above plan for right anterior total hip arthroplasty.

## 2024-06-18 NOTE — Plan of Care (Signed)
  Problem: Pain Managment: Goal: General experience of comfort will improve and/or be controlled Outcome: Progressing   Problem: Safety: Goal: Ability to remain free from injury will improve Outcome: Progressing

## 2024-06-18 NOTE — Interval H&P Note (Signed)
 Patient history and physical updated. Consent reviewed including risks, benefits, and alternatives to surgery. Patient agrees with above plan to proceed with right anterior total hip arthroplasty.

## 2024-06-18 NOTE — Anesthesia Procedure Notes (Signed)
 Procedure Name: Intubation Date/Time: 06/18/2024 7:36 AM  Performed by: Mathew Bernardino RAMAN, RNPre-anesthesia Checklist: Patient identified, Emergency Drugs available, Suction available and Patient being monitored Patient Re-evaluated:Patient Re-evaluated prior to induction Oxygen Delivery Method: Circle system utilized Preoxygenation: Pre-oxygenation with 100% oxygen Induction Type: IV induction Ventilation: Mask ventilation without difficulty and Oral airway inserted - appropriate to patient size Laryngoscope Size: McGrath and 4 Grade View: Grade I Tube type: Oral Tube size: 7.5 mm Number of attempts: 1 Airway Equipment and Method: Stylet and Oral airway Placement Confirmation: ETT inserted through vocal cords under direct vision, positive ETCO2 and breath sounds checked- equal and bilateral Secured at: 22 cm Tube secured with: Tape Dental Injury: Teeth and Oropharynx as per pre-operative assessment

## 2024-06-18 NOTE — Anesthesia Postprocedure Evaluation (Signed)
 Anesthesia Post Note  Patient: [name] [name]  Procedure(s) Performed: ARTHROPLASTY, HIP, TOTAL, ANTERIOR APPROACH (Right: Hip)  Patient location during evaluation: PACU Anesthesia Type: General Level of consciousness: awake and alert Pain management: pain level controlled Vital Signs Assessment: post-procedure vital signs reviewed and stable Respiratory status: spontaneous breathing, nonlabored ventilation and respiratory function stable Cardiovascular status: blood pressure returned to baseline and stable Postop Assessment: no apparent nausea or vomiting Anesthetic complications: no   No notable events documented.   Last Vitals:  Vitals:   06/18/24 1145 06/18/24 1155  BP: 128/63 115/69  Pulse:  73  Resp: 18 16  Temp:  36.4 C  SpO2:  96%    Last Pain:  Vitals:   06/18/24 1155  TempSrc: Oral  PainSc: 3                  Fairy MARLA Aloise Copus

## 2024-06-18 NOTE — Discharge Summary (Signed)
 Physician Discharge Summary  Patient ID: ELON EOFF MRN: [id number] DOB/AGE: Jul 05, 1961 63 y.o.  Admit date: 06/18/2024 Discharge date: 06/19/2024  Admission Diagnoses:  Primary osteoarthritis of right hip [M16.11] S/P total right hip arthroplasty [Z96.641]   Discharge Diagnoses: Patient Active Problem List   Diagnosis Date Noted   S/P total right hip arthroplasty 06/18/2024   Malignant melanoma in situ [facility])    Coronary artery disease involving native coronary artery of native heart without angina pectoris 01/08/2022   S/P CABG x 2 01/08/2022   HFrEF (heart failure with reduced ejection fraction) (HCC) 01/08/2022   ICD (implantable cardioverter-defibrillator) in place 01/08/2022   History of mitral valve repair 01/08/2022   Bilateral carotid artery stenosis 01/08/2022   Hyperlipidemia LDL goal <70 01/08/2022   History of tobacco use 01/08/2022   Occlusion and stenosis of bilateral carotid arteries 10/18/2017   Atherosclerotic heart disease of native coronary artery without angina pectoris 10/18/2017   Impaired fasting glucose 10/18/2017   ICD-[employer]    Left ventricular thrombus 01/09/2011   Long term current use of anticoagulant 01/09/2011   CAD, AUTOLOGOUS BYPASS GRAFT 05/23/2009   Primary cardiomyopathy (HCC) 05/23/2009   ATRIAL FIBRILLATION 05/23/2009   Essential hypertension 04/29/2009   MURMUR 04/29/2009    Past Medical History:  Diagnosis Date   AICD (automatic cardioverter/defibrillator) present 08/25/2009   a.) enrolled in MADIT-RIT trial; s/p [employer] 100 AICD (SN: [id number]) placement 08/25/2009; b.) pulse generator changed 12/17/2019 --> [employer] EL AICD (SN: [id number]) placed   Apical mural thrombus    a.) Tx'd to resolution with warfarin   B12 deficiency    Bilateral carotid artery stenosis    Coronary artery disease    a.) LHC 05/02/2009: CTO pRCA, oLAD, LCx --> 2v revascularization 05/05/2009: LIMA-LAD, RIMA-RCA    Former smoker    GERD (gastroesophageal reflux disease)    Heart murmur    HFrEF (heart failure with reduced ejection fraction) (HCC)    History of mitral valve repair    History of tobacco use    Hyperlipidemia    Ischemic cardiomyopathy    a.) EF as low as 20%; s/p AICD placement 08/25/2009   Long term (current) use of aspirin     Malignant melanoma in situ (HCC)    Mitral valve disease    a.) s/p MVR (28-mm Sorin 3D MEMO annuloplasty ring); performed concurrently with cardiac revascularization 05/05/2009   Neuropathy    Osteoarthritis, hip, bilateral    Postoperative atrial fibrillation (HCC)    a.) s/p cardiac revascularization 05/05/2009 --> Tx'd with amiodarone   S/P CABG x 2 05/05/2009   a.) LIMA-LAD, RIMA-RCA   S/P MVR (mitral valve repair) 05/05/2009   a.) 28-mm Sorin 3D MEMO annuloplasty ring     Transfusion: none   Consultants (if any):   Discharged Condition: Improved  Hospital Course: KIRIL HIPPE is an 63 y.o. male who was admitted 06/18/2024 with a diagnosis of S/P total right hip arthroplasty and went to the operating room on 06/18/2024 and underwent the above named procedures.    Surgeries: Procedure(s): ARTHROPLASTY, HIP, TOTAL, ANTERIOR APPROACH on 06/18/2024 Patient tolerated the surgery well. Taken to PACU where she was stabilized and then transferred to the orthopedic floor.  Started on Lovenox  40 mg q 24 hrs. TEDs and SCDs applied bilaterally. Heels elevated on bed. No evidence of DVT. Negative Homan. Physical therapy started on day #1 for gait training and transfer. OT started day #1 for ADL and assisted devices.  Patient's IV was d/c on day #1. Patient was able to safely and independently complete all PT goals. PT recommending discharge to home.    On post op day #1 patient was stable and ready for discharge to home with HHPT.  Implants: Cup: Trident Tritanium Clusterhole 52/E w/x3 screws    Liner: Neutral X3 poly 36/E  Stem: Insignia #5 high offset   Head:36+0 biolox ceramic   He was given perioperative antibiotics:  Anti-infectives (From admission, onward)    Start     Dose/Rate Route Frequency Ordered Stop   06/18/24 1400  ceFAZolin  (ANCEF ) IVPB 2g/100 mL premix        2 g 200 mL/hr over 30 Minutes Intravenous Every 6 hours 06/18/24 1230 06/18/24 2139   06/18/24 0615  ceFAZolin  (ANCEF ) IVPB 2g/100 mL premix        2 g 200 mL/hr over 30 Minutes Intravenous On call to O.R. 06/18/24 9388 06/18/24 0749     .  He was given sequential compression devices, early ambulation, and Lovenox  TEDs for DVT prophylaxis.  He benefited maximally from the hospital stay and there were no complications.    Recent vital signs:  Vitals:   06/19/24 0040 06/19/24 0608  BP: (!) 91/51 133/66  Pulse: 89 96  Resp: 16 15  Temp: (!) 97.5 F (36.4 C) 97.9 F (36.6 C)  SpO2: 96% 98%    Recent laboratory studies:  Lab Results  Component Value Date   HGB 11.3 (L) 06/19/2024   HGB 14.1 06/08/2024   HGB 13.8 03/11/2024   Lab Results  Component Value Date   WBC 15.6 (H) 06/19/2024   PLT 201 06/19/2024   Lab Results  Component Value Date   INR 2.6 04/29/2013   Lab Results  Component Value Date   NA 134 (L) 06/19/2024   K 3.9 06/19/2024   CL 102 06/19/2024   CO2 25 06/19/2024   BUN 24 (H) 06/19/2024   CREATININE 0.99 06/19/2024   GLUCOSE 115 (H) 06/19/2024    Discharge Medications:   Allergies as of 06/19/2024   No Known Allergies      Medication List     STOP taking these medications    ibuprofen 200 MG tablet Commonly known as: ADVIL   meloxicam  7.5 MG tablet Commonly known as: MOBIC        TAKE these medications    acetaminophen  500 MG tablet Commonly known as: TYLENOL  Take 2 tablets (1,000 mg total) by mouth every 8 (eight) hours.   aspirin  EC 81 MG tablet Take 1 tablet (81 mg total) by mouth daily. What changed: how much to take   atorvastatin  80 MG tablet Commonly known as: LIPITOR  Take 1 tablet (80 mg  total) by mouth daily. What changed: when to take this   carvedilol  25 MG tablet Commonly known as: COREG  TAKE 1 TABLET TWICE A DAY WITH MEALS   celecoxib  100 MG capsule Commonly known as: CeleBREX  Take 1 capsule (100 mg total) by mouth 2 (two) times daily for 14 days.   docusate sodium  100 MG capsule Commonly known as: COLACE Take 1 capsule (100 mg total) by mouth 2 (two) times daily.   empagliflozin  10 MG Tabs tablet Commonly known as: Jardiance  Take 1 tablet (10 mg total) by mouth daily.   enoxaparin  40 MG/0.4ML injection Commonly known as: LOVENOX  Inject 0.4 mLs (40 mg total) into the skin daily for 14 days.   Entresto  24-26 MG Generic drug: sacubitril -valsartan  Take 1 tablet by mouth 2 (two)  times daily.   ezetimibe  10 MG tablet Commonly known as: ZETIA  TAKE 1 TABLET DAILY What changed: when to take this   FISH OIL PO Take 1 capsule by mouth in the morning.   menthol -cetylpyridinium 3 MG lozenge Commonly known as: CEPACOL Take 1 lozenge (3 mg total) by mouth as needed for sore throat.   nitroGLYCERIN  0.4 MG SL tablet Commonly known as: NITROSTAT  Place 1 tablet (0.4 mg total) under the tongue every 5 (five) minutes as needed.   ondansetron  4 MG tablet Commonly known as: ZOFRAN  Take 1 tablet (4 mg total) by mouth every 6 (six) hours as needed for nausea.   oxyCODONE  5 MG immediate release tablet Commonly known as: Roxicodone  Take 0.5-1 tablets (2.5-5 mg total) by mouth every 8 (eight) hours as needed.   spironolactone  25 MG tablet Commonly known as: ALDACTONE  TAKE 1 TABLET EVERY DAY What changed: when to take this   traMADol  50 MG tablet Commonly known as: ULTRAM  Take 1 tablet (50 mg total) by mouth every 6 (six) hours as needed for moderate pain (pain score 4-6).        Diagnostic Studies: DG HIP UNILAT W OR W/O PELVIS 2-3 VIEWS RIGHT Result Date: 06/18/2024 CLINICAL DATA:  [id number] Hx of total hip arthroplasty, right [id number] Surgery,  elective [phone]. EXAM: DG HIP (WITH OR WITHOUT PELVIS) 2-3V RIGHT COMPARISON:  None Available. FINDINGS: Several intraoperative radiographs during right hip arthroplasty are submitted. There is satisfactory alignment. Please refer to the separately issued operative report for further details. Fluoroscopy time: 38.4 seconds Radiation dose: 3.7076 mGy IMPRESSION: Intraoperative fluoroscopic documentation. Electronically Signed   By: Ree Molt M.D.   On: 06/18/2024 09:45   DG C-Arm 1-60 Min-No Report Result Date: 06/18/2024 Fluoroscopy was utilized by the requesting physician.  No radiographic interpretation.   DG C-Arm 1-60 Min-No Report Result Date: 06/18/2024 Fluoroscopy was utilized by the requesting physician.  No radiographic interpretation.   CUP PACEART REMOTE DEVICE CHECK Result Date: 06/10/2024 ICD Scheduled remote reviewed. Normal device function.  Presenting rhythm: AS/VS 4 ATR events, longest x 7 sec 1 NSVT, V-rate 213 bpmx 10 beats, V>A Next remote 91 days. [place], CVRS   Disposition: Discharge disposition: 06-Home-Health Care Svc          Follow-up Information     Charlene Debby BROCKS, PA-C Follow up in 2 week(s).   Specialties: Orthopedic Surgery, Emergency Medicine Contact information: [address] [place] [address] [phone]                  Signed: Debby BROCKS Charlene 06/19/2024, 7:59 AM

## 2024-06-18 NOTE — Anesthesia Preprocedure Evaluation (Signed)
 Anesthesia Evaluation  Patient identified by MRN, date of birth, ID band Patient awake    Reviewed: Allergy & Precautions, NPO status , Patient's Chart, lab work & pertinent test results  Airway Mallampati: III  TM Distance: <3 FB Neck ROM: full    Dental  (+) Chipped   Pulmonary neg pulmonary ROS, neg shortness of breath, former smoker   Pulmonary exam normal        Cardiovascular Exercise Tolerance: Good hypertension, + CAD, + CABG and +CHF  Normal cardiovascular exam+ Cardiac Defibrillator + Valvular Problems/Murmurs      Neuro/Psych negative neurological ROS  negative psych ROS   GI/Hepatic Neg liver ROS,GERD  Controlled,,  Endo/Other  negative endocrine ROS    Renal/GU      Musculoskeletal   Abdominal   Peds  Hematology negative hematology ROS (+)   Anesthesia Other Findings Patient has cardiac clearance for this procedure.   Past Medical History: 08/25/2009: AICD (automatic cardioverter/defibrillator) present     Comment:  a.) enrolled in MADIT-RIT trial; s/p [employer] 100 AICD (SN: [id number]) placement 08/25/2009; b.)               pulse generator changed 12/17/2019 --> [employer] EL AICD (SN: [phone]) placed No date: Apical mural thrombus     Comment:  a.) Tx'd to resolution with warfarin No date: B12 deficiency No date: Bilateral carotid artery stenosis No date: Coronary artery disease     Comment:  a.) LHC 05/02/2009: CTO pRCA, oLAD, LCx --> 2v               revascularization 05/05/2009: LIMA-LAD, RIMA-RCA No date: Former smoker No date: GERD (gastroesophageal reflux disease) No date: Heart murmur No date: HFrEF (heart failure with reduced ejection fraction) (HCC) No date: History of mitral valve repair No date: History of tobacco use No date: Hyperlipidemia No date: Ischemic cardiomyopathy     Comment:  a.) EF as low as 20%; s/p AICD  placement 08/25/2009 No date: Long term (current) use of aspirin  No date: Malignant melanoma in situ (HCC) No date: Mitral valve disease     Comment:  a.) s/p MVR (28-mm Sorin 3D MEMO annuloplasty ring);               performed concurrently with cardiac revascularization               05/05/2009 No date: Neuropathy No date: Osteoarthritis, hip, bilateral No date: Postoperative atrial fibrillation (HCC)     Comment:  a.) s/p cardiac revascularization 05/05/2009 --> Tx'd               with amiodarone 05/05/2009: S/P CABG x 2     Comment:  a.) LIMA-LAD, RIMA-RCA 05/05/2009: S/P MVR (mitral valve repair)     Comment:  a.) 28-mm Sorin 3D MEMO annuloplasty ring  Past Surgical History: 08/25/2009: CARDIAC DEFIBRILLATOR PLACEMENT; N/A     Comment:  Procedure: CARDIAC DEFIBRILLATOR PLACEMENT 07/31/2016: COLONOSCOPY WITH PROPOFOL ; N/A     Comment:  Procedure: COLONOSCOPY WITH PROPOFOL ;  Surgeon: Gladis RAYMOND Mariner, MD;  Location: [facility] ENDOSCOPY;  Service:               Endoscopy;  Laterality: N/A; 05/05/2009: CORONARY ARTERY BYPASS GRAFT; N/A  Comment:  Procedure: CORONARY ARTERY BYPASS GRAFT; Location: [facility]; Surgon: Dorise Fellers, MD 12/07/2019: ICD GENERATOR CHANGEOUT; N/A     Comment:  Procedure: ICD GENERATOR CHANGEOUT;  Surgeon: Fernande Elspeth BROCKS, MD;  Location: [facility] INVASIVE CV LAB;  Service:               Cardiovascular;  Laterality: N/A; No date: KNEE SURGERY; Bilateral     Comment:  x4 05/02/2009: LEFT HEART CATH AND CORONARY ANGIOGRAPHY; Left     Comment:  Procedure: LEFT HEART CATH AND CORONARY ANGIOGRAPHY;               Location: [facility] 05/05/2009: MITRAL VALVE REPAIR; N/A     Comment:  Procedure: MITRAL VALVE REPAIR; Location: [facility];               Surgon: Dorise Fellers, MD No date: SHOULDER ARTHROSCOPY; Bilateral  BMI    Body Mass Index: 26.44 kg/m      Reproductive/Obstetrics negative OB ROS                               Anesthesia Physical Anesthesia Plan  ASA: 4  Anesthesia Plan: General ETT   Post-op Pain Management:    Induction: Intravenous  PONV Risk Score and Plan: Ondansetron , Dexamethasone , Midazolam  and Treatment may vary due to age or medical condition  Airway Management Planned: Oral ETT  Additional Equipment:   Intra-op Plan:   Post-operative Plan: Extubation in OR  Informed Consent: I have reviewed the patients History and Physical, chart, labs and discussed the procedure including the risks, benefits and alternatives for the proposed anesthesia with the patient or authorized representative who has indicated his/her understanding and acceptance.     Dental Advisory Given  Plan Discussed with: Anesthesiologist, CRNA and Surgeon  Anesthesia Plan Comments: (Patient consented for risks of anesthesia including but not limited to:  - adverse reactions to medications - damage to eyes, teeth, lips or other oral mucosa - nerve damage due to positioning  - sore throat or hoarseness - Damage to heart, brain, nerves, lungs, other parts of body or loss of life  Patient voiced understanding and assent.)        Anesthesia Quick Evaluation

## 2024-06-18 NOTE — Discharge Instructions (Signed)

## 2024-06-18 NOTE — Transfer of Care (Signed)
 Immediate Anesthesia Transfer of Care Note  Patient: [name] [name]  Procedure(s) Performed: ARTHROPLASTY, HIP, TOTAL, ANTERIOR APPROACH (Right: Hip)  Patient Location: PACU  Anesthesia Type:General  Level of Consciousness: drowsy and patient cooperative  Airway & Oxygen Therapy: Patient Spontanous Breathing and Patient connected to face mask oxygen  Post-op Assessment: Report given to RN, Post -op Vital signs reviewed and stable, and Patient moving all extremities X 4  Post vital signs: Reviewed and stable  Last Vitals:  Vitals Value Taken Time  BP 136/71 06/18/24 10:00  Temp    Pulse 72 06/18/24 10:06  Resp 24 06/18/24 10:05  SpO2 95 % 06/18/24 10:06  Vitals shown include unfiled device data.  Last Pain:  Vitals:   06/18/24 0625  TempSrc: Tympanic         Complications: No notable events documented.

## 2024-06-18 NOTE — Op Note (Signed)
 Patient Name: [name] [name]  FMW:[id number]  Pre-Operative Diagnosis: Right hip Osteoarthritis  Post-Operative Diagnosis: (same)  Procedure: Right Total Hip Arthroplasty  Components/Implants: Cup: Trident Tritanium Clusterhole 52/E w/x3 screws    Liner: Neutral X3 poly 36/E  Stem: Insignia #5 high offset  Head:36+0 biolox ceramic  Date of Surgery: 06/18/2024  Surgeon: Arthea Sheer MD  Assistant: Debby Amber PA (present and scrubbed throughout the case, critical for assistance with exposure, retraction, instrumentation, and closure)   Anesthesiologist: Piscitello  Anesthesia: General   EBL: 500cc  IVF:1000cc  Complications: None   Brief history: The patient is a 63 year old male with a history of osteoarthritis of the right hip with pain limiting their range of motion and activities of daily living, which has failed multiple attempts at conservative therapy.  The risks and benefits of total hip arthroplasty as definitive surgical treatment were discussed with the patient, who opted to proceed with the operation.  After outpatient medical clearance and optimization was completed the patient was admitted to [facility] for the procedure.  All preoperative films were reviewed and an appropriate surgical plan was made prior to surgery.   Description of procedure: The patient was brought to the operating room where laterality was confirmed by all those present to be the right side.  The patient was administered spinal anesthesia on a stretcher prior to being moved supine on the operating room table. Patient was given an intravenous dose of antibiotics for surgical prophylaxis and TXA.  All bony prominences and extremities were well padded and the patient was securely attached to the table boots, a perineal post was placed and the patient had a safety strap placed.  Surgical site was prepped with alcohol and chlorhexidine . The surgical site over the hip was and draped  in typical sterile fashion with multiple layers of adhesive and nonadhesive drapes.  The incision site was marked out with a sterile marker and care was taken to assess the position of the ASIS and ensure appropriate position for the incision.    A surgical timeout was then called with participation of all staff in the room the patient was then a confirmed again and laterality confirmed.  Incision was made over the anterior lateral aspect of the proximal thigh in line with the TFL.  Appropriate retractors were placed and all bleeding vessels were coagulated within the subcutaneous and fatty layers.  An incision was made in the TFL fascia in the interval was carefully identified.  The lateral ascending branches of the circumflex vessels were identified, cauterized and carefully dissected. The main vessels were then tied with a 0 silk hand tie.  Retractors were placed around the superior lateral and inferior medial aspects of the femoral neck and a capsulotomy was performed exposing the hip joint.  Retraction stitches were placed and the capsulotomy to assist with visualization.  Femoral neck cut was then made and the femoral head was extracted after placing the leg in traction.  Bone wax was then applied to the proximal cut surface of the femur and water cooled bipolar electrocautery was used to address any bleeding around the femoral neck cut.  Retractors were then placed around the acetabulum to fully visualize the joint space, and the remaining labral tissue was removed and pulvinar was removed.   The acetabulum was then sequentially reamed up to the appropriate size in order to get good fit and fill for the acetabular component while under fluoroscopic guidance.  Acetabular component was then placed and malleted into  a secure fit while confirming position and abduction angle and anteversion utilizing fluoroscopy.  2 screws were then placed in the acetabular cup to assist in securing the cup in place. The cup  was irrigated,  a real neutral liner was placed, impacted, and checked for stability. The femur traction was dropped and sequentially externally rotated while performing a release of the posterior and superomedial tissues off of the proximal femur to allow for mobility, care was taken to preserve the external rotators and piriformis attachments.  The remaining interval between the abductors and the capsule was dissected out and a retractor was placed over the superolateral aspect of the femur over the greater trochanter.  The leg was carefully brought down into extension and adducted to provide visualization of the proximal femur for broaching.  The femur was then sequentially broached up to an appropriate size which provided for good fill and stability to the femoral broach.  A trial neck and head were placed on the femoral broach and the leg was brought up for reduction.  The hip was reduced and manual check of stability was performed.  The hip was found to be stable in flexion internal rotation and extension external rotation.  Leg lengths were confirmed on fluoroscopy.   The hip was then dislocated the trial neck and head were removed.  The leg was then brought down into extension and adduction in the proximal femur was reexposed.  The broach trial was removed and the femur was irrigated with normal saline prior to the real femoral stem being implanted.  After the femoral stem was seated and shown to have good fit and fill the appropriate head was impacted the leg was brought up and reduced.  There was good range of motion with stability in flexion internal rotation and extension external rotation on testing.  Leg lengths were found to be appropriate on fluoroscopic evaluation at this time.  The hip was then irrigated with betdine based surgiphor solution and then saline solution.  The capsulotomy was repaired with Ethibond sutures.  A pericapsular and peritrochanteric cocktail with Exparel  and bupivacaine  was  then injected as well as the subcutaneous tissues. The fascia was closed with a #1 barbed running suture.  The deep tissues were closed with Vicryl sutures the subcutaneous tissues were closed with interrupted Vicryl sutures and a running barbed 4-0 suture.  The skin was then reinforced with Dermabond and a sterile dressing was placed.   The patient was awoken from anesthesia transferred off of the operating room table onto a hospital bed where examination of leg lengths found the leg lengths to be longer on the right approx 8 mm compared to contralateral as planned with a good distal pulse.  The patient was then transferred to the PACU in stable condition.

## 2024-06-18 NOTE — Progress Notes (Signed)
 Blue -armband (blood band) is missing from patient's hard chart therefore, per lab protocol, type and screen have to be re-draw. Order re-entered.

## 2024-06-18 NOTE — Evaluation (Signed)
 Physical Therapy Evaluation Patient Details Name: [name] [name] MRN: [id number] DOB: 03-08-1961 Today's Date: 06/18/2024  History of Present Illness  Pt is a 63 y/o M s/p R THA direct anterior approach. PMH significant for CAD s/p CABG, AICD placement, post-op afib, ischemic cardiomyopathy with associated HFrEF, mitral valve disease, bilateral carotid artery stenosis, HLD, GERD.   Clinical Impression  Pt A&Ox4, agreeable to PT evaluation, denied pain throughout session. At baseline, pt reports being IND with mobility, no prior AD use and no assist for IADLs. Pt able to complete supine > sit transfer with supervision, HOB elevated and bed rail use. BP in seated 122/77. STS from elevated EOB with RW and CGA. Pt reported moderate lightheadedness on initial standing that improved with time. Pt able to amb ~60 ft with RW and CGA, demonstrated a wide BOS and variable R foot placement and minimal R knee flexion in swing phase. No LOB, however pt required multiple multimodal cues to maintain BOS within RW frame. Pt is currently displaying deficits in functional mobility and balance and would benefit from skilled PT intervention to address listed benefits and allow for safe return to PLOF.         If plan is discharge home, recommend the following: A little help with walking and/or transfers;A little help with bathing/dressing/bathroom;Assistance with cooking/housework;Assist for transportation;Help with stairs or ramp for entrance   Can travel by private vehicle        Equipment Recommendations None recommended by PT  Recommendations for Other Services  OT consult    Functional Status Assessment Patient has had a recent decline in their functional status and demonstrates the ability to make significant improvements in function in a reasonable and predictable amount of time.     Precautions / Restrictions Precautions Precautions: Anterior Hip;Fall;ICD/Pacemaker Precaution Booklet Issued: Yes  (comment) Recall of Precautions/Restrictions: Intact Restrictions Weight Bearing Restrictions Per Provider Order: Yes RLE Weight Bearing Per Provider Order: Weight bearing as tolerated      Mobility  Bed Mobility Overal bed mobility: Needs Assistance Bed Mobility: Supine to Sit     Supine to sit: Supervision, HOB elevated, Used rails     General bed mobility comments: no physical assist required for supine > sit    Transfers Overall transfer level: Needs assistance Equipment used: Rolling walker (2 wheels) Transfers: Sit to/from Stand Sit to Stand: Contact guard assist, From elevated surface           General transfer comment: STS from elevated EOB with RW and CGA    Ambulation/Gait Ambulation/Gait assistance: Contact guard assist Gait Distance (Feet): 60 Feet Assistive device: Rolling walker (2 wheels) Gait Pattern/deviations: Step-through pattern, Knee hyperextension - right, Wide base of support       General Gait Details: Pt demonstrated wide BOS, variable R foot placement, verbal and visual cues to maintain BOS within RW frame.  Stairs            Wheelchair Mobility     Tilt Bed    Modified Rankin (Stroke Patients Only)       Balance Overall balance assessment: Needs assistance Sitting-balance support: Feet supported Sitting balance-Leahy Scale: Good     Standing balance support: Bilateral upper extremity supported, Reliant on assistive device for balance Standing balance-Leahy Scale: Fair Standing balance comment: slightly unsteady in standing, definite need for BUE support  Pertinent Vitals/Pain Pain Assessment Pain Assessment: No/denies pain    Home Living Family/patient expects to be discharged to:: Private residence Living Arrangements: Non-relatives/Friends Available Help at Discharge: Friend(s);Available 24 hours/day Type of Home: House Home Access: Stairs to enter Entrance  Stairs-Rails: Left Entrance Stairs-Number of Steps: 4   Home Layout: One level Home Equipment: Agricultural consultant (2 wheels);Cane - single point Additional Comments: pt is planning to DC to friend's house, will be staying there during recovery    Prior Function Prior Level of Function : Independent/Modified Independent             Mobility Comments: Pt reports being IND with mobility, no prior AD use, denies falls ADLs Comments: Reports being IND with IADLs     Extremity/Trunk Assessment   Upper Extremity Assessment Upper Extremity Assessment: Overall WFL for tasks assessed    Lower Extremity Assessment Lower Extremity Assessment: RLE deficits/detail;LLE deficits/detail RLE Deficits / Details: able to move RLE against gravity, not formally assessed for MMT RLE Sensation: WNL LLE Deficits / Details: grossly 5/5 strength LLE Sensation: WNL       Communication   Communication Communication: No apparent difficulties    Cognition Arousal: Alert Behavior During Therapy: WFL for tasks assessed/performed, Impulsive   PT - Cognitive impairments: No apparent impairments                       PT - Cognition Comments: A&Ox4 Following commands: Intact       Cueing Cueing Techniques: Verbal cues, Tactile cues, Visual cues     General Comments      Exercises Total Joint Exercises Ankle Circles/Pumps: AROM, Both, 10 reps, Supine Heel Slides: AROM, Right, 10 reps, Supine Hip ABduction/ADduction: AROM, Right, 10 reps, Supine Long Arc Quad: AROM, Both, 10 reps   Assessment/Plan    PT Assessment Patient needs continued PT services  PT Problem List Decreased strength;Decreased range of motion;Decreased activity tolerance;Decreased balance;Decreased mobility;Decreased knowledge of use of DME;Decreased safety awareness;Pain       PT Treatment Interventions DME instruction;Gait training;Stair training;Functional mobility training;Therapeutic activities;Therapeutic  exercise;Balance training;Neuromuscular re-education;Patient/family education    PT Goals (Current goals can be found in the Care Plan section)  Acute Rehab PT Goals Patient Stated Goal: return home PT Goal Formulation: With patient Time For Goal Achievement: 07/02/24 Potential to Achieve Goals: Good    Frequency BID     Co-evaluation               AM-PAC PT 6 Clicks Mobility  Outcome Measure Help needed turning from your back to your side while in a flat bed without using bedrails?: None Help needed moving from lying on your back to sitting on the side of a flat bed without using bedrails?: None Help needed moving to and from a bed to a chair (including a wheelchair)?: A Little Help needed standing up from a chair using your arms (e.g., wheelchair or bedside chair)?: A Little Help needed to walk in hospital room?: A Little Help needed climbing 3-5 steps with a railing? : A Little 6 Click Score: 20    End of Session Equipment Utilized During Treatment: Gait belt Activity Tolerance: Patient tolerated treatment well Patient left: in chair;with call bell/phone within reach;with family/visitor present Nurse Communication: Mobility status PT Visit Diagnosis: Unsteadiness on feet (R26.81);Other abnormalities of gait and mobility (R26.89);Muscle weakness (generalized) (M62.81);Difficulty in walking, not elsewhere classified (R26.2);Pain Pain - Right/Left: Right Pain - part of body: Hip    Time: 8670-8597  PT Time Calculation (min) (ACUTE ONLY): 33 min   Charges:   PT Evaluation $PT Eval Low Complexity: 1 Low PT Treatments $Therapeutic Activity: 23-37 mins PT General Charges $$ ACUTE PT VISIT: 1 Visit         Janell Axe, SPT

## 2024-06-19 ENCOUNTER — Encounter: Payer: Self-pay | Admitting: Orthopedic Surgery

## 2024-06-19 DIAGNOSIS — F17210 Nicotine dependence, cigarettes, uncomplicated: Secondary | ICD-10-CM | POA: Diagnosis not present

## 2024-06-19 DIAGNOSIS — I110 Hypertensive heart disease with heart failure: Secondary | ICD-10-CM | POA: Diagnosis not present

## 2024-06-19 DIAGNOSIS — R011 Cardiac murmur, unspecified: Secondary | ICD-10-CM | POA: Diagnosis not present

## 2024-06-19 DIAGNOSIS — I2510 Atherosclerotic heart disease of native coronary artery without angina pectoris: Secondary | ICD-10-CM | POA: Diagnosis not present

## 2024-06-19 DIAGNOSIS — I5022 Chronic systolic (congestive) heart failure: Secondary | ICD-10-CM | POA: Diagnosis not present

## 2024-06-19 DIAGNOSIS — Z95810 Presence of automatic (implantable) cardiac defibrillator: Secondary | ICD-10-CM | POA: Diagnosis not present

## 2024-06-19 DIAGNOSIS — E785 Hyperlipidemia, unspecified: Secondary | ICD-10-CM | POA: Diagnosis not present

## 2024-06-19 DIAGNOSIS — I080 Rheumatic disorders of both mitral and aortic valves: Secondary | ICD-10-CM | POA: Diagnosis not present

## 2024-06-19 DIAGNOSIS — M1611 Unilateral primary osteoarthritis, right hip: Secondary | ICD-10-CM | POA: Diagnosis not present

## 2024-06-19 LAB — CBC
HCT: 32.1 % — ABNORMAL LOW (ref 39.0–52.0)
Hemoglobin: 11.3 g/dL — ABNORMAL LOW (ref 13.0–17.0)
MCH: 31.1 pg (ref 26.0–34.0)
MCHC: 35.2 g/dL (ref 30.0–36.0)
MCV: 88.4 fL (ref 80.0–100.0)
Platelets: 201 K/uL (ref 150–400)
RBC: 3.63 MIL/uL — ABNORMAL LOW (ref 4.22–5.81)
RDW: 12.7 % (ref 11.5–15.5)
WBC: 15.6 K/uL — ABNORMAL HIGH (ref 4.0–10.5)
nRBC: 0 % (ref 0.0–0.2)

## 2024-06-19 LAB — BASIC METABOLIC PANEL WITH GFR
Anion gap: 7 (ref 5–15)
BUN: 24 mg/dL — ABNORMAL HIGH (ref 8–23)
CO2: 25 mmol/L (ref 22–32)
Calcium: 8.7 mg/dL — ABNORMAL LOW (ref 8.9–10.3)
Chloride: 102 mmol/L (ref 98–111)
Creatinine, Ser: 0.99 mg/dL (ref 0.61–1.24)
GFR, Estimated: >60 mL/min (ref >60)
Glucose, Bld: 115 mg/dL — ABNORMAL HIGH (ref 70–99)
Potassium: 3.9 mmol/L (ref 3.5–5.1)
Sodium: 134 mmol/L — ABNORMAL LOW (ref 135–145)

## 2024-06-19 MED ORDER — EZETIMIBE 10 MG PO TABS
ORAL_TABLET | ORAL | Status: AC
  Filled 2024-06-19: qty 1

## 2024-06-19 NOTE — Progress Notes (Addendum)
 Physical Therapy Treatment Patient Details Name: [name] [name] MRN: [id number] DOB: 1961-04-16 Today's Date: 06/19/2024   History of Present Illness Pt is a 63 y/o M s/p R THA direct anterior approach. PMH significant for CAD s/p CABG, AICD placement, post-op afib, ischemic cardiomyopathy with associated HFrEF, mitral valve disease, bilateral carotid artery stenosis, HLD, GERD.    PT Comments  Pt A&Ox4, agreeable to participate in PT treatment. Pt cited soreness in R hip during session, no specific number given. Pt was met sitting at EOB, completed STS transfers with RW and supervision. Pt amb ~150ft with RW and CGA (provided but not required). Pt demonstrated improved R knee flexion in swing today and less variable R foot placement, minimal path variance throughout. Pt completed stairs with L hand rail assist and CGA, step-to pattern, with cues for step sequencing throughout. Extensive education provided to pt during session on importance of utilizing RW for amb/transfers post d/c to maintain stability and safe mobility, pt appeared to be receptive to education. Pt returned to bed at end of session independently, with all needs met. The patient would benefit from further skilled PT intervention to continue to progress towards goals.     If plan is discharge home, recommend the following: A little help with walking and/or transfers;A little help with bathing/dressing/bathroom;Assistance with cooking/housework;Assist for transportation;Help with stairs or ramp for entrance   Can travel by private vehicle        Equipment Recommendations  None recommended by PT (pt has all recommended equipment at home)    Recommendations for Other Services       Precautions / Restrictions Precautions Precautions: Anterior Hip Recall of Precautions/Restrictions: Intact Restrictions Weight Bearing Restrictions Per Provider Order: Yes RLE Weight Bearing Per Provider Order: Weight bearing as tolerated      Mobility  Bed Mobility Overal bed mobility: Independent             General bed mobility comments: no physical assistance, pt able to complete bed mobility with ease    Transfers Overall transfer level: Needs assistance Equipment used: Rolling walker (2 wheels) Transfers: Sit to/from Stand Sit to Stand: Supervision                Ambulation/Gait Ambulation/Gait assistance: Contact guard assist Gait Distance (Feet): 175 Feet Assistive device: Rolling walker (2 wheels) Gait Pattern/deviations: Step-through pattern, Wide base of support       General Gait Details: normal cadence, improved R knee flexion and minimal path variance   Stairs Stairs: Yes Stairs assistance: Contact guard assist Stair Management: One rail Left, Step to pattern, Forwards Number of Stairs: 4 General stair comments: Cues for step sequencing and unilateral HR assist   Wheelchair Mobility     Tilt Bed    Modified Rankin (Stroke Patients Only)       Balance Overall balance assessment: Needs assistance Sitting-balance support: Feet supported Sitting balance-Leahy Scale: Normal     Standing balance support: Bilateral upper extremity supported Standing balance-Leahy Scale: Good Standing balance comment: no LOB                            Communication Communication Communication: No apparent difficulties  Cognition Arousal: Alert Behavior During Therapy: Impulsive   PT - Cognitive impairments: No apparent impairments                       PT - Cognition Comments: A&Ox4 Following commands: Intact  Cueing Cueing Techniques: Verbal cues, Tactile cues, Visual cues  Exercises      General Comments        Pertinent Vitals/Pain Pain Assessment Pain Assessment: Faces Faces Pain Scale: Hurts a little bit Pain Location: R hip Pain Descriptors / Indicators: Sore Pain Intervention(s): Monitored during session, Repositioned    Home Living  Family/patient expects to be discharged to:: Private residence Living Arrangements: Non-relatives/Friends Available Help at Discharge: Friend(s);Available 24 hours/day Type of Home: House Home Access: Stairs to enter Entrance Stairs-Rails: Left Entrance Stairs-Number of Steps: 4   Home Layout: One level Home Equipment: Agricultural consultant (2 wheels);Cane - single point Additional Comments: pt is planning to DC to friend's house, will be staying there during recovery    Prior Function            PT Goals (current goals can now be found in the care plan section) Progress towards PT goals: Progressing toward goals    Frequency    BID      PT Plan      Co-evaluation              AM-PAC PT 6 Clicks Mobility   Outcome Measure  Help needed turning from your back to your side while in a flat bed without using bedrails?: None Help needed moving from lying on your back to sitting on the side of a flat bed without using bedrails?: None Help needed moving to and from a bed to a chair (including a wheelchair)?: None Help needed standing up from a chair using your arms (e.g., wheelchair or bedside chair)?: A Little Help needed to walk in hospital room?: A Little Help needed climbing 3-5 steps with a railing? : A Little 6 Click Score: 21    End of Session Equipment Utilized During Treatment: Gait belt Activity Tolerance: Patient tolerated treatment well Patient left: in bed;with call bell/phone within reach Nurse Communication: Mobility status PT Visit Diagnosis: Unsteadiness on feet (R26.81);Other abnormalities of gait and mobility (R26.89);Muscle weakness (generalized) (M62.81);Difficulty in walking, not elsewhere classified (R26.2);Pain Pain - Right/Left: Right Pain - part of body: Hip     Time: 9069-9057 PT Time Calculation (min) (ACUTE ONLY): 12 min  Charges:    $Therapeutic Activity: 8-22 mins PT General Charges $$ ACUTE PT VISIT: 1 Visit                      Janell Axe, SPT

## 2024-06-19 NOTE — Progress Notes (Signed)
 Subjective: 1 Day Post-Op Procedure(s) (LRB): ARTHROPLASTY, HIP, TOTAL, ANTERIOR APPROACH (Right) Patient reports pain as mild.   Patient is well, and has had no acute complaints or problems Denies any CP, SOB, ABD pain. We will continue therapy today.  Plan is to go Home after hospital stay.  Objective: Vital signs in last 24 hours: Temp:  [97.1 F (36.2 C)-98 F (36.7 C)] 97.9 F (36.6 C) (08/29 9391) Pulse Rate:  [66-96] 96 (08/29 0608) Resp:  [11-18] 15 (08/29 9391) BP: (87-136)/(51-82) 133/66 (08/29 0608) SpO2:  [88 %-100 %] 98 % (08/29 9391)  Intake/Output from previous day: 08/28 0701 - 08/29 0700 In: 2718.3 [P.O.:225; I.V.:2093.3; IV Piggyback:400] Out: 700 [Urine:300; Blood:400] Intake/Output this shift: No intake/output data recorded.  Recent Labs    06/19/24 0632  HGB 11.3*   Recent Labs    06/19/24 0632  WBC 15.6*  RBC 3.63*  HCT 32.1*  PLT 201   Recent Labs    06/19/24 0632  NA 134*  K 3.9  CL 102  CO2 25  BUN 24*  CREATININE 0.99  GLUCOSE 115*  CALCIUM  8.7*   No results for input(s): LABPT, INR in the last 72 hours.  EXAM General - Patient is Alert and Appropriate Extremity - Neurovascular intact Sensation intact distally Intact pulses distally Dressing - dressing C/D/I and no drainage Motor Function - intact, moving foot and toes well on exam.   Past Medical History:  Diagnosis Date   AICD (automatic cardioverter/defibrillator) present 08/25/2009   a.) enrolled in MADIT-RIT trial; s/p [employer] 100 AICD (SN: [id number]) placement 08/25/2009; b.) pulse generator changed 12/17/2019 --> [employer] EL AICD (SN: [id number]) placed   Apical mural thrombus    a.) Tx'd to resolution with warfarin   B12 deficiency    Bilateral carotid artery stenosis    Coronary artery disease    a.) LHC 05/02/2009: CTO pRCA, oLAD, LCx --> 2v revascularization 05/05/2009: LIMA-LAD, RIMA-RCA   Former smoker    GERD  (gastroesophageal reflux disease)    Heart murmur    HFrEF (heart failure with reduced ejection fraction) (HCC)    History of mitral valve repair    History of tobacco use    Hyperlipidemia    Ischemic cardiomyopathy    a.) EF as low as 20%; s/p AICD placement 08/25/2009   Long term (current) use of aspirin     Malignant melanoma in situ (HCC)    Mitral valve disease    a.) s/p MVR (28-mm Sorin 3D MEMO annuloplasty ring); performed concurrently with cardiac revascularization 05/05/2009   Neuropathy    Osteoarthritis, hip, bilateral    Postoperative atrial fibrillation (HCC)    a.) s/p cardiac revascularization 05/05/2009 --> Tx'd with amiodarone   S/P CABG x 2 05/05/2009   a.) LIMA-LAD, RIMA-RCA   S/P MVR (mitral valve repair) 05/05/2009   a.) 28-mm Sorin 3D MEMO annuloplasty ring    Assessment/Plan:   1 Day Post-Op Procedure(s) (LRB): ARTHROPLASTY, HIP, TOTAL, ANTERIOR APPROACH (Right) Principal Problem:   S/P total right hip arthroplasty Active Problems:   Malignant melanoma in situ (HCC)  Estimated body mass index is 26.44 kg/m as calculated from the following:   Height as of this encounter: 5' 10 (1.778 m).   Weight as of this encounter: 83.6 kg. Advance diet Up with therapy Pain well controlled Labs and VSS CM to assist with discharge to home with HHPT today  DVT Prophylaxis - Lovenox , TED hose, and SCDs Weight-Bearing as tolerated to  Right leg   T. Medford Amber, PA-C [facility] 06/19/2024, 7:58 AM

## 2024-06-19 NOTE — Plan of Care (Signed)
   Problem: Activity: Goal: Ability to avoid complications of mobility impairment will improve Outcome: Progressing   Problem: Clinical Measurements: Goal: Postoperative complications will be avoided or minimized Outcome: Progressing   Problem: Pain Management: Goal: Pain level will decrease with appropriate interventions Outcome: Progressing

## 2024-06-19 NOTE — Evaluation (Signed)
 Occupational Therapy Evaluation Patient Details Name: [name] [name] MRN: [id number] DOB: 11-05-1960 Today's Date: 06/19/2024   History of Present Illness   Pt is a 63 y/o M s/p R THA direct anterior approach. PMH significant for CAD s/p CABG, AICD placement, post-op afib, ischemic cardiomyopathy with associated HFrEF, mitral valve disease, bilateral carotid artery stenosis, HLD, GERD.   Clinical Impressions [name] [name] was seen for OT evaluation this date. Prior to hospital admission, pt was IND. Pt lives alone, plan to d/c to friends house. Pt currently requires SUPERVISION don UB and LB clothing in standing. SUPERVISION for ADL t/f ~80 ft, cues for safety. Pt noted to be impulsive with intermittent furniture walking noted - educated on importance of RW and slow turns for falls prevention. No skilled acute OT needs identified, will sign off. Upon hospital discharge, recommend no OT follow up.     If plan is discharge home, recommend the following:   Help with stairs or ramp for entrance     Functional Status Assessment   Patient has had a recent decline in their functional status and demonstrates the ability to make significant improvements in function in a reasonable and predictable amount of time.     Equipment Recommendations   None recommended by OT     Recommendations for Other Services         Precautions/Restrictions   Precautions Precautions: Anterior Hip Recall of Precautions/Restrictions: Intact Restrictions Weight Bearing Restrictions Per Provider Order: Yes RLE Weight Bearing Per Provider Order: Weight bearing as tolerated     Mobility Bed Mobility Overal bed mobility: Independent                  Transfers Overall transfer level: Needs assistance Equipment used: None Transfers: Sit to/from Stand Sit to Stand: Supervision                  Balance Overall balance assessment: Needs assistance Sitting-balance support: Feet  supported Sitting balance-Leahy Scale: Normal     Standing balance support: No upper extremity supported, During functional activity Standing balance-Leahy Scale: Fair                             ADL either performed or assessed with clinical judgement   ADL Overall ADL's : Needs assistance/impaired                                       General ADL Comments: SUPERVISION don UB and LB clothing in standing. SUPERVISION for ADL t/f ~80 ft, cues for safety     Vision         Perception         Praxis         Pertinent Vitals/Pain Pain Assessment Pain Assessment: 0-10 Pain Score: 2  Pain Location: operative site Pain Descriptors / Indicators: Aching, Sore Pain Intervention(s): Limited activity within patient's tolerance     Extremity/Trunk Assessment Upper Extremity Assessment Upper Extremity Assessment: Overall WFL for tasks assessed   Lower Extremity Assessment Lower Extremity Assessment: Overall WFL for tasks assessed       Communication Communication Communication: No apparent difficulties   Cognition Arousal: Alert Behavior During Therapy: Impulsive Cognition: No apparent impairments  Following commands: Intact       Cueing  General Comments          Exercises     Shoulder Instructions      Home Living Family/patient expects to be discharged to:: Private residence Living Arrangements: Non-relatives/Friends Available Help at Discharge: Friend(s);Available 24 hours/day Type of Home: House Home Access: Stairs to enter [employer] of Steps: 4 Entrance Stairs-Rails: Left Home Layout: One level     Bathroom Shower/Tub: Producer, television/film/video: Handicapped height     Home Equipment: Agricultural consultant (2 wheels);Cane - single point   Additional Comments: pt is planning to DC to friend's house, will be staying there during recovery      Prior  Functioning/Environment Prior Level of Function : Independent/Modified Independent             Mobility Comments: Pt reports being IND with mobility, no prior AD use, denies falls ADLs Comments: Reports being IND with IADLs    OT Problem List: Decreased activity tolerance;Impaired balance (sitting and/or standing)        OT Goals(Current goals can be found in the care plan section)   Acute Rehab OT Goals Patient Stated Goal: to go home OT Goal Formulation: With patient Time For Goal Achievement: 06/19/24 Potential to Achieve Goals: Good   AM-PAC OT 6 Clicks Daily Activity     Outcome Measure Help from another person eating meals?: None Help from another person taking care of personal grooming?: None Help from another person toileting, which includes using toliet, bedpan, or urinal?: None Help from another person bathing (including washing, rinsing, drying)?: A Little Help from another person to put on and taking off regular upper body clothing?: None Help from another person to put on and taking off regular lower body clothing?: None 6 Click Score: 23   End of Session    Activity Tolerance: Patient tolerated treatment well Patient left: in bed;with call bell/phone within reach  OT Visit Diagnosis: Other abnormalities of gait and mobility (R26.89)                Time: 9158-9146 OT Time Calculation (min): 12 min Charges:  OT General Charges $OT Visit: 1 Visit OT Evaluation $OT Eval Low Complexity: 1 Low  [name] [name], M.S. OTR/L  06/19/24, 9:05 AM  ascom [phone]

## 2024-06-20 DIAGNOSIS — I2510 Atherosclerotic heart disease of native coronary artery without angina pectoris: Secondary | ICD-10-CM | POA: Diagnosis not present

## 2024-06-20 DIAGNOSIS — Z96641 Presence of right artificial hip joint: Secondary | ICD-10-CM | POA: Diagnosis not present

## 2024-06-20 DIAGNOSIS — I5022 Chronic systolic (congestive) heart failure: Secondary | ICD-10-CM | POA: Diagnosis not present

## 2024-06-20 DIAGNOSIS — I255 Ischemic cardiomyopathy: Secondary | ICD-10-CM | POA: Diagnosis not present

## 2024-06-20 DIAGNOSIS — Z471 Aftercare following joint replacement surgery: Secondary | ICD-10-CM | POA: Diagnosis not present

## 2024-06-20 DIAGNOSIS — M1612 Unilateral primary osteoarthritis, left hip: Secondary | ICD-10-CM | POA: Diagnosis not present

## 2024-06-20 DIAGNOSIS — I110 Hypertensive heart disease with heart failure: Secondary | ICD-10-CM | POA: Diagnosis not present

## 2024-06-20 DIAGNOSIS — I4891 Unspecified atrial fibrillation: Secondary | ICD-10-CM | POA: Diagnosis not present

## 2024-06-20 DIAGNOSIS — G629 Polyneuropathy, unspecified: Secondary | ICD-10-CM | POA: Diagnosis not present

## 2024-06-23 DIAGNOSIS — I2510 Atherosclerotic heart disease of native coronary artery without angina pectoris: Secondary | ICD-10-CM | POA: Diagnosis not present

## 2024-06-23 DIAGNOSIS — I110 Hypertensive heart disease with heart failure: Secondary | ICD-10-CM | POA: Diagnosis not present

## 2024-06-23 DIAGNOSIS — M1612 Unilateral primary osteoarthritis, left hip: Secondary | ICD-10-CM | POA: Diagnosis not present

## 2024-06-23 DIAGNOSIS — Z96641 Presence of right artificial hip joint: Secondary | ICD-10-CM | POA: Diagnosis not present

## 2024-06-23 DIAGNOSIS — I4891 Unspecified atrial fibrillation: Secondary | ICD-10-CM | POA: Diagnosis not present

## 2024-06-23 DIAGNOSIS — I5022 Chronic systolic (congestive) heart failure: Secondary | ICD-10-CM | POA: Diagnosis not present

## 2024-06-23 DIAGNOSIS — G629 Polyneuropathy, unspecified: Secondary | ICD-10-CM | POA: Diagnosis not present

## 2024-06-23 DIAGNOSIS — Z471 Aftercare following joint replacement surgery: Secondary | ICD-10-CM | POA: Diagnosis not present

## 2024-06-23 DIAGNOSIS — I255 Ischemic cardiomyopathy: Secondary | ICD-10-CM | POA: Diagnosis not present

## 2024-06-25 DIAGNOSIS — I4891 Unspecified atrial fibrillation: Secondary | ICD-10-CM | POA: Diagnosis not present

## 2024-06-25 DIAGNOSIS — I2510 Atherosclerotic heart disease of native coronary artery without angina pectoris: Secondary | ICD-10-CM | POA: Diagnosis not present

## 2024-06-25 DIAGNOSIS — Z96641 Presence of right artificial hip joint: Secondary | ICD-10-CM | POA: Diagnosis not present

## 2024-06-25 DIAGNOSIS — Z471 Aftercare following joint replacement surgery: Secondary | ICD-10-CM | POA: Diagnosis not present

## 2024-06-25 DIAGNOSIS — G629 Polyneuropathy, unspecified: Secondary | ICD-10-CM | POA: Diagnosis not present

## 2024-06-25 DIAGNOSIS — I5022 Chronic systolic (congestive) heart failure: Secondary | ICD-10-CM | POA: Diagnosis not present

## 2024-06-25 DIAGNOSIS — I255 Ischemic cardiomyopathy: Secondary | ICD-10-CM | POA: Diagnosis not present

## 2024-06-25 DIAGNOSIS — M1612 Unilateral primary osteoarthritis, left hip: Secondary | ICD-10-CM | POA: Diagnosis not present

## 2024-06-25 DIAGNOSIS — I110 Hypertensive heart disease with heart failure: Secondary | ICD-10-CM | POA: Diagnosis not present

## 2024-06-26 DIAGNOSIS — I2510 Atherosclerotic heart disease of native coronary artery without angina pectoris: Secondary | ICD-10-CM | POA: Diagnosis not present

## 2024-06-26 DIAGNOSIS — Z96641 Presence of right artificial hip joint: Secondary | ICD-10-CM | POA: Diagnosis not present

## 2024-06-26 DIAGNOSIS — I4891 Unspecified atrial fibrillation: Secondary | ICD-10-CM | POA: Diagnosis not present

## 2024-06-26 DIAGNOSIS — G629 Polyneuropathy, unspecified: Secondary | ICD-10-CM | POA: Diagnosis not present

## 2024-06-26 DIAGNOSIS — I110 Hypertensive heart disease with heart failure: Secondary | ICD-10-CM | POA: Diagnosis not present

## 2024-06-26 DIAGNOSIS — I5022 Chronic systolic (congestive) heart failure: Secondary | ICD-10-CM | POA: Diagnosis not present

## 2024-06-26 DIAGNOSIS — M1612 Unilateral primary osteoarthritis, left hip: Secondary | ICD-10-CM | POA: Diagnosis not present

## 2024-06-26 DIAGNOSIS — Z471 Aftercare following joint replacement surgery: Secondary | ICD-10-CM | POA: Diagnosis not present

## 2024-06-26 DIAGNOSIS — I255 Ischemic cardiomyopathy: Secondary | ICD-10-CM | POA: Diagnosis not present

## 2024-06-28 ENCOUNTER — Ambulatory Visit: Payer: Self-pay | Admitting: Cardiology

## 2024-06-30 DIAGNOSIS — I2510 Atherosclerotic heart disease of native coronary artery without angina pectoris: Secondary | ICD-10-CM | POA: Diagnosis not present

## 2024-06-30 DIAGNOSIS — I4891 Unspecified atrial fibrillation: Secondary | ICD-10-CM | POA: Diagnosis not present

## 2024-06-30 DIAGNOSIS — I255 Ischemic cardiomyopathy: Secondary | ICD-10-CM | POA: Diagnosis not present

## 2024-06-30 DIAGNOSIS — M1612 Unilateral primary osteoarthritis, left hip: Secondary | ICD-10-CM | POA: Diagnosis not present

## 2024-06-30 DIAGNOSIS — Z96641 Presence of right artificial hip joint: Secondary | ICD-10-CM | POA: Diagnosis not present

## 2024-06-30 DIAGNOSIS — G629 Polyneuropathy, unspecified: Secondary | ICD-10-CM | POA: Diagnosis not present

## 2024-06-30 DIAGNOSIS — I5022 Chronic systolic (congestive) heart failure: Secondary | ICD-10-CM | POA: Diagnosis not present

## 2024-06-30 DIAGNOSIS — I110 Hypertensive heart disease with heart failure: Secondary | ICD-10-CM | POA: Diagnosis not present

## 2024-06-30 DIAGNOSIS — Z471 Aftercare following joint replacement surgery: Secondary | ICD-10-CM | POA: Diagnosis not present

## 2024-07-01 ENCOUNTER — Ambulatory Visit: Admitting: Dermatology

## 2024-07-01 ENCOUNTER — Encounter: Payer: Self-pay | Admitting: Dermatology

## 2024-07-01 VITALS — BP 150/70 | HR 68

## 2024-07-01 DIAGNOSIS — Z86007 Personal history of in-situ neoplasm of skin: Secondary | ICD-10-CM

## 2024-07-01 DIAGNOSIS — L905 Scar conditions and fibrosis of skin: Secondary | ICD-10-CM | POA: Diagnosis not present

## 2024-07-01 DIAGNOSIS — C439 Malignant melanoma of skin, unspecified: Secondary | ICD-10-CM

## 2024-07-01 NOTE — Patient Instructions (Signed)

## 2024-07-01 NOTE — Progress Notes (Signed)
   Follow Up Visit   Subjective  [name] [name] is a 63 y.o. male who presents for the following: follow up from Mohs surgery   The patient presents for follow up from Mohs surgery for a MIS on the left shoulder, treated on 06/03/24, repaired with linear closure. The patient has been bandaging the wound as directed. The endorse the following concerns: No questions or concerns   The following portions of the chart were reviewed this encounter and updated as appropriate: medications, allergies, medical history  Review of Systems:  No other skin or systemic complaints except as noted in HPI or Assessment and Plan.  Objective  Well appearing patient in no apparent distress; mood and affect are within normal limits.  A focal examination was performed including scalp, head, face and left arm. All findings within normal limits unless otherwise noted below.  Healing wound with mild erythema  Relevant physical exam findings are noted in the Assessment and Plan.    Assessment & Plan   Scar s/p Mohs for MIS on the left shoulder, treated on 06/03/24, repaired with linear closure - Reassured that wound is healing well - No evidence of infection - No swelling, induration, purulence, dehiscence, or tenderness out of proportion to the clinical exam, see photo above - Discussed that scars take up to 12 months to mature from the date of surgery - Recommend SPF 30+ to scar daily to prevent purple color from UV exposure during scar maturation process - Discussed that erythema and raised appearance of scar will fade over the next 4-6 months - OK to start scar massage at 4-6 weeks post-op - Can consider silicone based products for scar healing starting at 6 weeks post-op - Ok to discontinue ointment daily to wound    HISTORY OF MELANOMA IN SITU - No evidence of recurrence today - Recommend regular full body skin exams - Recommend daily broad spectrum sunscreen SPF 30+ to sun-exposed areas, reapply  every 2 hours as needed.  - Call if any new or changing lesions are noted between office visits  Return if symptoms worsen or fail to improve.  I, Berwyn Lesches, Surg Tech III, am acting as scribe for [name] CHRISTELLA HOLY, MD.   Documentation: I have reviewed the above documentation for accuracy and completeness, and I agree with the above.  [name] CHRISTELLA HOLY, MD

## 2024-07-03 DIAGNOSIS — M1612 Unilateral primary osteoarthritis, left hip: Secondary | ICD-10-CM | POA: Diagnosis not present

## 2024-07-03 DIAGNOSIS — I255 Ischemic cardiomyopathy: Secondary | ICD-10-CM | POA: Diagnosis not present

## 2024-07-03 DIAGNOSIS — I110 Hypertensive heart disease with heart failure: Secondary | ICD-10-CM | POA: Diagnosis not present

## 2024-07-03 DIAGNOSIS — Z471 Aftercare following joint replacement surgery: Secondary | ICD-10-CM | POA: Diagnosis not present

## 2024-07-03 DIAGNOSIS — I4891 Unspecified atrial fibrillation: Secondary | ICD-10-CM | POA: Diagnosis not present

## 2024-07-03 DIAGNOSIS — I5022 Chronic systolic (congestive) heart failure: Secondary | ICD-10-CM | POA: Diagnosis not present

## 2024-07-03 DIAGNOSIS — G629 Polyneuropathy, unspecified: Secondary | ICD-10-CM | POA: Diagnosis not present

## 2024-07-03 DIAGNOSIS — I2510 Atherosclerotic heart disease of native coronary artery without angina pectoris: Secondary | ICD-10-CM | POA: Diagnosis not present

## 2024-07-03 DIAGNOSIS — Z96641 Presence of right artificial hip joint: Secondary | ICD-10-CM | POA: Diagnosis not present

## 2024-07-09 ENCOUNTER — Ambulatory Visit (INDEPENDENT_AMBULATORY_CARE_PROVIDER_SITE_OTHER)

## 2024-07-09 DIAGNOSIS — E538 Deficiency of other specified B group vitamins: Secondary | ICD-10-CM

## 2024-07-09 MED ORDER — CYANOCOBALAMIN 1000 MCG/ML IJ SOLN
1000.0000 ug | Freq: Once | INTRAMUSCULAR | Status: AC
  Administered 2024-07-09: 1000 ug via INTRAMUSCULAR

## 2024-07-09 NOTE — Progress Notes (Signed)
 Patient is in office today for a nurse visit for B12 Injection. Patient Injection was given in the  Left deltoid. Patient tolerated injection well.

## 2024-07-15 NOTE — Progress Notes (Signed)
Remote ICD Transmission.

## 2024-07-29 NOTE — Progress Notes (Unsigned)
 Electrophysiology Clinic Note    Date:  07/30/2024  Patient ID:  [name] [name], [name] [name] 01-Jan-1961, MRN [id number] PCP:  Gareth Mliss FALCON, FNP  Cardiologist:  Deatrice Cage, MD  Electrophysiologist:  Elspeth Sage, MD  Electrophysiology APP:  Hudsyn Champine, NP    Discussed the use of AI scribe software for clinical note transcription with the patient, who gave verbal consent to proceed.   Patient Profile    Chief Complaint: yearly ICD follow-up  History of Present Illness: [name] [name] is a 63 y.o. male with PMH notable for ICM s/p ICD, CAD s/p CABG, MVr, HFrEF, prior apical mural thrombus s/p coumadin , carotid arter disease, HLD, tobacco use ; seen today for Elspeth Sage, MD for routine electrophysiology followup.    He last saw Dr. Sage 07/2023 where he was doing well. He last saw PA Dunn 04/2024 where he was also doing well, functional status limited by L hip, planning for hip replacement  On follow-up today, he is doing very well from a cardiac standpoint. He is continuing to recover from his R hip replacement and planning for L hip replacement in the future.  He denies chest pain, chest pressure, palpitations. No dizziness, LH, presyncope. Activity is limited by hip pain.      Arrhythmia/Device History Bos Sci dual chamber ICD, imp 2010; dx ICM - MDT RA lead - gen change 2021    ROS:  Please see the history of present illness. All other systems are reviewed and otherwise negative.    Physical Exam    VS:  BP 110/62 (BP Location: Left Arm, Patient Position: Sitting, Cuff Size: Normal)   Pulse 69   Ht 5' 10 (1.778 m)   Wt 183 lb (83 kg)   SpO2 98%   BMI 26.26 kg/m  BMI: Body mass index is 26.26 kg/m.           Wt Readings from Last 3 Encounters:  07/30/24 183 lb (83 kg)  06/18/24 184 lb 4.9 oz (83.6 kg)  06/08/24 184 lb 6.4 oz (83.6 kg)     GEN- The patient is well appearing, alert and oriented x 3 today.   Lungs- Clear to ausculation bilaterally,  normal work of breathing.  Heart- Regular rate and rhythm, no murmurs, rubs or gallops Extremities- No peripheral edema, warm, dry Skin-  device pocket well-healed, no tethering   Device interrogation done today and reviewed by myself:  Battery 10 years Lead thresholds, impedence, sensing stable  1 very brief NSVT episode 3-4h Aflutter episode 9/20 VP < 1 % No changes made today   Studies Reviewed   Previous EP, cardiology notes.    EKG is ordered. Personal review of EKG from today shows:    EKG Interpretation Date/Time:  Thursday July 30 2024 10:13:47 EDT Ventricular Rate:  69 PR Interval:  152 QRS Duration:  106 QT Interval:  414 QTC Calculation: 443 R Axis:   49  Text Interpretation: Sinus rhythm with Premature supraventricular complexes When compared with ECG of 22-Apr-2024 08:06, Premature supraventricular complexes are now Present Confirmed by [name] [phone]) on 07/30/2024 10:18:42 AM    Limited TTE, 01/23/2023  1. Left ventricular ejection fraction, by estimation, is 30 to 35%. The left ventricle has moderately decreased function. The left ventricle demonstrates global hypokinesis. There is akinesis of the left ventricular, apical segment.   2. Right ventricular systolic function is normal.   3. Mild mitral valve regurgitation.   4. Aortic valve regurgitation is mild  to moderate.   Comparison(s): 01/01/22 W Def. 30-35%. RWMA including aneurysmal apex. Dil LV/RV, RV fxn down, MV Ring (from 2010), mild AI, LAE.    TTE, 04/03/2022  1. Left ventricular ejection fraction, by estimation, is 30 to 35%. The left ventricle has moderately decreased function. The left ventricle demonstrates regional wall motion abnormalities (Hypokinesis of teh mid to distal anterior and septal wall with akinetic and aneurysmal appearing periapical region). The left ventricular internal cavity size was mildly dilated. Left ventricular diastolic parameters are consistent with Grade II  diastolic dysfunction (pseudonormalization).No LV thrombus noted.   2. Right ventricular systolic function is mildly reduced. The right ventricular size is mildly enlarged.   3. Left atrial size was moderately dilated.   4. The mitral valve has been repaired/replaced. Mild mitral valve regurgitation. Moderate mitral annular calcification. There is a prosthetic annuloplasty ring present in the mitral position. Procedure Date: 2010.   5. The aortic valve was not well visualized. Aortic valve regurgitation is mild.   TTE, 06/06/2021  1. Left ventricular ejection fraction, by estimation, is 30 to 35%. The left ventricle has moderately decreased function. The left ventricle demonstrates regional wall motion abnormalities (see scoring diagram/findings for description). Left ventricular diastolic function could not be evaluated.   2. Right ventricular systolic function is mildly reduced. The right ventricular size is normal. There is normal pulmonary artery systolic pressure.   3. Left atrial size was mild to moderately dilated.   4. The mitral valve has been repaired/replaced. Mild mitral valve regurgitation. No evidence of mitral stenosis. There is a prosthetic annuloplasty ring present in the mitral position. Procedure Date: 2010.   5. The aortic valve is tricuspid. Aortic valve regurgitation is mild to moderate. No aortic stenosis is present.   6. The inferior vena cava is normal in size with greater than 50% respiratory variability, suggesting right atrial pressure of 3 mmHg.   Comparison(s): H/O LV thrombus.     Assessment and Plan     #) ICM s/p ICD Device functioning well, see paceart for details No sustained ventricular arrhythmias Low VP  #) HFrEF Appears warm and euvolemic on exam Most recent TTE with LVEF 30-35% QRS remains narrow, not candidate for device upgrade Continue follow-up with gen cards   #) subclinical Aflutter Several aflutter episodes 9/20 totally 3-4 hours.   Patient asymptomatic Chadsvasc score = 3, no h/o CVA Continue to monitor via PPM        Current medicines are reviewed at length with the patient today.   The patient does not have concerns regarding his medicines.  The following changes were made today:  none  Labs/ tests ordered today include:  Orders Placed This Encounter  Procedures   EKG 12-Lead     Disposition: Follow up with Dr. Kennyth or EP APP in 12 months, prefer MD to establish care with new provider   Signed, Chantal Needle, NP  07/30/24  11:08 AM  Electrophysiology [facility]

## 2024-07-30 ENCOUNTER — Ambulatory Visit: Attending: Cardiology | Admitting: Cardiology

## 2024-07-30 VITALS — BP 110/62 | HR 69 | Ht 70.0 in | Wt 183.0 lb

## 2024-07-30 DIAGNOSIS — I4891 Unspecified atrial fibrillation: Secondary | ICD-10-CM | POA: Diagnosis not present

## 2024-07-30 DIAGNOSIS — I5020 Unspecified systolic (congestive) heart failure: Secondary | ICD-10-CM | POA: Diagnosis not present

## 2024-07-30 DIAGNOSIS — I255 Ischemic cardiomyopathy: Secondary | ICD-10-CM | POA: Diagnosis not present

## 2024-07-30 DIAGNOSIS — Z95810 Presence of automatic (implantable) cardiac defibrillator: Secondary | ICD-10-CM | POA: Diagnosis not present

## 2024-07-30 LAB — CUP PACEART INCLINIC DEVICE CHECK
Date Time Interrogation Session: 20251009111729
HighPow Impedance: 49 Ohm
Implantable Lead Connection Status: 753985
Implantable Lead Connection Status: 753985
Implantable Lead Implant Date: 20101104
Implantable Lead Implant Date: 20101104
Implantable Lead Location: 753859
Implantable Lead Location: 753860
Implantable Lead Model: 158
Implantable Lead Model: 5076
Implantable Lead Serial Number: 301087
Implantable Pulse Generator Implant Date: 20210215
Lead Channel Impedance Value: 487 Ohm
Lead Channel Impedance Value: 535 Ohm
Lead Channel Pacing Threshold Amplitude: 0.6 V
Lead Channel Pacing Threshold Amplitude: 0.9 V
Lead Channel Pacing Threshold Pulse Width: 0.4 ms
Lead Channel Pacing Threshold Pulse Width: 0.4 ms
Lead Channel Sensing Intrinsic Amplitude: 25 mV
Lead Channel Sensing Intrinsic Amplitude: 3.6 mV
Lead Channel Setting Pacing Amplitude: 2 V
Lead Channel Setting Pacing Amplitude: 2.5 V
Lead Channel Setting Pacing Pulse Width: 0.4 ms
Lead Channel Setting Sensing Sensitivity: 0.5 mV
Pulse Gen Serial Number: 218699

## 2024-07-30 NOTE — Patient Instructions (Signed)
 Medication Instructions:  Your physician recommends that you continue on your current medications as directed. Please refer to the Current Medication list given to you today.   *If you need a refill on your cardiac medications before your next appointment, please call your pharmacy*  Lab Work: No labs ordered today  If you have labs (blood work) drawn today and your tests are completely normal, you will receive your results only by: MyChart Message (if you have MyChart) OR A paper copy in the mail If you have any lab test that is abnormal or we need to change your treatment, we will call you to review the results.  Testing/Procedures: No test ordered today   Follow-Up: At [facility], you and your health needs are our priority.  As part of our continuing mission to provide you with exceptional heart care, our providers are all part of one team.  This team includes your primary Cardiologist (physician) and Advanced Practice Providers or APPs (Physician Assistants and Nurse Practitioners) who all work together to provide you with the care you need, when you need it.  Your next appointment:   1 year(s)  Provider:   Chantal Needle, NP or Fonda Kitty, MD   We recommend signing up for the patient portal called MyChart.  Sign up information is provided on this After Visit Summary.  MyChart is used to connect with patients for Virtual Visits (Telemedicine).  Patients are able to view lab/test results, encounter notes, upcoming appointments, etc.  Non-urgent messages can be sent to your provider as well.   To learn more about what you can do with MyChart, go to [email or web address].

## 2024-07-31 ENCOUNTER — Telehealth: Payer: Self-pay | Admitting: Cardiovascular Disease

## 2024-07-31 DIAGNOSIS — M1612 Unilateral primary osteoarthritis, left hip: Secondary | ICD-10-CM | POA: Diagnosis not present

## 2024-07-31 MED ORDER — EZETIMIBE 10 MG PO TABS: 10.0000 mg | ORAL_TABLET | Freq: Every day | ORAL | 2 refills | Status: AC

## 2024-07-31 NOTE — Telephone Encounter (Signed)
*  STAT* If patient is at the pharmacy, call can be transferred to refill team.   1. Which medications need to be refilled? (please list name of each medication and dose if known)  ezetimibe  (ZETIA ) 10 MG tablet    2. Would you like to learn more about the convenience, safety, & potential cost savings by using the [facility]?      3. Are you open to using the [facility] (Type [facility]. ).   4. Which pharmacy/location (including street and city if local pharmacy) is medication to be sent to? [facility] - [place], [place] - [address]    5. Do they need a 30 day or 90 day supply? 90 day

## 2024-07-31 NOTE — Telephone Encounter (Signed)
 Pt's medication was sent to pt's pharmacy as requested. Confirmation received.

## 2024-08-03 ENCOUNTER — Ambulatory Visit: Payer: Self-pay | Admitting: Cardiology

## 2024-08-11 ENCOUNTER — Encounter: Payer: Self-pay | Admitting: Physician Assistant

## 2024-08-11 ENCOUNTER — Ambulatory Visit: Admitting: Physician Assistant

## 2024-08-11 VITALS — BP 135/69 | HR 58

## 2024-08-11 DIAGNOSIS — D1801 Hemangioma of skin and subcutaneous tissue: Secondary | ICD-10-CM

## 2024-08-11 DIAGNOSIS — Z86006 Personal history of melanoma in-situ: Secondary | ICD-10-CM | POA: Diagnosis not present

## 2024-08-11 DIAGNOSIS — D229 Melanocytic nevi, unspecified: Secondary | ICD-10-CM

## 2024-08-11 DIAGNOSIS — Z1283 Encounter for screening for malignant neoplasm of skin: Secondary | ICD-10-CM | POA: Diagnosis not present

## 2024-08-11 DIAGNOSIS — L578 Other skin changes due to chronic exposure to nonionizing radiation: Secondary | ICD-10-CM

## 2024-08-11 DIAGNOSIS — L814 Other melanin hyperpigmentation: Secondary | ICD-10-CM

## 2024-08-11 DIAGNOSIS — L821 Other seborrheic keratosis: Secondary | ICD-10-CM

## 2024-08-11 NOTE — Patient Instructions (Signed)

## 2024-08-11 NOTE — Progress Notes (Signed)
   Total Body Skin Exam (TBSE) Visit   Subjective  [name] [name] is a 63 y.o. male ESTABLISHED PATIENT who presents for the following: Skin Cancer Screening and Full Body Skin Exam  Patient presents today for follow up visit for TBSE. Patient was last evaluated on 05/07/24 with Reynalda Canny and subsequently on 07/01/24 with Dr. Paci for surgical excision of a biopsy proven melanoma in situ on his left shoulder. Here for close follow up due to recent diagnosis of melanoma.    The following portions of the chart were reviewed this encounter and updated as appropriate: medications, allergies, medical history  Review of Systems:  No other skin or systemic complaints except as noted in HPI or Assessment and Plan.  Objective  Well appearing patient in no apparent distress; mood and affect are within normal limits.  A full examination was performed including scalp, head, eyes, ears, nose, lips, neck, chest, axillae, abdomen, back, buttocks, bilateral upper extremities, bilateral lower extremities, hands, feet, fingers, toes, fingernails, and toenails. All findings within normal limits unless otherwise noted below.   NO PALPABLE CERVICAL, AXILLARY OR INGUINAL LYMPHADENOPATHY.   Relevant physical exam findings are noted in the Assessment and Plan.    Assessment & Plan   LENTIGINES, SEBORRHEIC KERATOSES, HEMANGIOMAS - Benign normal skin lesions - Benign-appearing - Call for any changes  MELANOCYTIC NEVI - Tan-brown and/or pink-flesh-colored symmetric macules and papules - Benign appearing on exam today - Observation - Call clinic for new or changing moles - Recommend daily use of broad spectrum spf 30+ sunscreen to sun-exposed areas.   ACTINIC DAMAGE - Chronic condition, secondary to cumulative UV/sun exposure - diffuse scaly erythematous macules with underlying dyspigmentation - Recommend daily broad spectrum sunscreen SPF 30+ to sun-exposed areas, reapply every 2 hours as needed.  - Staying  in the shade or wearing long sleeves, sun glasses (UVA+UVB protection) and wide brim hats (4-inch brim around the entire circumference of the hat) are also recommended for sun protection.  - Call for new or changing lesions.  SKIN CANCER SCREENING PERFORMED TODAY.  HISTORY OF MELANOMA IN SITU  - No evidence of recurrence today - No lymphadenopathy - Recommend regular full body skin exams - Recommend daily broad spectrum sunscreen SPF 30+ to sun-exposed areas, reapply every 2 hours as needed.  - Call if any new or changing lesions are noted between office visits     HISTORY OF MELANOMA IN SITU   SCREENING EXAM FOR SKIN CANCER   LENTIGINES   CHERRY ANGIOMA   SEBORRHEIC KERATOSIS   MULTIPLE BENIGN NEVI   ACTINIC SKIN DAMAGE   Return in about 6 months (around 02/09/2025) for TBSE follow up.  I, Doyce Pan, CMA, am acting as scribe for Kaemon Barnett K, PA-C.   Documentation: I have reviewed the above documentation for accuracy and completeness, and I agree with the above.  Allexus Ovens K, PA-C

## 2024-08-31 ENCOUNTER — Telehealth: Payer: Self-pay

## 2024-08-31 NOTE — Telephone Encounter (Addendum)
 BSX ICD ALERT:  RV LEAD IMPEDANCE    Alert remote transmission:  Right ventricular pacing lead impedance out of range. Recent variable and intermittently elevated RV impedances.  Via website, RV impedance up to 1449 ohms on 11/6.    07/30/2024 in office check was 560 ohms which is consistent with baseline. RV sensing and shock impedance trends are normal.  Unable to evaluate cap threshold since last office visit as is not programmed with trend on for remote testing.   Spoke with patient.  He has had multiple surgeries in past months and medication changes.  Denies any recent falls.  Set up in clinic appointment tomorrow with Device and industry to be present to evaluate acute fluctuations and determine re-programming needs. ? Bipolar v/s Unipolar configuration measurements, appears currently programmed bipolar.

## 2024-09-01 ENCOUNTER — Ambulatory Visit: Attending: Cardiology | Admitting: *Deleted

## 2024-09-01 ENCOUNTER — Ambulatory Visit: Payer: Medicare HMO

## 2024-09-01 DIAGNOSIS — Z95810 Presence of automatic (implantable) cardiac defibrillator: Secondary | ICD-10-CM

## 2024-09-01 DIAGNOSIS — I255 Ischemic cardiomyopathy: Secondary | ICD-10-CM

## 2024-09-01 NOTE — Progress Notes (Signed)
 Patient seen in clinic for RV impedance recheck with BSX rep present RV impedance tested multiple times and unable to reproduce elevated values seen in previous measurements even with isometric hand exercises Will continue to monitor for changes in values No other testing performed or changes to programming made No charge for visit Of note, several ATR episodes noted on previous alert transmissions from 08/28/24 and 08/31/24 in [facility] for rhythms c/w Afib and greater than 6 hrs in length Previous patient of Dr. Fernande and has been previously dx as SCAF and therefore not on any [facility] Patient stated feeling slightly more tired that usual  Patient denies any hx of stroke Will route to Kennyth, MD for review and advisement regarding Afib clinic or not Patient would prefer to be seen in [place] if possible for management

## 2024-09-01 NOTE — Patient Instructions (Signed)
 Follow up as scheduled.

## 2024-09-02 ENCOUNTER — Other Ambulatory Visit: Payer: Self-pay | Admitting: Orthopedic Surgery

## 2024-09-08 ENCOUNTER — Ambulatory Visit

## 2024-09-08 ENCOUNTER — Encounter
Admission: RE | Admit: 2024-09-08 | Discharge: 2024-09-08 | Disposition: A | Discharge status: Home / Self Care | Source: Ambulatory Visit | Attending: Orthopedic Surgery | Admitting: Orthopedic Surgery

## 2024-09-08 ENCOUNTER — Telehealth (HOSPITAL_BASED_OUTPATIENT_CLINIC_OR_DEPARTMENT_OTHER): Payer: Self-pay | Admitting: *Deleted

## 2024-09-08 ENCOUNTER — Other Ambulatory Visit: Payer: Self-pay

## 2024-09-08 VITALS — BP 115/69 | HR 60 | Resp 16 | Wt 190.5 lb

## 2024-09-08 DIAGNOSIS — M25552 Pain in left hip: Secondary | ICD-10-CM

## 2024-09-08 DIAGNOSIS — Z01812 Encounter for preprocedural laboratory examination: Secondary | ICD-10-CM | POA: Insufficient documentation

## 2024-09-08 DIAGNOSIS — I255 Ischemic cardiomyopathy: Secondary | ICD-10-CM | POA: Diagnosis not present

## 2024-09-08 DIAGNOSIS — Z01818 Encounter for other preprocedural examination: Secondary | ICD-10-CM | POA: Diagnosis present

## 2024-09-08 HISTORY — DX: Essential (primary) hypertension: I10

## 2024-09-08 HISTORY — DX: Heart failure, unspecified: I50.9

## 2024-09-08 HISTORY — DX: Acute myocardial infarction, unspecified: I21.9

## 2024-09-08 HISTORY — DX: Malignant (primary) neoplasm, unspecified: C80.1

## 2024-09-08 LAB — CBC WITH DIFFERENTIAL/PLATELET
Abs Immature Granulocytes: 0.01 K/uL (ref 0.00–0.07)
Basophils Absolute: 0 K/uL (ref 0.0–0.1)
Basophils Relative: 0 %
Eosinophils Absolute: 0.1 K/uL (ref 0.0–0.5)
Eosinophils Relative: 1 %
HCT: 42.5 % (ref 39.0–52.0)
Hemoglobin: 14 g/dL (ref 13.0–17.0)
Immature Granulocytes: 0 %
Lymphocytes Relative: 15 %
Lymphs Abs: 1.3 K/uL (ref 0.7–4.0)
MCH: 29.7 pg (ref 26.0–34.0)
MCHC: 32.9 g/dL (ref 30.0–36.0)
MCV: 90.2 fL (ref 80.0–100.0)
Monocytes Absolute: 0.6 K/uL (ref 0.1–1.0)
Monocytes Relative: 7 %
Neutro Abs: 6.7 K/uL (ref 1.7–7.7)
Neutrophils Relative %: 77 %
Platelets: 218 K/uL (ref 150–400)
RBC: 4.71 MIL/uL (ref 4.22–5.81)
RDW: 14.2 % (ref 11.5–15.5)
WBC: 8.7 K/uL (ref 4.0–10.5)
nRBC: 0 % (ref 0.0–0.2)

## 2024-09-08 LAB — COMPREHENSIVE METABOLIC PANEL WITH GFR
ALT: 34 U/L (ref 0–44)
AST: 26 U/L (ref 15–41)
Albumin: 4.3 g/dL (ref 3.5–5.0)
Alkaline Phosphatase: 84 U/L (ref 38–126)
Anion gap: 9 (ref 5–15)
BUN: 14 mg/dL (ref 8–23)
CO2: 26 mmol/L (ref 22–32)
Calcium: 9.5 mg/dL (ref 8.9–10.3)
Chloride: 103 mmol/L (ref 98–111)
Creatinine, Ser: 0.88 mg/dL (ref 0.61–1.24)
GFR, Estimated: >60 mL/min (ref >60)
Glucose, Bld: 95 mg/dL (ref 70–99)
Potassium: 4.3 mmol/L (ref 3.5–5.1)
Sodium: 137 mmol/L (ref 135–145)
Total Bilirubin: 0.7 mg/dL (ref 0.0–1.2)
Total Protein: 7.1 g/dL (ref 6.5–8.1)

## 2024-09-08 LAB — URINALYSIS, ROUTINE W REFLEX MICROSCOPIC
Bilirubin Urine: NEGATIVE
Glucose, UA: NEGATIVE mg/dL
Hgb urine dipstick: NEGATIVE
Ketones, ur: NEGATIVE mg/dL
Leukocytes,Ua: NEGATIVE
Nitrite: NEGATIVE
Protein, ur: NEGATIVE mg/dL
Specific Gravity, Urine: 1.023 (ref 1.005–1.030)
pH: 5 (ref 5.0–8.0)

## 2024-09-08 LAB — SURGICAL PCR SCREEN
MRSA, PCR: NEGATIVE
Staphylococcus aureus: NEGATIVE

## 2024-09-08 NOTE — Telephone Encounter (Signed)
   Name: [name] [name]  DOB: Nov 05, 1960  MRN: [id number]  Primary Cardiologist: Deatrice Cage, MD   Preoperative team, please contact this patient and set up a phone call appointment for further preoperative risk assessment. Please obtain consent and complete medication review. Thank you for your help.  I confirm that guidance regarding antiplatelet and oral anticoagulation therapy has been completed and, if necessary, noted below.   Pt recently seen by Suzann Riddle on 07/30/24, but she is unable to provide clearance.   Please schedule a tele visit, recommend no charge.  I also confirmed the patient resides in the state of [place] . As per [facility] Medical Board telemedicine laws, the patient must reside in the state in which the provider is licensed.   Jon Nat Hails, PA 09/08/2024, 4:27 PM [facility]

## 2024-09-08 NOTE — Telephone Encounter (Signed)
 Hi [name] [name] You saw this patient on 07/30/24. Can you please complete his preoperative risk evaluation prior to THA?

## 2024-09-08 NOTE — Patient Instructions (Addendum)
 Your procedure is scheduled on: 10/01/24 - Thursday Report to the Registration Desk on the 1st floor of the [facility]. To find out your arrival time, please call [phone] between 1PM - 3PM on: 09/30/24 - Wednesday If your arrival time is 6:00 am, do not arrive before that time as the Medical Mall entrance doors do not open until 6:00 am.  REMEMBER: Instructions that are not followed completely may result in serious medical risk, up to and including death; or upon the discretion of your surgeon and anesthesiologist your surgery may need to be rescheduled.  Do not eat food after midnight the night before surgery.  No gum chewing or hard candies.  You may however, drink CLEAR liquids up to 2 hours before you are scheduled to arrive for your surgery. Do not drink anything within 2 hours of your scheduled arrival time.  Clear liquids include: - water  - apple juice without pulp - gatorade (not RED colors) - black coffee or tea (Do NOT add milk or creamers to the coffee or tea) Do NOT drink anything that is not on this list.  In addition, your doctor has ordered for you to drink the provided:  Ensure Pre-Surgery Clear Carbohydrate Drink  Drinking this carbohydrate drink up to two hours before surgery helps to reduce insulin resistance and improve patient outcomes. Please complete drinking 2 hours before scheduled arrival time.  One week prior to surgery: Stop taking 12/04, Anti-inflammatories (NSAIDS) such as Advil, Aleve, Ibuprofen, Motrin, Naproxen, Naprosyn and Aspirin  based products such as Excedrin, Goody's Powder, BC Powder. You may take Tylenol  if needed for pain up until the day of surgery.  Stop taking 12/04, ANY OVER THE COUNTER supplements until after surgery -Omega-3 Fatty Acids    HOLD 3 days prior to surgery - empagliflozin  (JARDIANCE ) take last dose on 12/07.  Aspirin  - will need to get instructions from cardiology.  ON THE DAY OF SURGERY ONLY TAKE THESE MEDICATIONS  WITH SIPS OF WATER:  carvedilol  (COREG )  atorvastatin   ezetimibe  (ZETIA )    No Alcohol for 24 hours before or after surgery.  No Smoking including e-cigarettes for 24 hours before surgery.  No chewable tobacco products for at least 6 hours before surgery.  No nicotine patches on the day of surgery.  Do not use any recreational drugs for at least a week (preferably 2 weeks) before your surgery.  Please be advised that the combination of cocaine and anesthesia may have negative outcomes, up to and including death. If you test positive for cocaine, your surgery will be cancelled.  On the morning of surgery brush your teeth with toothpaste and water, you may rinse your mouth with mouthwash if you wish. Do not swallow any toothpaste or mouthwash.  Use CHG Soap or wipes as directed on instruction sheet.  Do not wear jewelry, make-up, hairpins, clips or nail polish.  For welded (permanent) jewelry: bracelets, anklets, waist bands, etc.  Please have this removed prior to surgery.  If it is not removed, there is a chance that hospital personnel will need to cut it off on the day of surgery.  Do not wear lotions, powders, or perfumes.   Do not shave body hair from the neck down 48 hours before surgery.  Contact lenses, hearing aids and dentures may not be worn into surgery.  Do not bring valuables to the hospital. [facility] is not responsible for any missing/lost belongings or valuables.   Notify your doctor if there is any change  in your medical condition (cold, fever, infection).  Wear comfortable clothing (specific to your surgery type) to the hospital.  After surgery, you can help prevent lung complications by doing breathing exercises.  Take deep breaths and cough every 1-2 hours. Your doctor may order a device called an Incentive Spirometer to help you take deep breaths.  If you are being admitted to the hospital overnight, leave your suitcase in the car. After surgery it may  be brought to your room.  In case of increased patient census, it may be necessary for you, the patient, to continue your postoperative care in the Same Day Surgery department.  If you are being discharged the day of surgery, you will not be allowed to drive home. You will need a responsible individual to drive you home and stay with you for 24 hours after surgery.   If you are taking public transportation, you will need to have a responsible individual with you.  Please call the Pre-admissions Testing Dept. at [phone] if you have any questions about these instructions.  Surgery Visitation Policy:  Patients having surgery or a procedure may have two visitors.  Children under the age of 23 must have an adult with them who is not the patient.  Inpatient Visitation:    Visiting hours are 7 a.m. to 8 p.m. Up to four visitors are allowed at one time in a patient room. The visitors may rotate out with other people during the day.  One visitor age 13 or older may stay with the patient overnight and must be in the room by 8 p.m.   Merchandiser, Retail to address health-related social needs:  [email or web address]    Pre-operative 4 CHG Bath Instructions   You can play a key role in reducing the risk of infection after surgery. Your skin needs to be as free of germs as possible. You can reduce the number of germs on your skin by washing with CHG (chlorhexidine  gluconate) soap before surgery. CHG is an antiseptic soap that kills germs and continues to kill germs even after washing.   DO NOT use if you have an allergy to chlorhexidine /CHG or antibacterial soaps. If your skin becomes reddened or irritated, stop using the CHG and notify one of our RNs at [phone].   Please shower with the CHG soap starting 4 days before surgery using the following schedule:12/07 - 12/10.    Please keep in mind the following:  DO NOT shave, including legs and underarms, starting  the day of your first shower.   You may shave your face at any point before/day of surgery.  Place clean sheets on your bed the day you start using CHG soap. Use a clean washcloth (not used since being washed) for each shower. DO NOT sleep with pets once you start using the CHG.   CHG Shower Instructions:  If you choose to wash your hair and private area, wash first with your normal shampoo/soap.  After you use shampoo/soap, rinse your hair and body thoroughly to remove shampoo/soap residue.  Turn the water OFF and apply about 3 tablespoons (45 ml) of CHG soap to a CLEAN washcloth.  Apply CHG soap ONLY FROM YOUR NECK DOWN TO YOUR TOES (washing for 3-5 minutes)  DO NOT use CHG soap on face, private areas, open wounds, or sores.  Pay special attention to the area where your surgery is being performed.  If you are having back surgery, having someone wash your back for you may be  helpful. Wait 2 minutes after CHG soap is applied, then you may rinse off the CHG soap.  Pat dry with a clean towel  Put on clean clothes/pajamas   If you choose to wear lotion, please use ONLY the CHG-compatible lotions on the back of this paper.     Additional instructions for the day of surgery: DO NOT APPLY any lotions, deodorants, cologne, or perfumes.   Put on clean/comfortable clothes.  Brush your teeth.  Ask your nurse before applying any prescription medications to the skin.      CHG Compatible Lotions   Aveeno Moisturizing lotion  Cetaphil Moisturizing Cream  Cetaphil Moisturizing Lotion  Clairol Herbal Essence Moisturizing Lotion, Dry Skin  Clairol Herbal Essence Moisturizing Lotion, Extra Dry Skin  Clairol Herbal Essence Moisturizing Lotion, Normal Skin  Curel Age Defying Therapeutic Moisturizing Lotion with Alpha Hydroxy  Curel Extreme Care Body Lotion  Curel Soothing Hands Moisturizing Hand Lotion  Curel Therapeutic Moisturizing Cream, Fragrance-Free  Curel Therapeutic Moisturizing Lotion,  Fragrance-Free  Curel Therapeutic Moisturizing Lotion, Original Formula  Eucerin Daily Replenishing Lotion  Eucerin Dry Skin Therapy Plus Alpha Hydroxy Crme  Eucerin Dry Skin Therapy Plus Alpha Hydroxy Lotion  Eucerin Original Crme  Eucerin Original Lotion  Eucerin Plus Crme Eucerin Plus Lotion  Eucerin TriLipid Replenishing Lotion  Keri Anti-Bacterial Hand Lotion  Keri Deep Conditioning Original Lotion Dry Skin Formula Softly Scented  Keri Deep Conditioning Original Lotion, Fragrance Free Sensitive Skin Formula  Keri Lotion Fast Absorbing Fragrance Free Sensitive Skin Formula  Keri Lotion Fast Absorbing Softly Scented Dry Skin Formula  Keri Original Lotion  Keri Skin Renewal Lotion Keri Silky Smooth Lotion  Keri Silky Smooth Sensitive Skin Lotion  Nivea Body Creamy Conditioning Oil  Nivea Body Extra Enriched Lotion  Nivea Body Original Lotion  Nivea Body Sheer Moisturizing Lotion Nivea Crme  Nivea Skin Firming Lotion  NutraDerm 30 Skin Lotion  NutraDerm Skin Lotion  NutraDerm Therapeutic Skin Cream  NutraDerm Therapeutic Skin Lotion  ProShield Protective Hand Cream  Provon moisturizing lotion  How to Use an Incentive Spirometer  An incentive spirometer is a tool that measures how well you are filling your lungs with each breath. Learning to take long, deep breaths using this tool can help you keep your lungs clear and active. This may help to reverse or lessen your chance of developing breathing (pulmonary) problems, especially infection. You may be asked to use a spirometer: After a surgery. If you have a lung problem or a history of smoking. After a long period of time when you have been unable to move or be active. If the spirometer includes an indicator to show the highest number that you have reached, your health care provider or respiratory therapist will help you set a goal. Keep a log of your progress as told by your health care provider. What are the  risks? Breathing too quickly may cause dizziness or cause you to pass out. Take your time so you do not get dizzy or light-headed. If you are in pain, you may need to take pain medicine before doing incentive spirometry. It is harder to take a deep breath if you are having pain. How to use your incentive spirometer  Sit up on the edge of your bed or on a chair. Hold the incentive spirometer so that it is in an upright position. Before you use the spirometer, breathe out normally. Place the mouthpiece in your mouth. Make sure your lips are closed tightly around it. Breathe in  slowly and as deeply as you can through your mouth, causing the piston or the ball to rise toward the top of the chamber. Hold your breath for 3-5 seconds, or for as long as possible. If the spirometer includes a coach indicator, use this to guide you in breathing. Slow down your breathing if the indicator goes above the marked areas. Remove the mouthpiece from your mouth and breathe out normally. The piston or ball will return to the bottom of the chamber. Rest for a few seconds, then repeat the steps 10 or more times. Take your time and take a few normal breaths between deep breaths so that you do not get dizzy or light-headed. Do this every 1-2 hours when you are awake. If the spirometer includes a goal marker to show the highest number you have reached (best effort), use this as a goal to work toward during each repetition. After each set of 10 deep breaths, cough a few times. This will help to make sure that your lungs are clear. If you have an incision on your chest or abdomen from surgery, place a pillow or a rolled-up towel firmly against the incision when you cough. This can help to reduce pain while taking deep breaths and coughing. General tips When you are able to get out of bed: Walk around often. Continue to take deep breaths and cough in order to clear your lungs. Keep using the incentive spirometer until  your health care provider says it is okay to stop using it. If you have been in the hospital, you may be told to keep using the spirometer at home. Contact a health care provider if: You are having difficulty using the spirometer. You have trouble using the spirometer as often as instructed. Your pain medicine is not giving enough relief for you to use the spirometer as told. You have a fever. Get help right away if: You develop shortness of breath. You develop a cough with bloody mucus from the lungs. You have fluid or blood coming from an incision site after you cough. Summary An incentive spirometer is a tool that can help you learn to take long, deep breaths to keep your lungs clear and active. You may be asked to use a spirometer after a surgery, if you have a lung problem or a history of smoking, or if you have been inactive for a long period of time. Use your incentive spirometer as instructed every 1-2 hours while you are awake. If you have an incision on your chest or abdomen, place a pillow or a rolled-up towel firmly against your incision when you cough. This will help to reduce pain. Get help right away if you have shortness of breath, you cough up bloody mucus, or blood comes from your incision when you cough. This information is not intended to replace advice given to you by your health care provider. Make sure you discuss any questions you have with your health care provider. Document Revised: 12/28/2019 Document Reviewed: 12/28/2019 Elsevier Patient Education  2023 [employer].

## 2024-09-08 NOTE — Telephone Encounter (Signed)
   Pre-operative Risk Assessment    Patient Name: [name] [name]  DOB: July 03, 1961 MRN: [id number]   Date of last office visit: 07/30/24 CHANTAL NEEDLE, NP Date of next office visit: NONE   Request for Surgical Clearance    Procedure:  ARTHROPLASTY, HIP, TOTAL, ANTERIOR APPROACH (Left: Hip)  Date of Surgery:  Clearance 10/01/24                                Surgeon:  DR. ARTHEA SHEER Surgeon's Group or Practice Name: [facility]  Phone number:  [phone] Fax number:  Return FAX not required. I will follow up in [facility].    Type of Clearance Requested:   - Medical ; PER NOTES PT CAN STAY ON ASA PERIOPERATIVE; WILL HOLD ASA THE DAY OF SURGERY   Type of Anesthesia:  General    Additional requests/questions:    Signed, Kolton Kienle   09/08/2024, 1:27 PM

## 2024-09-08 NOTE — Telephone Encounter (Signed)
Left message to call back and schedule tele pre op appt

## 2024-09-08 NOTE — Telephone Encounter (Signed)
-----   Message from [name] [name] sent at 09/08/2024 12:22 PM EST ----- Regarding: Request for pre-operative cardiac clearance Request for pre-operative cardiac clearance:  1. What type of surgery is being performed?  ARTHROPLASTY, HIP, TOTAL, ANTERIOR APPROACH (Left: Hip)  2. When is this surgery scheduled?  10/01/2024  3. Type of clearance being requested (medical, pharmacy, both)? MEDICAL   4. Are there any medications that need to be held prior to surgery? N/A - surgeon has cleared patient to continue their daily LOW DOSE ASPIRIN  throughout the perioperative course. Dose will be held on the day of surgery only.   5. Practice name and name of physician performing surgery?  Performing surgeon: Dr. Arthea Sheer, MD Requesting clearance: [name] Pereyra, FNP-C    6. Anesthesia type (none, local, MAC, general)? GENERAL  7. What is the office phone and fax number?   Phone: [phone] Fax: Return FAX not required. I will follow up in [facility].  ATTENTION: Unable to create telephone message as per your standard workflow. Directed by [facility] providers to send requests for cardiac clearance to this pool for appropriate distribution to provider covering pre-operative clearances.   [name] Pereyra, MSN, APRN, FNP-C, CEN [facility]  Peri-operative Services Nurse Practitioner Phone: [phone] 09/08/24 12:22 PM

## 2024-09-09 ENCOUNTER — Telehealth (HOSPITAL_BASED_OUTPATIENT_CLINIC_OR_DEPARTMENT_OTHER): Payer: Self-pay | Admitting: *Deleted

## 2024-09-09 LAB — CUP PACEART REMOTE DEVICE CHECK
Battery Remaining Longevity: 108 mo
Battery Remaining Percentage: 100 %
Brady Statistic RA Percent Paced: 0 %
Brady Statistic RV Percent Paced: 0 %
Date Time Interrogation Session: 20251118002000
HighPow Impedance: 50 Ohm
Implantable Lead Connection Status: 753985
Implantable Lead Connection Status: 753985
Implantable Lead Implant Date: 20101104
Implantable Lead Implant Date: 20101104
Implantable Lead Location: 753859
Implantable Lead Location: 753860
Implantable Lead Model: 158
Implantable Lead Model: 5076
Implantable Lead Serial Number: 301087
Implantable Pulse Generator Implant Date: 20210215
Lead Channel Impedance Value: 499 Ohm
Lead Channel Impedance Value: 522 Ohm
Lead Channel Setting Pacing Amplitude: 2 V
Lead Channel Setting Pacing Amplitude: 2.5 V
Lead Channel Setting Pacing Pulse Width: 0.4 ms
Lead Channel Setting Sensing Sensitivity: 0.5 mV
Pulse Gen Serial Number: 218699

## 2024-09-09 NOTE — Telephone Encounter (Signed)
 Pt has been scheduled tele preop appt 09/22/24. Med rec and consent are done.      Patient Consent for Virtual Visit        [name] [name] has provided verbal consent on 09/09/2024 for a virtual visit (video or telephone).   CONSENT FOR VIRTUAL VISIT FOR:  [name] [name]  By participating in this virtual visit I agree to the following:  I hereby voluntarily request, consent and authorize [facility] and its employed or contracted physicians, physician assistants, nurse practitioners or other licensed health care professionals (the Practitioner), to provide me with telemedicine health care services (the "Services) as deemed necessary by the treating Practitioner. I acknowledge and consent to receive the Services by the Practitioner via telemedicine. I understand that the telemedicine visit will involve communicating with the Practitioner through live audiovisual communication technology and the disclosure of certain medical information by electronic transmission. I acknowledge that I have been given the opportunity to request an in-person assessment or other available alternative prior to the telemedicine visit and am voluntarily participating in the telemedicine visit.  I understand that I have the right to withhold or withdraw my consent to the use of telemedicine in the course of my care at any time, without affecting my right to future care or treatment, and that the Practitioner or I may terminate the telemedicine visit at any time. I understand that I have the right to inspect all information obtained and/or recorded in the course of the telemedicine visit and may receive copies of available information for a reasonable fee.  I understand that some of the potential risks of receiving the Services via telemedicine include:  Delay or interruption in medical evaluation due to technological equipment failure or disruption; Information transmitted may not be sufficient (e.g. poor  resolution of images) to allow for appropriate medical decision making by the Practitioner; and/or  In rare instances, security protocols could fail, causing a breach of personal health information.  Furthermore, I acknowledge that it is my responsibility to provide information about my medical history, conditions and care that is complete and accurate to the best of my ability. I acknowledge that Practitioner's advice, recommendations, and/or decision may be based on factors not within their control, such as incomplete or inaccurate data provided by me or distortions of diagnostic images or specimens that may result from electronic transmissions. I understand that the practice of medicine is not an exact science and that Practitioner makes no warranties or guarantees regarding treatment outcomes. I acknowledge that a copy of this consent can be made available to me via my patient portal [facility] MyChart), or I can request a printed copy by calling the office of [facility].    I understand that my insurance will be billed for this visit.   I have read or had this consent read to me. I understand the contents of this consent, which adequately explains the benefits and risks of the Services being provided via telemedicine.  I have been provided ample opportunity to ask questions regarding this consent and the Services and have had my questions answered to my satisfaction. I give my informed consent for the services to be provided through the use of telemedicine in my medical care

## 2024-09-09 NOTE — Telephone Encounter (Signed)
 Patient returned Pre-op call.

## 2024-09-09 NOTE — Telephone Encounter (Signed)
 Pt has been scheduled tele pre op appt 11/13/23. Med rec and consent are done.

## 2024-09-10 ENCOUNTER — Encounter: Payer: Self-pay | Admitting: Cardiology

## 2024-09-10 ENCOUNTER — Encounter: Payer: Self-pay | Admitting: Orthopedic Surgery

## 2024-09-10 NOTE — Progress Notes (Signed)
 Remote ICD Transmission

## 2024-09-10 NOTE — Progress Notes (Signed)
 PERIOPERATIVE PRESCRIPTION FOR IMPLANTED CARDIAC DEVICE PROGRAMMING  Patient Information: Name:  [name] [name]  DOB:  October 09, 1961  MRN:  [id number]  Planned Procedure: ARTHROPLASTY, HIP, TOTAL, ANTERIOR APPROACH (Left: Hip)   Surgeon:  Dr. Arthea Sheer, MD  Requesting device clearance: Dorise Pereyra, FNP-C  Date of Procedure:  09/21/2024  Cautery will be used.  Device Information:  Clinic EP Physician: Fonda Kitty, MD  Device Type:  Defibrillator Manufacturer and Phone #:  [employer]: [phone] Pacemaker Dependent?:  No. Date of Last Device Check:  09/08/24 Normal Device Function?:  Yes.    Electrophysiologist's Recommendations:  Have magnet available. Provide continuous ECG monitoring when magnet is used or reprogramming is to be performed.  Procedure may interfere with device function.  Magnet should be placed over device during procedure.  Per Device Clinic Standing Orders, [name] JINNY Silvan, RN  10:53 AM 09/10/2024

## 2024-09-10 NOTE — Progress Notes (Signed)
 Perioperative / Anesthesia Services  Pre-Admission Testing Clinical Review / Pre-Operative Anesthesia Consult  Date: 09/22/24  PATIENT DEMOGRAPHICS: Name: [name] [name] DOB: 10-10-1961 MRN:   [id number]  Note: Available PAT nursing documentation and vital signs have been reviewed. Clinical nursing staff has updated patient's PMH/PSHx, current medication list, and drug allergies/intolerances to ensure complete and comprehensive history available to assist care teams in MDM as it pertains to the aforementioned surgical procedure and anticipated anesthetic course. Extensive review of available clinical information personally performed. Nursing documentation reviewed. [place] PMH and PSHx updated with any diagnoses and/or procedures that I have knowledge of that may have been inadvertently omitted during his intake with the pre-admission testing department's nursing staff.  PLANNED SURGICAL PROCEDURE(S):   Case: [id number] Date/Time: 10/01/24 0715   Procedure: ARTHROPLASTY, HIP, TOTAL, ANTERIOR APPROACH (Left: Hip)   Anesthesia type: Choice   Diagnosis: Primary osteoarthritis of left hip [M16.12]   Pre-op diagnosis: Primary osteoarthritis of left hip M16.12   Location: [facility] OR ROOM 01 / [facility]S FOR ANEST[facility]   Surgeons: Lorelle Hussar, MD        CLINICAL DISCUSSION: [name] [name] is a 63 y.o. male who is submitted for pre-surgical anesthesia review and clearance prior to him undergoing the above procedure. Patient is a Former Smoker (37 pack years; quit 04/2009). Pertinent PMH includes: CAD (s/p CABG), MI, postoperative atrial fibrillation, ischemic cardiomyopathy with resulting HFrEF (s/p AICD placement), mitral valve disease (s/p MVR), apical mural thrombus, BILATERAL carotid artery stenosis, cardiac murmur, HLD, GERD (no daily Tx), OA, neuropathy.   Patient is followed by cardiology Marsa, MD) and electrophysiology Robyne, MD).  He was last seen in the cardiology clinic on  07/30/2024; notes reviewed. At the time of his clinic visit, patient doing well overall from a cardiovascular perspective. Patient denied any chest pain, shortness of breath, PND, orthopnea, palpitations, significant peripheral edema, weakness, fatigue, vertiginous symptoms, or presyncope/syncope. Patient with a past medical history significant for cardiovascular diagnoses. Documented physical exam was grossly benign, providing no evidence of acute exacerbation and/or decompensation of the patient's known cardiovascular conditions.  Of note, complete records regarding patient's cardiovascular history unavailable for review at time of consult.  Information gathered from patient report and from notes provided by his local cardiologist.  Patient reported to have suffered an MI (unknown type) in the past.  ECG performed in 04/2009 demonstrated anterior and inferior Q waves suggestive of old MI. Again, patient unable to advise when the cardiac event occurred, however he thinks that it happened before 2010.  Patient with a longstanding history of an ischemic cardiomyopathy with resulting HFrEF.  EF as low as 20%.  Patient enrolled in the MADIT-RIT trial.  He underwent placement of a [employer] Teligen 100 AICD on 08/25/2009.  Due to device reaching ERI/EOS, pulse generator was changed on 12/17/2019, at which time a [employer] EL AICD (SN: [id number]) was placed.  Device regularly interrogated by patient's primary electrophysiology team.  Device most recently interrogated on 09/08/2024  at which time device was noted to be functioning properly.  Cardiology notes that patient is not pacemaker dependent.   Patient underwent diagnostic LEFT heart catheterization on 05/02/2009.  Study revealed multivessel CAD with complete occlusions of the proximal RCA, ostial LAD, and LCx.  Given the degree and complexity of patient's coronary artery disease, patient was referred to CVTS for consideration of  revascularization.   Patient underwent two-vessel revascularization on 05/05/2009.  LIMA-LAD and RIMA-RCA bypass grafts were placed.  Additionally, patient with no significant mitral valve disease.  He underwent concurrent mitral valve replacement placing a 28-mm Sorin 3D MEMO annuloplasty ring.  Procedure was complicated by the development of postoperative atrial fibrillation.  Atrial arrhythmia was treated with antiarrhythmic therapy (amiodarone).  Additionally, patient developed and apical mural thrombus, which was treated with warfarin therapy.   Patient with a history of known carotid artery disease.  Most recent carotid Doppler study performed on 08/07/2022 revealed a 1-39% stenosis of the patient's BILATERAL carotid arteries.  Vertebral arteries demonstrated antegrade flow.  There were normal flow hemodynamics seen in the subclavian arteries.   Most recent TTE performed on 01/23/2023 revealed a moderately reduced left ventricular systolic function with an EF of 30-35%.  Left ventricle demonstrated global hypokinesis with akinesis in the apical segments.  Left ventricular diastolic Doppler parameters were indeterminate. Right ventricular size and function normal with a TAPSE measuring 1.9 cm  (normal range >/= 1.6 cm).  Ring annuloplasty ring noted to the mitral valve.  There was mild mitral and mild to moderate aortic valve regurgitation.  All transvalvular gradients were noted to be normal providing no evidence of hemodynamically significant valvular stenosis. Aorta normal in size with no evidence of ectasia or aneurysmal dilatation.  Ischemic cardiomyopathy and resulting HFrEF being managed on GDMT interventions including beta-blocker (carvedilol ), MRA (spironolactone ), and ARB/ARNI (Entresto ), and SGLT2i (empagliflozin ) therapies.  Blood pressure documented at 110/62 mmHg on the aforementioned regimen.  Patient is on atorvastatin  + ezetimibe  for his HLD diagnosis and ASCVD prevention.  Patient has  a supply of short acting nitrates (NTG) to use on a as needed basis for recurrent angina/anginal equivalent symptoms; denied recent use.  Patient is not diabetic.  He does not have an OSAH diagnosis.  Functional capacity limited by orthopedic pain, however with that said, patient is able to complete all of his ADLs/IADLs without cardiovascular limitation.  Per the DASI, patient is able to exceed 4 METS of physical activity without experiencing any significant degrees of angina/anginal equivalent symptoms. No changes were made to his medication regimen during his visit with cardiology.  Patient scheduled to follow-up with outpatient cardiology in 12 months or sooner if needed.   [name] [name] is scheduled for an elective ARTHROPLASTY, HIP, TOTAL, ANTERIOR APPROACH (Left: Hip) on 09/21/2024 with Dr. Arthea Sheer, MD. Given patient's past medical history significant for cardiovascular diagnoses, presurgical cardiac clearance was sought by the PAT team. Per cardiology, according to the Revised Cardiac Risk Index (RCRI), his Perioperative Risk of Major Cardiac Event is (%): 6.6. His Functional Capacity in METs is: 6.36 according to the [place] Activity Status Index (DASI).Therefore, based on ACC/AHA guidelines, patient would be at ACCEPTABLE risk for the planned procedure without further cardiovascular testing.  In review of the patient's medication reconciliation, it is noted that he is on daily oral antithrombotic therapy. Given that patient's past medical history is significant for cardiovascular diagnoses, including but not limited to CAD, orthopedics has cleared patient to continue his daily low dose ASA throughout his perioperative course.  Patient has been updated on these directives from his specialty care providers by the PAT team.  Patient denies previous perioperative complications with anesthesia in the past. In review his EMR, it is noted that patient underwent a general anesthetic course here at  [facility] ([facility]) in 05/2024 without documented complications.   MOST RECENT VITAL SIGNS:    09/08/2024   11:14 AM 08/11/2024    9:30 AM 07/30/2024  10:07 AM  Vitals with BMI  Height   5' 10  Weight 190 lbs 8 oz  183 lbs  BMI   26.26  Systolic 115 135 889  Diastolic 69 69 62  Pulse 60 58 69   PROVIDERS/SPECIALISTS: NOTE: Primary physician provider listed below. Patient may have been seen by APP or partner within same practice.   PROVIDER ROLE / SPECIALTY LAST [name] Lorelle Hussar, MD Orthopedics (Surgeon) 07/31/2024  Gareth Mliss FALCON, FNP Primary Care Provider 03/11/2024  Darron Grass, MD Cardiology 04/22/2024; preop APP call 09/22/2024  Fernande Senior, MD Electrophysiology 07/30/2024   ALLERGIES: No Known Allergies  CURRENT HOME MEDICATIONS: No current facility-administered medications for this encounter.    aspirin  EC 81 MG tablet   atorvastatin  (LIPITOR ) 80 MG tablet   carvedilol  (COREG ) 25 MG tablet   empagliflozin  (JARDIANCE ) 10 MG TABS tablet   ezetimibe  (ZETIA ) 10 MG tablet   nitroGLYCERIN  (NITROSTAT ) 0.4 MG SL tablet   Omega-3 Fatty Acids (FISH OIL PO)   sacubitril -valsartan  (ENTRESTO ) 24-26 MG   spironolactone  (ALDACTONE ) 25 MG tablet   HISTORY: Past Medical History:  Diagnosis Date   AICD (automatic cardioverter/defibrillator) present 08/25/2009   a.) enrolled in MADIT-RIT trial; s/p [employer] 100 AICD (SN: [id number]) placement 08/25/2009; b.) pulse generator changed 12/17/2019 --> [employer] EL AICD (SN: [id number]) placed   Apical mural thrombus    a.) Tx'd to resolution with warfarin   B12 deficiency    Bilateral carotid artery stenosis    Coronary artery disease    a.) LHC 05/02/2009: CTO pRCA, oLAD, LCx --> 2v revascularization 05/05/2009: LIMA-LAD, RIMA-RCA   Former smoker    GERD (gastroesophageal reflux disease)    Heart murmur    HFrEF (heart failure with reduced ejection  fraction) (HCC)    History of tobacco use    Hyperlipidemia    Hypertension    Ischemic cardiomyopathy    a.) EF as low as 20%; s/p AICD placement 08/25/2009   Long term (current) use of aspirin     Malignant melanoma in situ (HCC)    Mitral valve disease    a.) s/p MVR (28-mm Sorin 3D MEMO annuloplasty ring); performed concurrently with cardiac revascularization 05/05/2009   Myocardial infarction [facility])    a.) details unclear; pat thinks it was prior to 2010.   Neuropathy    Osteoarthritis    Osteoarthritis, hip, bilateral    Postoperative atrial fibrillation (HCC)    a.) s/p cardiac revascularization 05/05/2009 --> Tx'd with amiodarone   S/P CABG x 2 05/05/2009   a.) LIMA-LAD, RIMA-RCA   S/P MVR (mitral valve repair) 05/05/2009   a.) 28-mm Sorin 3D MEMO annuloplasty ring   Past Surgical History:  Procedure Laterality Date   CARDIAC DEFIBRILLATOR PLACEMENT N/A 08/25/2009   Procedure: CARDIAC DEFIBRILLATOR PLACEMENT   COLONOSCOPY WITH PROPOFOL  N/A 07/31/2016   Procedure: COLONOSCOPY WITH PROPOFOL ;  Surgeon: Gladis RAYMOND Mariner, MD;  Location: [facility] ENDOSCOPY;  Service: Endoscopy;  Laterality: N/A;   CORONARY ARTERY BYPASS GRAFT N/A 05/05/2009   Procedure: CORONARY ARTERY BYPASS GRAFT; Location: [facility]; Surgon: Dorise Fellers, MD   ICD GENERATOR CHANGEOUT N/A 12/07/2019   Procedure: ICD GENERATOR CHANGEOUT;  Surgeon: Fernande Elspeth BROCKS, MD;  Location: [facility] INVASIVE CV LAB;  Service: Cardiovascular;  Laterality: N/A;   KNEE SURGERY Bilateral    x4   LEFT HEART CATH AND CORONARY ANGIOGRAPHY Left 05/02/2009   Procedure: LEFT HEART CATH AND CORONARY ANGIOGRAPHY; Location: [place]   MITRAL VALVE REPAIR N/A  05/05/2009   Procedure: MITRAL VALVE REPAIR; Location: [facility]; Surgon: Dorise Fellers, MD   SHOULDER ARTHROSCOPY Bilateral    TOTAL HIP ARTHROPLASTY Right 06/18/2024   Procedure: ARTHROPLASTY, HIP, TOTAL, ANTERIOR APPROACH;  Surgeon: Lorelle Hussar, MD;  Location: [facility] ORS;   Service: Orthopedics;  Laterality: Right;   Family History  Problem Relation Age of Onset   Heart attack Father 16   Heart failure Mother    Heart attack Mother    Hyperlipidemia Other    Social History   Tobacco Use   Smoking status: Former    Current packs/day: 0.00    Average packs/day: 1 pack/day for 37.0 years (37.0 ttl pk-yrs)    Types: Cigarettes    Start date: 05/02/1972    Quit date: 05/02/2009    Years since quitting: 15.4   Smokeless tobacco: Never  Substance Use Topics   Alcohol use: No   LABS:  Hospital Outpatient Visit on 09/08/2024  Component Date Value Ref Range Status   MRSA, PCR 09/08/2024 NEGATIVE  NEGATIVE Final   Staphylococcus aureus 09/08/2024 NEGATIVE  NEGATIVE Final   Comment: (NOTE) The Xpert SA Assay (FDA approved for NASAL specimens in patients 31 years of age and older), is one component of a comprehensive surveillance program. It is not intended to diagnose infection nor to guide or monitor treatment. Performed at [facility], [address] [place], [place] [address]    WBC 09/08/2024 8.7  4.0 - 10.5 K/uL Final   RBC 09/08/2024 4.71  4.22 - 5.81 MIL/uL Final   Hemoglobin 09/08/2024 14.0  13.0 - 17.0 g/dL Final   HCT 88/81/7974 42.5  39.0 - 52.0 % Final   MCV 09/08/2024 90.2  80.0 - 100.0 fL Final   MCH 09/08/2024 29.7  26.0 - 34.0 pg Final   MCHC 09/08/2024 32.9  30.0 - 36.0 g/dL Final   RDW 88/81/7974 14.2  11.5 - 15.5 % Final   Platelets 09/08/2024 218  150 - 400 K/uL Final   nRBC 09/08/2024 0.0  0.0 - 0.2 % Final   Neutrophils Relative % 09/08/2024 77  % Final   Neutro Abs 09/08/2024 6.7  1.7 - 7.7 K/uL Final   Lymphocytes Relative 09/08/2024 15  % Final   Lymphs Abs 09/08/2024 1.3  0.7 - 4.0 K/uL Final   Monocytes Relative 09/08/2024 7  % Final   Monocytes Absolute 09/08/2024 0.6  0.1 - 1.0 K/uL Final   Eosinophils Relative 09/08/2024 1  % Final   Eosinophils Absolute 09/08/2024 0.1  0.0 - 0.5 K/uL Final   Basophils  Relative 09/08/2024 0  % Final   Basophils Absolute 09/08/2024 0.0  0.0 - 0.1 K/uL Final   Immature Granulocytes 09/08/2024 0  % Final   Abs Immature Granulocytes 09/08/2024 0.01  0.00 - 0.07 K/uL Final   Performed at [facility], [address] [place], [place] [address]   Sodium 09/08/2024 137  135 - 145 mmol/L Final   Potassium 09/08/2024 4.3  3.5 - 5.1 mmol/L Final   Chloride 09/08/2024 103  98 - 111 mmol/L Final   CO2 09/08/2024 26  22 - 32 mmol/L Final   Glucose, Bld 09/08/2024 95  70 - 99 mg/dL Final   Glucose reference range applies only to samples taken after fasting for at least 8 hours.   BUN 09/08/2024 14  8 - 23 mg/dL Final   Creatinine, Ser 09/08/2024 0.88  0.61 - 1.24 mg/dL Final   Calcium  09/08/2024 9.5  8.9 -  10.3 mg/dL Final   Total Protein 88/81/7974 7.1  6.5 - 8.1 g/dL Final   Albumin 88/81/7974 4.3  3.5 - 5.0 g/dL Final   AST 88/81/7974 26  15 - 41 U/L Final   ALT 09/08/2024 34  0 - 44 U/L Final   Alkaline Phosphatase 09/08/2024 84  38 - 126 U/L Final   Total Bilirubin 09/08/2024 0.7  0.0 - 1.2 mg/dL Final   GFR, Estimated 09/08/2024 >60  >60 mL/min Final   Comment: (NOTE) Calculated using the CKD-EPI Creatinine Equation (2021)    Anion gap 09/08/2024 9  5 - 15 Final   Performed at [facility], [address] [place], [place] [address]   Color, Urine 09/08/2024 YELLOW (A)  YELLOW Final   APPearance 09/08/2024 CLEAR (A)  CLEAR Final   Specific Gravity, Urine 09/08/2024 1.023  1.005 - 1.030 Final   pH 09/08/2024 5.0  5.0 - 8.0 Final   Glucose, UA 09/08/2024 NEGATIVE  NEGATIVE mg/dL Final   Hgb urine dipstick 09/08/2024 NEGATIVE  NEGATIVE Final   Bilirubin Urine 09/08/2024 NEGATIVE  NEGATIVE Final   Ketones, ur 09/08/2024 NEGATIVE  NEGATIVE mg/dL Final   Protein, ur 88/81/7974 NEGATIVE  NEGATIVE mg/dL Final   Nitrite 88/81/7974 NEGATIVE  NEGATIVE Final   Leukocytes,Ua 09/08/2024 NEGATIVE  NEGATIVE Final   Performed at [facility], [address] [place], [place] [address]    ECG: Date: 07/30/2024  Time ECG obtained: 1013 AM Rate: 69 bpm Rhythm: Sinus rhythm with PACs Axis (leads I and aVF): normal Intervals: PR 152 ms. QRS 106 ms. QTc 443 ms. ST segment and T wave changes: No evidence of acute T wave abnormalities or significant ST segment elevation or depression.  Evidence of a possible, age undetermined, prior infarct:  No Comparison: Similar to previous tracing obtained on 04/23/2019   IMAGING / PROCEDURES: X-RAY HIP RIGHT 2 OR 3 VIEWS WITH OR WITHOUT PELVIS performed on 07/31/2024 Status post right total hip arthroplasty with components in appropriate position.   The left hip shows severe degenerative changes with lateralization of the femoral head  with bone-on-bone articulation osteophyte formation sclerosis and cystic changes.   No evidence of periprosthetic loosening or fracture.   CT HIP RIGHT WO CONTRAST performed on 04/09/2024 Markedly severe osteoarthritis of the right hip with flattening and volume loss in the right femoral head, extensive subcortical sclerosis and degenerative subcortical cyst formation, and marked acetabular and femoral head spurring. Lateral positioning of the femoral head with respect to the acetabulum probably reflects pre-existing dysplasia. A scout image demonstrates similar lateral uncovering and severe osteoarthritis of the contralateral (left) hip although not quite as severe as the right. Suspected small right hip joint effusion. Right iliopsoas bursitis. Right iliac and common femoral artery atheromatous vascular calcification.   TRANSTHORACIC ECHOCARDIOGRAM performed on 01/23/2023 Left ventricular ejection fraction, by estimation, is 30 to 35%. The left ventricle has moderately decreased function. The left ventricle  demonstrates global hypokinesis. There is akinesis of the left ventricular, apical segment.  Right ventricular systolic function is normal.  Mild  mitral valve regurgitation.  Aortic valve regurgitation is mild to moderate.    VAS US  CAROTID performed on 08/07/2022 Velocities in the right ICA are consistent with a 1-39% stenosis. Non-hemodynamically significant plaque <50% noted in the CCA. The ECA appears <50% stenosed.  Velocities in the left ICA are consistent with a 1-39% stenosis. Non-hemodynamically significant plaque <50% noted in the CCA. The ECA appears <50% stenosed.  Bilateral vertebral arteries  demonstrate antegrade flow.  Normal flow hemodynamics were seen in bilateral subclavian arteries.   IMPRESSION AND PLAN: [name] [name] has been referred for pre-anesthesia review and clearance prior to him undergoing the planned anesthetic and procedural courses. Available labs, pertinent testing, and imaging results were personally reviewed by me in preparation for upcoming operative/procedural course. [facility] medical record has been updated following extensive record review and patient interview with PAT staff.   This patient has been appropriately cleared by cardiology with an overall ACCEPTABLE risk of patient experiencing significant perioperative cardiovascular complications. here at [facility]. Electrophysiology indicating that procedure may interfere with planned surgical procedure and are therefore recommending magnet placement over the device during the procedure.  Magnet should be removed post procedurally, which in turn will allow for the device to revert to previously programmed settings without the need for reprogramming.  Beyond normal perioperative cardiovascular monitoring and the aforementioned magnet placement, there are no recommendations from his electrophysiology team that would prompt further discussions/recommendations from an industry representative.   Based on clinical review performed today (09/22/24), barring any significant acute changes in the patient's overall condition, it  is anticipated that he will be able to proceed with the planned surgical intervention. Any acute changes in clinical condition may necessitate his procedure being postponed and/or cancelled. Patient will meet with anesthesia team (MD and/or CRNA) on the day of his procedure for preoperative evaluation/assessment. Questions regarding anesthetic course will be fielded at that time.   Pre-surgical instructions were reviewed with the patient during his PAT appointment, and questions were fielded to satisfaction by PAT clinical staff. He has been instructed on which medications that he will need to hold prior to surgery, as well as the ones that have been deemed safe/appropriate to take on the day of his procedure. As part of the general education provided by PAT, patient made aware both verbally and in writing, that he would need to abstain from the use of any illegal substances during his perioperative course. He was advised that failure to follow the provided instructions could necessitate case cancellation or result in serious perioperative complications up to and including death. Patient encouraged to contact PAT and/or his surgeon's office to discuss any questions or concerns that may arise prior to surgery; verbalized understanding.   Dorise Pereyra, MSN, APRN, FNP-C, CEN [facility]  Perioperative Services Nurse Practitioner Phone: [phone] Fax: [phone] 09/22/24 2:09 PM  NOTE: This note has been prepared using Dragon dictation software. Despite my best ability to proofread, there is always the potential that unintentional transcriptional errors may still occur from this process.

## 2024-09-13 ENCOUNTER — Ambulatory Visit: Payer: Self-pay | Admitting: Cardiology

## 2024-09-14 ENCOUNTER — Ambulatory Visit: Admitting: Nurse Practitioner

## 2024-09-22 ENCOUNTER — Ambulatory Visit: Attending: Cardiology | Admitting: Nurse Practitioner

## 2024-09-22 DIAGNOSIS — Z01810 Encounter for preprocedural cardiovascular examination: Secondary | ICD-10-CM

## 2024-09-22 NOTE — Progress Notes (Signed)
 Virtual Visit via Telephone Note   Because of [name] [name] co-morbid illnesses, he is at least at moderate risk for complications without adequate follow up.  This format is felt to be most appropriate for this patient at this time.  Due to technical limitations with video connection (technology), today's appointment will be conducted as an audio only telehealth visit, and [name] [name] verbally agreed to proceed in this manner.   All issues noted in this document were discussed and addressed.  No physical exam could be performed with this format.  Evaluation Performed:  Preoperative cardiovascular risk assessment _____________   Date:  09/22/2024   Patient ID:  [name] [name], DOB [birth year], MRN [id number] Patient Location:  Home Provider location:   Office  Primary Care Provider:  Gareth Mliss FALCON, FNP Primary Cardiologist:  Deatrice Cage, MD  Chief Complaint / Patient Profile   63 y.o. y/o male with a h/o CAD s/p CABG x 2 in 2010, mitral valve regurgitation s/p MVR, HFrEF, ICM s/p ICD, prior apical mural thrombus, bilateral carotid artery stenosis, hypertension, hyperlipidemia, and tobacco use who is pending left total hip arthroplasty (anterior approach) on 10/01/2024 with Dr. Arthea Sheer of  [facility] and presents today for telephonic preoperative cardiovascular risk assessment.  History of Present Illness    [name] [name] is a 63 y.o. male who presents via audio/video conferencing for a telehealth visit today.  Pt was last seen in cardiology clinic on 07/30/2024 by Suzann Riddle, NP.  At that time DELAN KSIAZEK was doing well.  The patient is now pending procedure as outlined above. Since his last visit, he has done well from a cardiac standpoint.   He denies chest pain, palpitations, dyspnea, pnd, orthopnea, n, v, dizziness, syncope, edema, weight gain, or early satiety. All other systems reviewed and are otherwise negative except as noted  above.   Past Medical History    Past Medical History:  Diagnosis Date   AICD (automatic cardioverter/defibrillator) present 08/25/2009   a.) enrolled in MADIT-RIT trial; s/p [employer] 100 AICD (SN: [id number]) placement 08/25/2009; b.) pulse generator changed 12/17/2019 --> [employer] EL AICD (SN: [id number]) placed   Apical mural thrombus    a.) Tx'd to resolution with warfarin   B12 deficiency    Bilateral carotid artery stenosis    Coronary artery disease    a.) LHC 05/02/2009: CTO pRCA, oLAD, LCx --> 2v revascularization 05/05/2009: LIMA-LAD, RIMA-RCA   Former smoker    GERD (gastroesophageal reflux disease)    Heart murmur    HFrEF (heart failure with reduced ejection fraction) (HCC)    History of tobacco use    Hyperlipidemia    Hypertension    Ischemic cardiomyopathy    a.) EF as low as 20%; s/p AICD placement 08/25/2009   Long term (current) use of aspirin     Malignant melanoma in situ (HCC)    Mitral valve disease    a.) s/p MVR (28-mm Sorin 3D MEMO annuloplasty ring); performed concurrently with cardiac revascularization 05/05/2009   Myocardial infarction [facility])    a.) details unclear; pat thinks it was prior to 2010.   Neuropathy    Osteoarthritis    Osteoarthritis, hip, bilateral    Postoperative atrial fibrillation (HCC)    a.) s/p cardiac revascularization 05/05/2009 --> Tx'd with amiodarone   S/P CABG x 2 05/05/2009   a.) LIMA-LAD, RIMA-RCA   S/P MVR (mitral valve repair) 05/05/2009   a.) 28-mm Sorin  3D MEMO annuloplasty ring   Past Surgical History:  Procedure Laterality Date   CARDIAC DEFIBRILLATOR PLACEMENT N/A 08/25/2009   Procedure: CARDIAC DEFIBRILLATOR PLACEMENT   COLONOSCOPY WITH PROPOFOL  N/A 07/31/2016   Procedure: COLONOSCOPY WITH PROPOFOL ;  Surgeon: Gladis RAYMOND Mariner, MD;  Location: [facility] ENDOSCOPY;  Service: Endoscopy;  Laterality: N/A;   CORONARY ARTERY BYPASS GRAFT N/A 05/05/2009   Procedure: CORONARY ARTERY BYPASS  GRAFT; Location: [facility]; Surgon: Dorise Fellers, MD   ICD GENERATOR CHANGEOUT N/A 12/07/2019   Procedure: ICD GENERATOR CHANGEOUT;  Surgeon: Fernande Elspeth BROCKS, MD;  Location: [facility] INVASIVE CV LAB;  Service: Cardiovascular;  Laterality: N/A;   KNEE SURGERY Bilateral    x4   LEFT HEART CATH AND CORONARY ANGIOGRAPHY Left 05/02/2009   Procedure: LEFT HEART CATH AND CORONARY ANGIOGRAPHY; Location: [facility]   MITRAL VALVE REPAIR N/A 05/05/2009   Procedure: MITRAL VALVE REPAIR; Location: [facility]; Surgon: Dorise Fellers, MD   SHOULDER ARTHROSCOPY Bilateral    TOTAL HIP ARTHROPLASTY Right 06/18/2024   Procedure: ARTHROPLASTY, HIP, TOTAL, ANTERIOR APPROACH;  Surgeon: Lorelle Hussar, MD;  Location: [facility] ORS;  Service: Orthopedics;  Laterality: Right;    Allergies  No Known Allergies  Home Medications    Prior to Admission medications   Medication Sig Start Date End Date Taking? Authorizing Provider  aspirin  EC 81 MG tablet Take 1 tablet (81 mg total) by mouth daily. 11/24/19   Dunn, Bernardino HERO, PA-C  atorvastatin  (LIPITOR ) 80 MG tablet Take 1 tablet (80 mg total) by mouth daily. 03/11/24   Pender, Julie F, FNP  carvedilol  (COREG ) 25 MG tablet TAKE 1 TABLET TWICE A DAY WITH MEALS 05/03/23   Darron Deatrice LABOR, MD  empagliflozin  (JARDIANCE ) 10 MG TABS tablet Take 1 tablet (10 mg total) by mouth daily. 12/11/22   Abigail Bernardino HERO, PA-C  ezetimibe  (ZETIA ) 10 MG tablet Take 1 tablet (10 mg total) by mouth daily. 07/31/24   Dunn, Bernardino HERO, PA-C  nitroGLYCERIN  (NITROSTAT ) 0.4 MG SL tablet Place 1 tablet (0.4 mg total) under the tongue every 5 (five) minutes as needed. 03/18/24   Abigail Bernardino HERO, PA-C  Omega-3 Fatty Acids (FISH OIL PO) Take 1,200 capsules by mouth in the morning and at bedtime.    [provider]  sacubitril -valsartan  (ENTRESTO ) 24-26 MG Take 1 tablet by mouth 2 (two) times daily. 01/08/22   Abigail Bernardino HERO, PA-C  spironolactone  (ALDACTONE ) 25 MG tablet TAKE 1 TABLET EVERY DAY 03/04/24   Darron Deatrice LABOR, MD    Physical Exam    Vital Signs:  JAISEN WILTROUT does not have vital signs available for review today.  Given telephonic nature of communication, physical exam is limited. AAOx3. NAD. Normal affect.  Speech and respirations are unlabored.  Accessory Clinical Findings    None  Assessment & Plan    1.  Preoperative Cardiovascular Risk Assessment:  According to the Revised Cardiac Risk Index (RCRI), his Perioperative Risk of Major Cardiac Event is (%): 6.6. His Functional Capacity in METs is: 6.36 according to the Duke Activity Status Index (DASI).Therefore, based on ACC/AHA guidelines, patient would be at acceptable risk for the planned procedure without further cardiovascular testing.   The patient was advised that if he develops new symptoms prior to surgery to contact our office to arrange for a follow-up visit, and he verbalized understanding.  Regarding ASA therapy, we recommend continuation of ASA throughout the perioperative period.  However, if the surgeon feels that cessation of ASA is required in the  perioperative period, it may be stopped 5-7 days prior to surgery with a plan to resume it as soon as felt to be feasible from a surgical standpoint in the post-operative period.  A copy of this note will be routed to requesting surgeon.  Patient requested the copy of clearance note be sent to his PCP as well.  Time:   Today, I have spent 6 minutes with the patient with telehealth technology discussing medical history, symptoms, and management plan.     Damien JAYSON Braver, NP  09/22/2024, 2:07 PM

## 2024-09-23 DIAGNOSIS — M1612 Unilateral primary osteoarthritis, left hip: Secondary | ICD-10-CM | POA: Diagnosis not present

## 2024-09-28 ENCOUNTER — Encounter
Admission: RE | Admit: 2024-09-28 | Discharge: 2024-09-28 | Disposition: A | Source: Ambulatory Visit | Attending: Orthopedic Surgery

## 2024-09-28 DIAGNOSIS — Z01812 Encounter for preprocedural laboratory examination: Secondary | ICD-10-CM

## 2024-09-29 LAB — TYPE AND SCREEN
ABO/RH(D): O POS
Antibody Screen: NEGATIVE

## 2024-09-30 ENCOUNTER — Ambulatory Visit (INDEPENDENT_AMBULATORY_CARE_PROVIDER_SITE_OTHER)

## 2024-09-30 VITALS — BP 118/70 | Ht 70.0 in | Wt 179.0 lb

## 2024-09-30 DIAGNOSIS — Z0000 Encounter for general adult medical examination without abnormal findings: Secondary | ICD-10-CM | POA: Diagnosis not present

## 2024-09-30 MED ORDER — ORAL CARE MOUTH RINSE: 15.0000 mL | Freq: Once | OROMUCOSAL | Status: DC

## 2024-09-30 MED ORDER — CHLORHEXIDINE GLUCONATE 0.12 % MT SOLN
15.0000 mL | Freq: Once | OROMUCOSAL | Status: AC
  Administered 2024-10-01: 15 mL via OROMUCOSAL

## 2024-09-30 MED ORDER — DEXAMETHASONE SOD PHOSPHATE PF 10 MG/ML IJ SOLN: 8.0000 mg | Freq: Once | INTRAMUSCULAR | Status: DC

## 2024-09-30 MED ORDER — TRANEXAMIC ACID-NACL 1000-0.7 MG/100ML-% IV SOLN
1000.0000 mg | INTRAVENOUS | Status: AC
  Administered 2024-10-01 (×2): 1000 mg via INTRAVENOUS

## 2024-09-30 MED ORDER — CEFAZOLIN SODIUM-DEXTROSE 2-4 GM/100ML-% IV SOLN
2.0000 g | INTRAVENOUS | Status: AC
  Administered 2024-10-01: 2 g via INTRAVENOUS

## 2024-09-30 NOTE — Patient Instructions (Signed)
 [name] [name],  Thank you for taking the time for your Medicare Wellness Visit. I appreciate your continued commitment to your health goals. Please review the care plan we discussed, and feel free to reach out if I can assist you further.  Please note that Annual Wellness Visits do not include a physical exam. Some assessments may be limited, especially if the visit was conducted virtually. If needed, we may recommend an in-person follow-up with your provider.  Ongoing Care Seeing your primary care provider every 3 to 6 months helps us  monitor your health and provide consistent, personalized care.   Referrals If a referral was made during today's visit and you haven't received any updates within two weeks, please contact the referred provider directly to check on the status.  Recommended Screenings:  Health Maintenance  Topic Date Due   COVID-19 Vaccine (1) Never done   Zoster (Shingles) Vaccine (1 of 2) Never done   Medicare Annual Wellness Visit  10/08/2017   Flu Shot  05/22/2024   Colon Cancer Screening  07/31/2026   DTaP/Tdap/Td vaccine (3 - Td or Tdap) 03/11/2034   Pneumococcal Vaccine for age over 29  Completed   Hepatitis C Screening  Completed   HIV Screening  Completed   Hepatitis B Vaccine  Aged Out   Meningitis B Vaccine  Aged Out       09/28/2024    8:57 PM  Advanced Directives  Does Patient Have a Medical Advance Directive? Yes  Type of Advance Directive Healthcare Power of Attorney  Does patient want to make changes to medical advance directive? No - Patient declined  Copy of Healthcare Power of Attorney in Chart? No - copy requested    Vision: Annual vision screenings are recommended for early detection of glaucoma, cataracts, and diabetic retinopathy. These exams can also reveal signs of chronic conditions such as diabetes and high blood pressure.  Dental: Annual dental screenings help detect early signs of oral cancer, gum disease, and other conditions linked to  overall health, including heart disease and diabetes.  Please see the attached documents for additional preventive care recommendations.

## 2024-09-30 NOTE — Progress Notes (Signed)
 I connected with  [name] [name] on 09/30/24 by a audio enabled telemedicine application and verified that I am speaking with the correct person using two identifiers.  Patient Location: Home  Provider Location: Home Office  Persons Participating in Visit: Patient.  I discussed the limitations of evaluation and management by telemedicine. The patient expressed understanding and agreed to proceed.  Vital Signs: Because this visit was a virtual/telehealth visit, some criteria may be missing or patient reported. Any vitals not documented were not able to be obtained and vitals that have been documented are patient reported.   Because this visit was a virtual/telehealth visit,  certain criteria was not obtained, such a blood pressure, CBG if applicable, and timed get up and go. Any medications not marked as taking were not mentioned during the medication reconciliation part of the visit. Any vitals not documented were not able to be obtained due to this being a telehealth visit or patient was unable to self-report a recent blood pressure reading due to a lack of equipment at home via telehealth. Vitals that have been documented are verbally provided by the patient.    This visit was performed by a medical professional under my direct supervision. I was immediately available for consultation/collaboration. I have reviewed and agree with the Annual Wellness Visit documentation.  Chief Complaint  Patient presents with   Medicare Wellness     Subjective:   [name] [name] is a 63 y.o. male who presents for a Medicare Annual Wellness Visit.  Visit info / Clinical Intake: Medicare Wellness Visit Type:: Subsequent Annual Wellness Visit Persons participating in visit and providing information:: patient Medicare Wellness Visit Mode:: Telephone If telephone:: video declined Since this visit was completed virtually, some vitals may be partially provided or unavailable. Missing vitals are due to the  limitations of the virtual format.: Documented vitals are patient reported If Telephone or Video please confirm:: I connected with patient using audio/video enable telemedicine. I verified patient identity with two identifiers, discussed telehealth limitations, and patient agreed to proceed. Patient Location:: home Provider Location:: home office Interpreter Needed?: No Pre-visit prep was completed: yes AWV questionnaire completed by patient prior to visit?: yes Date:: 09/28/24 Living arrangements:: (!) lives alone Patient's Overall Health Status Rating: (Patient-Rptd) good Typical amount of pain: some Does pain affect daily life?: (!) yes (get surgery tomorrow) Are you currently prescribed opioids?: no  Dietary Habits and Nutritional Risks How many meals a day?: (Patient-Rptd) 2 Eats fruit and vegetables daily?: (Patient-Rptd) yes Most meals are obtained by: (Patient-Rptd) preparing own meals In the last 2 weeks, have you had any of the following?: none Diabetic:: no  Functional Status Activities of Daily Living (to include ambulation/medication): (Patient-Rptd) Independent Ambulation: (Patient-Rptd) Independent Medication Administration: (Patient-Rptd) Independent Home Management (perform basic housework or laundry): (Patient-Rptd) Independent Manage your own finances?: (Patient-Rptd) yes Primary transportation is: (Patient-Rptd) driving Concerns about vision?: no *vision screening is required for WTM* Concerns about hearing?: no  Fall Screening Falls in the past year?: (Patient-Rptd) 0 Number of falls in past year: 0 Was there an injury with Fall?: 0 Fall Risk Category Calculator: 0 Patient Fall Risk Level: Low Fall Risk  Fall Risk Patient at Risk for Falls Due to: No Fall Risks Fall risk Follow up: Falls evaluation completed; Falls prevention discussed  Home and Transportation Safety: All rugs have non-skid backing?: (Patient-Rptd) yes All stairs or steps have  railings?: (!) (Patient-Rptd) no Grab bars in the bathtub or shower?: (!) no Have non-skid surface in  bathtub or shower?: (Patient-Rptd) yes Good home lighting?: (Patient-Rptd) yes Regular seat belt use?: (Patient-Rptd) yes Hospital stays in the last year:: (!) (Patient-Rptd) yes How many hospital stays:: (Patient-Rptd) 1  Cognitive Assessment Difficulty concentrating, remembering, or making decisions? : (Patient-Rptd) no Will 6CIT or Mini Cog be Completed: yes What year is it?: 0 points What month is it?: 0 points Give patient an address phrase to remember (5 components): [address] [place] About what time is it?: 0 points Count backwards from 20 to 1: 0 points Say the months of the year in reverse: 0 points Repeat the address phrase from earlier: 0 points 6 CIT Score: 0 points  Advance Directives (For Healthcare) Does Patient Have a Medical Advance Directive?: Yes Does patient want to make changes to medical advance directive?: No - Patient declined Type of Advance Directive: Healthcare Power of Attorney Copy of Healthcare Power of Attorney in Chart?: No - copy requested  Reviewed/Updated  Reviewed/Updated: Reviewed All (Medical, Surgical, Family, Medications, Allergies, Care Teams, Patient Goals)    Allergies (verified) Patient has no known allergies.   Current Medications (verified) Outpatient Encounter Medications as of 09/30/2024  Medication Sig   aspirin  EC 81 MG tablet Take 1 tablet (81 mg total) by mouth daily.   atorvastatin  (LIPITOR ) 80 MG tablet Take 1 tablet (80 mg total) by mouth daily.   carvedilol  (COREG ) 25 MG tablet TAKE 1 TABLET TWICE A DAY WITH MEALS   empagliflozin  (JARDIANCE ) 10 MG TABS tablet Take 1 tablet (10 mg total) by mouth daily.   ezetimibe  (ZETIA ) 10 MG tablet Take 1 tablet (10 mg total) by mouth daily.   nitroGLYCERIN  (NITROSTAT ) 0.4 MG SL tablet Place 1 tablet (0.4 mg total) under the tongue every 5 (five) minutes as needed.    Omega-3 Fatty Acids (FISH OIL PO) Take 1,200 capsules by mouth in the morning and at bedtime.   sacubitril -valsartan  (ENTRESTO ) 24-26 MG Take 1 tablet by mouth 2 (two) times daily.   spironolactone  (ALDACTONE ) 25 MG tablet TAKE 1 TABLET EVERY DAY   No facility-administered encounter medications on file as of 09/30/2024.    History: Past Medical History:  Diagnosis Date   AICD (automatic cardioverter/defibrillator) present 08/25/2009   a.) enrolled in MADIT-RIT trial; s/p [employer] 100 AICD (SN: [id number]) placement 08/25/2009; b.) pulse generator changed 12/17/2019 --> [employer] EL AICD (SN: [id number]) placed   Apical mural thrombus    a.) Tx'd to resolution with warfarin   B12 deficiency    Bilateral carotid artery stenosis    Cancer (HCC) ?8   CHF (congestive heart failure) (HCC) ?   Coronary artery disease    a.) LHC 05/02/2009: CTO pRCA, oLAD, LCx --> 2v revascularization 05/05/2009: LIMA-LAD, RIMA-RCA   Former smoker    GERD (gastroesophageal reflux disease)    Heart murmur    HFrEF (heart failure with reduced ejection fraction) (HCC)    History of tobacco use    Hyperlipidemia    Hypertension    Ischemic cardiomyopathy    a.) EF as low as 20%; s/p AICD placement 08/25/2009   Long term (current) use of aspirin     Malignant melanoma in situ (HCC)    Mitral valve disease    a.) s/p MVR (28-mm Sorin 3D MEMO annuloplasty ring); performed concurrently with cardiac revascularization 05/05/2009   Myocardial infarction [facility])    a.) details unclear; pat thinks it was prior to 2010.   Neuropathy    Osteoarthritis    Osteoarthritis, hip,  bilateral    Postoperative atrial fibrillation (HCC)    a.) s/p cardiac revascularization 05/05/2009 --> Tx'd with amiodarone   S/P CABG x 2 05/05/2009   a.) LIMA-LAD, RIMA-RCA   S/P MVR (mitral valve repair) 05/05/2009   a.) 28-mm Sorin 3D MEMO annuloplasty ring   Past Surgical History:  Procedure Laterality Date    CARDIAC DEFIBRILLATOR PLACEMENT N/A 08/25/2009   Procedure: CARDIAC DEFIBRILLATOR PLACEMENT   CARDIAC VALVE REPLACEMENT     COLONOSCOPY WITH PROPOFOL  N/A 07/31/2016   Procedure: COLONOSCOPY WITH PROPOFOL ;  Surgeon: Gladis RAYMOND Mariner, MD;  Location: [facility] ENDOSCOPY;  Service: Endoscopy;  Laterality: N/A;   CORONARY ARTERY BYPASS GRAFT N/A 05/05/2009   Procedure: CORONARY ARTERY BYPASS GRAFT; Location: [facility]; Surgon: Dorise Fellers, MD   ICD GENERATOR CHANGEOUT N/A 12/07/2019   Procedure: ICD GENERATOR CHANGEOUT;  Surgeon: Fernande Elspeth BROCKS, MD;  Location: [facility] INVASIVE CV LAB;  Service: Cardiovascular;  Laterality: N/A;   KNEE SURGERY Bilateral    x4   LEFT HEART CATH AND CORONARY ANGIOGRAPHY Left 05/02/2009   Procedure: LEFT HEART CATH AND CORONARY ANGIOGRAPHY; Location: [place]   MITRAL VALVE REPAIR N/A 05/05/2009   Procedure: MITRAL VALVE REPAIR; Location: [facility]; Surgon: Dorise Fellers, MD   SHOULDER ARTHROSCOPY Bilateral    TOTAL HIP ARTHROPLASTY Right 06/18/2024   Procedure: ARTHROPLASTY, HIP, TOTAL, ANTERIOR APPROACH;  Surgeon: Lorelle Hussar, MD;  Location: [facility] ORS;  Service: Orthopedics;  Laterality: Right;   Family History  Problem Relation Age of Onset   Heart attack Father 56   Heart failure Mother    Heart attack Mother    Hyperlipidemia Other    Social History   Occupational History   Not on file  Tobacco Use   Smoking status: Former    Current packs/day: 0.00    Average packs/day: 1 pack/day for 37.0 years (37.0 ttl pk-yrs)    Types: Cigarettes    Start date: 05/02/1972    Quit date: 05/02/2009    Years since quitting: 15.4   Smokeless tobacco: Never  Vaping Use   Vaping status: Never Used  Substance and Sexual Activity   Alcohol use: Never   Drug use: Never   Sexual activity: Not Currently   Tobacco Counseling Counseling given: Not Answered  SDOH Screenings   Food Insecurity: No Food Insecurity (09/30/2024)  Housing: Low Risk  (09/30/2024)   Transportation Needs: No Transportation Needs (09/30/2024)  Utilities: Not At Risk (09/30/2024)  Alcohol Screen: Low Risk  (03/11/2024)  Depression (PHQ2-9): Low Risk  (09/30/2024)  Financial Resource Strain: Low Risk  (09/23/2024)   Received from [facility]  Physical Activity: Sufficiently Active (09/30/2024)  Social Connections: Unknown (09/30/2024)  Stress: No Stress Concern Present (09/30/2024)  Tobacco Use: Medium Risk (09/30/2024)  Health Literacy: Adequate Health Literacy (09/30/2024)   See flowsheets for full screening details  Depression Screen PHQ 2 & 9 Depression Scale- Over the past 2 weeks, how often have you been bothered by any of the following problems? Little interest or pleasure in doing things: 0 Feeling down, depressed, or hopeless (PHQ Adolescent also includes...irritable): 0 PHQ-2 Total Score: 0 Trouble falling or staying asleep, or sleeping too much: 0 Feeling tired or having little energy: 0 Poor appetite or overeating (PHQ Adolescent also includes...weight loss): 0 Feeling bad about yourself - or that you are a failure or have let yourself or your family down: 0 Trouble concentrating on things, such as reading the newspaper or watching television (PHQ Adolescent also includes...like school  work): 0 Moving or speaking so slowly that other people could have noticed. Or the opposite - being so fidgety or restless that you have been moving around a lot more than usual: 0 Thoughts that you would be better off dead, or of hurting yourself in some way: 0 PHQ-9 Total Score: 0 If you checked off any problems, how difficult have these problems made it for you to do your work, take care of things at home, or get along with other people?: Not difficult at all  Depression Treatment Depression Interventions/Treatment : [id number] Score <4 Follow-up Not Indicated     Goals Addressed               This Visit's Progress     Patient Stated (pt-stated)         Patient would like to build strength back in his legs              Objective:    Today's Vitals   09/30/24 1332  BP: 118/70  Weight: 179 lb (81.2 kg)  Height: 5' 10 (1.778 m)   Body mass index is 25.68 kg/m.  Hearing/Vision screen Hearing Screening - Comments:: No hearing issues  Vision Screening - Comments:: Patient wears wears glasses Immunizations and Health Maintenance Health Maintenance  Topic Date Due   COVID-19 Vaccine (1) Never done   Zoster Vaccines- Shingrix (1 of 2) Never done   Medicare Annual Wellness (AWV)  10/08/2017   Influenza Vaccine  05/22/2024   Colonoscopy  07/31/2026   DTaP/Tdap/Td (3 - Td or Tdap) 03/11/2034   Pneumococcal Vaccine: 50+ Years  Completed   Hepatitis C Screening  Completed   HIV Screening  Completed   Hepatitis B Vaccines 19-59 Average Risk  Aged Out   Meningococcal B Vaccine  Aged Out        Assessment/Plan:  This is a routine wellness examination for [name] [name].  Patient Care Team: Gareth Mliss FALCON, FNP as PCP - General (Nurse Practitioner) Darron Deatrice LABOR, MD as PCP - Cardiology (Cardiology) Fernande Elspeth BROCKS, MD (Inactive) as PCP - Electrophysiology (Cardiology) Darron Deatrice LABOR, MD as Consulting Physician (Cardiology) Riddle, Suzann, NP as Nurse Practitioner (Clinical Cardiac Electrophysiology)  I have personally reviewed and noted the following in the patients chart:   Medical and social history Use of alcohol, tobacco or illicit drugs  Current medications and supplements including opioid prescriptions. Functional ability and status Nutritional status Physical activity Advanced directives List of other physicians Hospitalizations, surgeries, and ER visits in previous 12 months Vitals Screenings to include cognitive, depression, and falls Referrals and appointments  No orders of the defined types were placed in this encounter.  In addition, I have reviewed and discussed with patient certain preventive  protocols, quality metrics, and best practice recommendations. A written personalized care plan for preventive services as well as general preventive health recommendations were provided to patient.   [name] [name] Right, [place]   09/30/2024   No follow-ups on file.  After Visit Summary: (MyChart) Due to this being a telephonic visit, the after visit summary with patients personalized plan was offered to patient via MyChart  4  No voiced or noted concerns at this time

## 2024-10-01 ENCOUNTER — Ambulatory Visit
Admission: RE | Admit: 2024-10-01 | Discharge: 2024-10-02 | Disposition: A | Attending: Orthopedic Surgery | Admitting: Orthopedic Surgery

## 2024-10-01 ENCOUNTER — Other Ambulatory Visit: Payer: Self-pay

## 2024-10-01 ENCOUNTER — Ambulatory Visit: Payer: Self-pay | Admitting: Urgent Care

## 2024-10-01 ENCOUNTER — Ambulatory Visit

## 2024-10-01 ENCOUNTER — Encounter: Payer: Self-pay | Admitting: Orthopedic Surgery

## 2024-10-01 ENCOUNTER — Encounter: Admission: RE | Disposition: A | Payer: Self-pay | Source: Home / Self Care | Attending: Orthopedic Surgery

## 2024-10-01 DIAGNOSIS — Z7982 Long term (current) use of aspirin: Secondary | ICD-10-CM | POA: Insufficient documentation

## 2024-10-01 DIAGNOSIS — M1612 Unilateral primary osteoarthritis, left hip: Secondary | ICD-10-CM | POA: Insufficient documentation

## 2024-10-01 DIAGNOSIS — Z79899 Other long term (current) drug therapy: Secondary | ICD-10-CM | POA: Insufficient documentation

## 2024-10-01 DIAGNOSIS — I2510 Atherosclerotic heart disease of native coronary artery without angina pectoris: Secondary | ICD-10-CM | POA: Insufficient documentation

## 2024-10-01 DIAGNOSIS — Z7984 Long term (current) use of oral hypoglycemic drugs: Secondary | ICD-10-CM | POA: Insufficient documentation

## 2024-10-01 DIAGNOSIS — K219 Gastro-esophageal reflux disease without esophagitis: Secondary | ICD-10-CM | POA: Insufficient documentation

## 2024-10-01 DIAGNOSIS — Z7901 Long term (current) use of anticoagulants: Secondary | ICD-10-CM | POA: Insufficient documentation

## 2024-10-01 DIAGNOSIS — I110 Hypertensive heart disease with heart failure: Secondary | ICD-10-CM | POA: Insufficient documentation

## 2024-10-01 DIAGNOSIS — E785 Hyperlipidemia, unspecified: Secondary | ICD-10-CM | POA: Insufficient documentation

## 2024-10-01 DIAGNOSIS — Z87891 Personal history of nicotine dependence: Secondary | ICD-10-CM | POA: Insufficient documentation

## 2024-10-01 DIAGNOSIS — I252 Old myocardial infarction: Secondary | ICD-10-CM | POA: Insufficient documentation

## 2024-10-01 DIAGNOSIS — I5022 Chronic systolic (congestive) heart failure: Secondary | ICD-10-CM | POA: Insufficient documentation

## 2024-10-01 DIAGNOSIS — R0602 Shortness of breath: Secondary | ICD-10-CM | POA: Insufficient documentation

## 2024-10-01 DIAGNOSIS — I4891 Unspecified atrial fibrillation: Secondary | ICD-10-CM | POA: Insufficient documentation

## 2024-10-01 DIAGNOSIS — Z791 Long term (current) use of non-steroidal anti-inflammatories (NSAID): Secondary | ICD-10-CM | POA: Insufficient documentation

## 2024-10-01 DIAGNOSIS — Z96642 Presence of left artificial hip joint: Secondary | ICD-10-CM | POA: Diagnosis present

## 2024-10-01 HISTORY — PX: TOTAL HIP ARTHROPLASTY: SHX124

## 2024-10-01 HISTORY — DX: Unspecified osteoarthritis, unspecified site: M19.90

## 2024-10-01 SURGERY — ARTHROPLASTY, HIP, TOTAL, ANTERIOR APPROACH
Anesthesia: General | Site: Hip | Laterality: Left

## 2024-10-01 MED ORDER — TRANEXAMIC ACID-NACL 1000-0.7 MG/100ML-% IV SOLN
INTRAVENOUS | Status: AC
  Filled 2024-10-01: qty 100

## 2024-10-01 MED ORDER — KETOROLAC TROMETHAMINE 15 MG/ML IJ SOLN
15.0000 mg | Freq: Four times a day (QID) | INTRAMUSCULAR | Status: AC
  Administered 2024-10-01 – 2024-10-02 (×4): 15 mg via INTRAVENOUS
  Filled 2024-10-01 (×4): qty 1

## 2024-10-01 MED ORDER — DEXAMETHASONE SOD PHOSPHATE PF 10 MG/ML IJ SOLN
INTRAMUSCULAR | Status: DC | PRN
  Administered 2024-10-01: 10 mg via INTRAVENOUS

## 2024-10-01 MED ORDER — GLYCOPYRROLATE 0.2 MG/ML IJ SOLN
INTRAMUSCULAR | Status: DC | PRN
  Administered 2024-10-01: .2 mg via INTRAVENOUS

## 2024-10-01 MED ORDER — METOCLOPRAMIDE HCL 5 MG/ML IJ SOLN: 5.0000 mg | Freq: Three times a day (TID) | INTRAMUSCULAR | Status: DC | PRN

## 2024-10-01 MED ORDER — DOCUSATE SODIUM 100 MG PO CAPS
100.0000 mg | ORAL_CAPSULE | Freq: Two times a day (BID) | ORAL | Status: DC
  Administered 2024-10-01 – 2024-10-02 (×3): 100 mg via ORAL
  Filled 2024-10-01 (×3): qty 1

## 2024-10-01 MED ORDER — EZETIMIBE 10 MG PO TABS
10.0000 mg | ORAL_TABLET | Freq: Every day | ORAL | Status: DC
  Administered 2024-10-01: 10 mg via ORAL

## 2024-10-01 MED ORDER — KETAMINE HCL 50 MG/5ML IJ SOSY
PREFILLED_SYRINGE | INTRAMUSCULAR | Status: DC | PRN
  Administered 2024-10-01: 30 mg via INTRAVENOUS
  Administered 2024-10-01 (×2): 10 mg via INTRAVENOUS

## 2024-10-01 MED ORDER — FENTANYL CITRATE (PF) 100 MCG/2ML IJ SOLN
INTRAMUSCULAR | Status: AC
  Filled 2024-10-01: qty 2

## 2024-10-01 MED ORDER — PHENOL 1.4 % MT LIQD: 1.0000 | OROMUCOSAL | Status: DC | PRN

## 2024-10-01 MED ORDER — CHLORHEXIDINE GLUCONATE 0.12 % MT SOLN
OROMUCOSAL | Status: AC
  Filled 2024-10-01: qty 15

## 2024-10-01 MED ORDER — SODIUM CHLORIDE (PF) 0.9 % IJ SOLN
INTRAMUSCULAR | Status: DC | PRN
  Administered 2024-10-01: 50 mL

## 2024-10-01 MED ORDER — ATORVASTATIN CALCIUM 20 MG PO TABS
80.0000 mg | ORAL_TABLET | Freq: Every day | ORAL | Status: DC
  Administered 2024-10-01 – 2024-10-02 (×2): 80 mg via ORAL
  Filled 2024-10-01 (×2): qty 8

## 2024-10-01 MED ORDER — ACETAMINOPHEN 500 MG PO TABS
1000.0000 mg | ORAL_TABLET | Freq: Three times a day (TID) | ORAL | Status: DC
  Administered 2024-10-02: 1000 mg via ORAL
  Filled 2024-10-01 (×3): qty 2

## 2024-10-01 MED ORDER — ASPIRIN 81 MG PO TBEC
81.0000 mg | DELAYED_RELEASE_TABLET | Freq: Every day | ORAL | Status: DC
  Administered 2024-10-02: 81 mg via ORAL
  Filled 2024-10-01: qty 1

## 2024-10-01 MED ORDER — PROPOFOL 1000 MG/100ML IV EMUL
INTRAVENOUS | Status: AC
  Filled 2024-10-01: qty 100

## 2024-10-01 MED ORDER — EZETIMIBE 10 MG PO TABS
ORAL_TABLET | ORAL | Status: AC
  Filled 2024-10-01: qty 1

## 2024-10-01 MED ORDER — FENTANYL CITRATE (PF) 100 MCG/2ML IJ SOLN
25.0000 ug | INTRAMUSCULAR | Status: DC | PRN
  Administered 2024-10-01: 50 ug via INTRAVENOUS

## 2024-10-01 MED ORDER — PANTOPRAZOLE SODIUM 40 MG PO TBEC
40.0000 mg | DELAYED_RELEASE_TABLET | Freq: Every day | ORAL | Status: DC
  Administered 2024-10-01 – 2024-10-02 (×2): 40 mg via ORAL
  Filled 2024-10-01 (×2): qty 1

## 2024-10-01 MED ORDER — CEFAZOLIN SODIUM-DEXTROSE 2-4 GM/100ML-% IV SOLN
INTRAVENOUS | Status: AC
  Filled 2024-10-01: qty 100

## 2024-10-01 MED ORDER — KETAMINE HCL 50 MG/5ML IJ SOSY
PREFILLED_SYRINGE | INTRAMUSCULAR | Status: AC
  Filled 2024-10-01: qty 5

## 2024-10-01 MED ORDER — KETOROLAC TROMETHAMINE 15 MG/ML IJ SOLN
INTRAMUSCULAR | Status: AC
  Filled 2024-10-01: qty 1

## 2024-10-01 MED ORDER — MIDAZOLAM HCL 2 MG/2ML IJ SOLN
INTRAMUSCULAR | Status: AC
  Filled 2024-10-01: qty 2

## 2024-10-01 MED ORDER — SODIUM CHLORIDE 0.9 % IR SOLN
Status: DC | PRN
  Administered 2024-10-01: 100 mL

## 2024-10-01 MED ORDER — PHENYLEPHRINE HCL-NACL 20-0.9 MG/250ML-% IV SOLN
INTRAVENOUS | Status: DC | PRN
  Administered 2024-10-01: 40 ug/min via INTRAVENOUS

## 2024-10-01 MED ORDER — DROPERIDOL 2.5 MG/ML IJ SOLN: 0.6250 mg | Freq: Once | INTRAMUSCULAR | Status: DC | PRN

## 2024-10-01 MED ORDER — ROCURONIUM BROMIDE 100 MG/10ML IV SOLN
INTRAVENOUS | Status: DC | PRN
  Administered 2024-10-01: 50 mg via INTRAVENOUS
  Administered 2024-10-01: 20 mg via INTRAVENOUS

## 2024-10-01 MED ORDER — ONDANSETRON HCL 4 MG/2ML IJ SOLN
INTRAMUSCULAR | Status: DC | PRN
  Administered 2024-10-01: 4 mg via INTRAVENOUS

## 2024-10-01 MED ORDER — SODIUM CHLORIDE (PF) 0.9 % IJ SOLN
INTRAMUSCULAR | Status: AC
  Filled 2024-10-01: qty 10

## 2024-10-01 MED ORDER — OXYCODONE HCL 5 MG PO TABS
2.5000 mg | ORAL_TABLET | Freq: Three times a day (TID) | ORAL | 0 refills | Status: AC | PRN
  Filled 2024-10-01: qty 20, 7d supply, fill #0

## 2024-10-01 MED ORDER — PROPOFOL 10 MG/ML IV BOLUS
INTRAVENOUS | Status: DC | PRN
  Administered 2024-10-01: 20 mg via INTRAVENOUS
  Administered 2024-10-01: 10 mg via INTRAVENOUS
  Administered 2024-10-01: 100 mg via INTRAVENOUS

## 2024-10-01 MED ORDER — TRAMADOL HCL 50 MG PO TABS
50.0000 mg | ORAL_TABLET | Freq: Four times a day (QID) | ORAL | 0 refills | Status: AC | PRN
  Filled 2024-10-01: qty 30, 8d supply, fill #0

## 2024-10-01 MED ORDER — ONDANSETRON HCL 4 MG PO TABS: 4.0000 mg | ORAL_TABLET | Freq: Four times a day (QID) | ORAL | Status: DC | PRN

## 2024-10-01 MED ORDER — BUPIVACAINE-EPINEPHRINE (PF) 0.25% -1:200000 IJ SOLN
INTRAMUSCULAR | Status: AC
  Filled 2024-10-01: qty 30

## 2024-10-01 MED ORDER — BUPIVACAINE LIPOSOME 1.3 % IJ SUSP
INTRAMUSCULAR | Status: AC
  Filled 2024-10-01: qty 20

## 2024-10-01 MED ORDER — CARVEDILOL 12.5 MG PO TABS
25.0000 mg | ORAL_TABLET | Freq: Two times a day (BID) | ORAL | Status: DC
  Administered 2024-10-01 – 2024-10-02 (×2): 25 mg via ORAL
  Filled 2024-10-01 (×2): qty 8

## 2024-10-01 MED ORDER — MORPHINE SULFATE (PF) 2 MG/ML IV SOLN: 0.5000 mg | INTRAVENOUS | Status: DC | PRN

## 2024-10-01 MED ORDER — ACETAMINOPHEN 500 MG PO TABS
1000.0000 mg | ORAL_TABLET | Freq: Three times a day (TID) | ORAL | 0 refills | Status: AC
  Filled 2024-10-01: qty 30, 5d supply, fill #0

## 2024-10-01 MED ORDER — ONDANSETRON HCL 4 MG/2ML IJ SOLN: 4.0000 mg | Freq: Four times a day (QID) | INTRAMUSCULAR | Status: DC | PRN

## 2024-10-01 MED ORDER — HYDROCODONE-ACETAMINOPHEN 5-325 MG PO TABS: 1.0000 | ORAL_TABLET | ORAL | Status: DC | PRN

## 2024-10-01 MED ORDER — MENTHOL 3 MG MT LOZG: 1.0000 | LOZENGE | OROMUCOSAL | Status: DC | PRN

## 2024-10-01 MED ORDER — ENOXAPARIN SODIUM 40 MG/0.4ML IJ SOSY
40.0000 mg | PREFILLED_SYRINGE | INTRAMUSCULAR | Status: DC
  Administered 2024-10-02: 40 mg via SUBCUTANEOUS
  Filled 2024-10-01: qty 0.4

## 2024-10-01 MED ORDER — SUGAMMADEX SODIUM 500 MG/5ML IV SOLN
INTRAVENOUS | Status: DC | PRN
  Administered 2024-10-01: 50 mg via INTRAVENOUS
  Administered 2024-10-01: 150 mg via INTRAVENOUS

## 2024-10-01 MED ORDER — PROPOFOL 10 MG/ML IV BOLUS
INTRAVENOUS | Status: AC
  Filled 2024-10-01: qty 20

## 2024-10-01 MED ORDER — SODIUM CHLORIDE 0.9 % IV SOLN: INTRAVENOUS | Status: DC

## 2024-10-01 MED ORDER — FENTANYL CITRATE (PF) 100 MCG/2ML IJ SOLN
INTRAMUSCULAR | Status: DC | PRN
  Administered 2024-10-01: 50 ug via INTRAVENOUS
  Administered 2024-10-01 (×2): 25 ug via INTRAVENOUS

## 2024-10-01 MED ORDER — ACETAMINOPHEN 325 MG PO TABS: 325.0000 mg | ORAL_TABLET | Freq: Four times a day (QID) | ORAL | Status: DC | PRN

## 2024-10-01 MED ORDER — ONDANSETRON HCL 4 MG PO TABS
4.0000 mg | ORAL_TABLET | Freq: Four times a day (QID) | ORAL | 0 refills | Status: AC | PRN
  Filled 2024-10-01: qty 20, 5d supply, fill #0

## 2024-10-01 MED ORDER — CEFAZOLIN SODIUM-DEXTROSE 2-4 GM/100ML-% IV SOLN
2.0000 g | Freq: Four times a day (QID) | INTRAVENOUS | Status: AC
  Administered 2024-10-01 (×2): 2 g via INTRAVENOUS
  Filled 2024-10-01 (×2): qty 100

## 2024-10-01 MED ORDER — ENOXAPARIN SODIUM 40 MG/0.4ML IJ SOSY
40.0000 mg | PREFILLED_SYRINGE | INTRAMUSCULAR | 0 refills | Status: AC
  Filled 2024-10-01: qty 5.6, 14d supply, fill #0

## 2024-10-01 MED ORDER — METOCLOPRAMIDE HCL 10 MG PO TABS: 5.0000 mg | ORAL_TABLET | Freq: Three times a day (TID) | ORAL | Status: DC | PRN

## 2024-10-01 MED ORDER — ACETAMINOPHEN 10 MG/ML IV SOLN
INTRAVENOUS | Status: AC
  Filled 2024-10-01: qty 100

## 2024-10-01 MED ORDER — DOCUSATE SODIUM 100 MG PO CAPS
100.0000 mg | ORAL_CAPSULE | Freq: Two times a day (BID) | ORAL | 0 refills | Status: AC
  Filled 2024-10-01: qty 10, 5d supply, fill #0

## 2024-10-01 MED ORDER — HYDROMORPHONE HCL 1 MG/ML IJ SOLN
INTRAMUSCULAR | Status: AC
  Filled 2024-10-01: qty 1

## 2024-10-01 MED ORDER — MIDAZOLAM HCL (PF) 2 MG/2ML IJ SOLN
INTRAMUSCULAR | Status: DC | PRN
  Administered 2024-10-01: 2 mg via INTRAVENOUS

## 2024-10-01 MED ORDER — HYDROMORPHONE HCL 1 MG/ML IJ SOLN
INTRAMUSCULAR | Status: DC | PRN
  Administered 2024-10-01 (×2): .5 mg via INTRAVENOUS

## 2024-10-01 MED ORDER — 0.9 % SODIUM CHLORIDE (POUR BTL) OPTIME
TOPICAL | Status: DC | PRN
  Administered 2024-10-01: 500 mL

## 2024-10-01 MED ORDER — TRAMADOL HCL 50 MG PO TABS
50.0000 mg | ORAL_TABLET | Freq: Four times a day (QID) | ORAL | Status: DC | PRN
  Administered 2024-10-01: 50 mg via ORAL

## 2024-10-01 MED ORDER — SURGIPHOR WOUND IRRIGATION SYSTEM - OPTIME: TOPICAL | Status: DC | PRN

## 2024-10-01 MED ORDER — SPIRONOLACTONE 25 MG PO TABS
25.0000 mg | ORAL_TABLET | Freq: Every day | ORAL | Status: DC
  Administered 2024-10-01 – 2024-10-02 (×2): 25 mg via ORAL
  Filled 2024-10-01 (×2): qty 1

## 2024-10-01 MED ORDER — LIDOCAINE HCL (CARDIAC) PF 100 MG/5ML IV SOSY
PREFILLED_SYRINGE | INTRAVENOUS | Status: DC | PRN
  Administered 2024-10-01: 80 mg via INTRAVENOUS

## 2024-10-01 MED ORDER — TRAMADOL HCL 50 MG PO TABS
ORAL_TABLET | ORAL | Status: AC
  Filled 2024-10-01: qty 1

## 2024-10-01 MED ORDER — LACTATED RINGERS IV SOLN: INTRAVENOUS | Status: DC

## 2024-10-01 MED ORDER — ACETAMINOPHEN 10 MG/ML IV SOLN
INTRAVENOUS | Status: DC | PRN
  Administered 2024-10-01: 1000 mg via INTRAVENOUS

## 2024-10-01 MED ADMIN — Sacubitril-Valsartan Tab 24-26 MG: 1 | ORAL | NDC 72205028018

## 2024-10-01 MED FILL — Celecoxib Cap 200 MG: 200.0000 mg | ORAL | 14 days supply | Qty: 28 | Fill #0 | Status: AC

## 2024-10-01 MED FILL — Sacubitril-Valsartan Tab 24-26 MG: 1.0000 | ORAL | Qty: 1 | Status: AC

## 2024-10-01 SURGICAL SUPPLY — 57 items
BLADE CLIPPER SURG (BLADE) IMPLANT
BLADE SAGITTAL AGGR TOOTH XLG (BLADE) ×1 IMPLANT
BNDG COHESIVE 4X5 TAN STRL (GAUZE/BANDAGES/DRESSINGS) ×1 IMPLANT
BNDG COHESIVE 6X5 TAN ST LF (GAUZE/BANDAGES/DRESSINGS) ×2 IMPLANT
BRUSH SCRUB EZ PLAIN DRY (MISCELLANEOUS) ×1 IMPLANT
CHLORAPREP W/TINT 26 (MISCELLANEOUS) ×1 IMPLANT
DERMABOND ADVANCED .7 DNX12 (GAUZE/BANDAGES/DRESSINGS) ×1 IMPLANT
DRAPE C-ARM XRAY 36X54 (DRAPES) ×1 IMPLANT
DRAPE SHEET LG 3/4 BI-LAMINATE (DRAPES) ×2 IMPLANT
DRAPE TABLE BACK 80X90 (DRAPES) ×1 IMPLANT
DRSG MEPILEX SACRM 8.7X9.8 (GAUZE/BANDAGES/DRESSINGS) ×1 IMPLANT
DRSG OPSITE POSTOP 4X8 (GAUZE/BANDAGES/DRESSINGS) ×1 IMPLANT
DRSG TEGADERM 4X4.75 (GAUZE/BANDAGES/DRESSINGS) IMPLANT
ELECTRODE BLDE 4.0 EZ CLN MEGD (MISCELLANEOUS) ×1 IMPLANT
ELECTRODE REM PT RTRN 9FT ADLT (ELECTROSURGICAL) ×1 IMPLANT
GLOVE BIO SURGEON STRL SZ8 (GLOVE) ×1 IMPLANT
GLOVE BIOGEL PI IND STRL 8 (GLOVE) ×1 IMPLANT
GLOVE PI ORTHO PRO STRL 7.5 (GLOVE) ×2 IMPLANT
GLOVE PI ORTHO PRO STRL SZ8 (GLOVE) ×2 IMPLANT
GLOVE SURG SYN 7.5 PF PI (GLOVE) ×1 IMPLANT
GOWN SRG XL LONG LVL 3 NONREIN (GOWNS) ×1 IMPLANT
GOWN SRG XL LVL 3 NONREINFORCE (GOWNS) ×1 IMPLANT
GOWN STRL REUS W/ TWL LRG LVL3 (GOWN DISPOSABLE) ×1 IMPLANT
HEAD CERAMIC FEMORAL 36MM (Head) IMPLANT
HOOD PEEL AWAY T7 (MISCELLANEOUS) ×2 IMPLANT
INSERT TRIDENT POLY 36 0DEG (Insert) IMPLANT
IV NS 100ML SINGLE PACK (IV SOLUTION) ×1 IMPLANT
KIT PATIENT CARE HANA TABLE (KITS) ×1 IMPLANT
KIT TURNOVER CYSTO (KITS) ×1 IMPLANT
LIGHT WAVEGUIDE WIDE FLAT (MISCELLANEOUS) ×1 IMPLANT
MANIFOLD NEPTUNE II (INSTRUMENTS) ×1 IMPLANT
MARKER SKIN DUAL TIP RULER LAB (MISCELLANEOUS) ×1 IMPLANT
MAT ABSORB FLUID 56X50 GRAY (MISCELLANEOUS) ×1 IMPLANT
NDL SPNL 20GX3.5 QUINCKE YW (NEEDLE) ×1 IMPLANT
NS IRRIG 500ML POUR BTL (IV SOLUTION) ×1 IMPLANT
PACK HIP COMPR (MISCELLANEOUS) ×1 IMPLANT
PAD ARMBOARD POSITIONER FOAM (MISCELLANEOUS) ×1 IMPLANT
PENCIL SMOKE EVACUATOR (MISCELLANEOUS) ×1 IMPLANT
SCREW HEX LP 6.5X20 (Screw) IMPLANT
SCREW HEX LP 6.5X25 (Screw) IMPLANT
SHELL CLUSTERHOLE ACETABULAR 5 (Shell) IMPLANT
SLEEVE SCD COMPRESS KNEE MED (STOCKING) ×1 IMPLANT
SOLN STERILE WATER BTL 1000 ML (IV SOLUTION) ×1 IMPLANT
SOLUTION IRRIG SURGIPHOR (IV SOLUTION) ×1 IMPLANT
STEM HIGH OFFSET SZ6 38.5X107 (Stem) IMPLANT
SURGIFLO W/THROMBIN 8M KIT (HEMOSTASIS) IMPLANT
SUT BONE WAX W31G (SUTURE) ×1 IMPLANT
SUT ETHIBOND 2 V 37 (SUTURE) ×1 IMPLANT
SUT SILK 0 30XBRD TIE 6 (SUTURE) ×1 IMPLANT
SUT STRATAFIX 14 PDO 36 VLT (SUTURE) ×1 IMPLANT
SUT VIC AB 0 CT1 36 (SUTURE) ×1 IMPLANT
SUT VIC AB 2-0 CT2 27 (SUTURE) ×1 IMPLANT
SUTURE STRATA SPIR 4-0 18 (SUTURE) ×1 IMPLANT
SYR 20ML LL LF (SYRINGE) ×2 IMPLANT
TAPE MICROFOAM 4IN (TAPE) IMPLANT
TRAP FLUID SMOKE EVACUATOR (MISCELLANEOUS) ×1 IMPLANT
WAND WEREWOLF FASTSEAL 6.0 (MISCELLANEOUS) ×1 IMPLANT

## 2024-10-01 NOTE — H&P (Signed)
 History of Present Illness: [name] [name] is an 63 y.o. male who presents for history and physical for left anterior total hip arthroplasty with Dr. Lorelle on 10/01/2024. He has had years of increasing pain, stiffness and instability of his left hip. He underwent a successful right total hip arthroplasty in August 2025. He is very pleased with his surgery and results. He has x-rays showing advanced left hip arthritis with complete loss of joint space. He has deformity to the femoral head. Pain is 10 out of 10 located in his groin lateral hip and thigh. He describes aching pain worse with weightbearing his pain interferes with his quality of life and activities of daily living and he has had no relief with conservative treatment consisting of medications, activity modification and injections  Nondiabetic with an A1c of 5.5 and a BMI of 27  Past Medical History: Past Medical History:  Diagnosis Date  Heart disease  Tubular adenoma of colon 07/31/2016   Past Surgical History: Past Surgical History:  Procedure Laterality Date  COLONOSCOPY 07/31/2016  Tubular adenoma of colon/Internal hemorrhoids/Repeat 34yrs/MUS  knee surgery Bilateral  REPLACEMENT MITRAL VALVE VIA HEART PORT Left   Past Family History: Family History  Problem Relation Age of Onset  Colon cancer Neg Hx  Colon polyps Neg Hx  Rectal cancer Neg Hx  Liver disease Neg Hx   Medications: Current Outpatient Medications  Medication Sig Dispense Refill  aspirin  81 MG EC tablet Take 81 mg by mouth once daily.  atorvastatin  (LIPITOR ) 80 MG tablet  carvedilol  (COREG ) 25 MG tablet  diclofenac  (VOLTAREN ) 1 % topical gel Apply 2 g topically 4 (four) times daily  empagliflozin  (JARDIANCE ) 10 mg tablet Take 1 tablet by mouth once daily  ezetimibe  (ZETIA ) 10 mg tablet Take 1 tablet by mouth once daily  nitroGLYcerin  (NITROSTAT ) 0.4 MG SL tablet Place 0.4 mg under the tongue every 5 (five) minutes as needed  omega-3 fatty acids-fish  oil 360-1,200 mg Cap Take 2,400 mg by mouth once daily  sacubitriL -valsartan  (ENTRESTO ) 24-26 mg tablet Take 1 tablet by mouth 2 (two) times daily  spironolactone  (ALDACTONE ) 25 MG tablet   No current facility-administered medications for this visit.   Allergies: No Known Allergies   Visit Vitals: Vitals:  09/23/24 0853  BP: 118/62    Review of Systems:  A comprehensive 14 point ROS was performed, reviewed, and the pertinent orthopaedic findings are documented in the HPI.  Physical Exam: Body mass index is 27 kg/m. BP 118/62  Ht 177.8 cm (5' 10)  Wt 85.4 kg (188 lb 3.2 oz)  BMI 27.00 kg/m  General:  Well developed, well nourished, no apparent distress, normal affect, antalgic gait with no assistive device  HEENT: Head normocephalic, atraumatic, PERRL.   Abdomen: Soft, non tender, non distended, Bowel sounds present.  Heart: Examination of the heart reveals regular, rate, and rhythm. There is no murmur noted on ascultation. There is a normal apical pulse.  Lungs: Lungs are clear to auscultation. There is no wheeze, rhonchi, or crackles. There is normal expansion of bilateral chest walls.   Left hip exam  SKIN: intact SWELLING: none WARMTH: no warmth TENDERNESS: none, Stinchfield Positive on left ROM: Left hip has 5 degrees of internal rotation and 20 degrees of external rotation hip flexion to 95 degrees pain with internal and external rotation localized to the groin.  STRENGTH: limited by pain GAIT: Trendelenburg/wobbling gait on left side  STABILITY: stable to testing CREPITUS: yes NEUROLOGICAL EXAM: normal VASCULAR EXAM: normal LUMBAR  SPINE: tenderness: no straight leg raising sign: no motor exam: normal  Hip Imaging :  Left hip x-rays reviewed by me today from 07/31/2024 show lateral subluxation of the femoral head with complete loss of joint space and high riding femoral head with deformity of the femoral head. Loose bodies noted. Large inferior  acetabular spurring  Assessment:  Severe left hip osteoarthritis  Plan: [name] [name] is a 63 year old male with severe left hip osteoarthritis. He has had years of increasing pain stiffness and debilitation. His pain interferes with his quality of life and activities day living. Risks, benefits, complications of a left anterior total hip arthroplasty were discussed with the patient. Patient has agreed and consented procedure with Dr. Lorelle on 10/01/2024  The hospitalization and post-operative care and rehabilitation were also discussed. The use of perioperative antibiotics and DVT prophylaxis were discussed. The risk, benefits and alternatives to a surgical intervention were discussed at length with the patient. The patient was also advised of risks related to the medical comorbidities and elevated body mass index (BMI). A lengthy discussion took place to review the most common complications including but not limited to: deep vein thrombosis, pulmonary embolus, heart attack, stroke, infection, wound breakdown, heterotopic ossification, dislocation, numbness, leg length in-equality, intraoperative fracture, damage to nerves, tendon,muscles, arteries or other blood vessels, death and other possible complications from anesthesia. The patient was told that we will take steps to minimize these risks by using sterile technique, antibiotics and DVT prophylaxis when appropriate and follow the patient postoperatively in the office setting to monitor progress. The possibility of recurrent pain, no improvement in pain and actual worsening of pain were also discussed with the patient. The risk of dislocation following total hip replacement was discussed and potential precautions to prevent dislocation were reviewed.   All questions answered patient agrees with plan for left anterior total hip arthroplasty.

## 2024-10-01 NOTE — Plan of Care (Signed)
 Patient verbalizes understanding of plan of care, discharge instructions

## 2024-10-01 NOTE — Anesthesia Procedure Notes (Addendum)
 Procedure Name: Intubation Date/Time: 10/01/2024 7:39 AM  Performed by: Carley Pac, RNPre-anesthesia Checklist: Patient identified, Patient being monitored, Timeout performed, Emergency Drugs available and Suction available Patient Re-evaluated:Patient Re-evaluated prior to induction Oxygen Delivery Method: Circle system utilized Preoxygenation: Pre-oxygenation with 100% oxygen Induction Type: IV induction Ventilation: Mask ventilation without difficulty Laryngoscope Size: Mac, McGrath and 4 Grade View: Grade I Tube type: Oral Tube size: 7.0 mm Number of attempts: 1 Airway Equipment and Method: Stylet Placement Confirmation: ETT inserted through vocal cords under direct vision, positive ETCO2 and breath sounds checked- equal and bilateral Secured at: 23 cm Tube secured with: Tape Dental Injury: Teeth and Oropharynx as per pre-operative assessment

## 2024-10-01 NOTE — Anesthesia Postprocedure Evaluation (Signed)
 Anesthesia Post Note  Patient: [name]  Procedure(s) Performed: ARTHROPLASTY, HIP, TOTAL, ANTERIOR APPROACH (Left: Hip)  Patient location during evaluation: PACU Anesthesia Type: General Level of consciousness: awake and alert Pain management: pain level controlled Vital Signs Assessment: post-procedure vital signs reviewed and stable Respiratory status: spontaneous breathing, nonlabored ventilation, respiratory function stable and patient connected to nasal cannula oxygen Cardiovascular status: blood pressure returned to baseline and stable Postop Assessment: no apparent nausea or vomiting Anesthetic complications: no   No notable events documented.   Last Vitals:  Vitals:   10/01/24 1215 10/01/24 1246  BP:  (!) 125/58  Pulse: 62 75  Resp: 11 15  Temp: 36.4 C 36.4 C  SpO2: 97% 97%    Last Pain:  Vitals:   10/01/24 1451  TempSrc:   PainSc: 1                  Prentice Murphy

## 2024-10-01 NOTE — Discharge Instructions (Signed)

## 2024-10-01 NOTE — Transfer of Care (Signed)
 Immediate Anesthesia Transfer of Care Note  Patient: [name] [name]  Procedure(s) Performed: ARTHROPLASTY, HIP, TOTAL, ANTERIOR APPROACH (Left: Hip)  Patient Location: PACU  Anesthesia Type:General  Level of Consciousness: drowsy  Airway & Oxygen Therapy: Patient Spontanous Breathing and Patient connected to face mask oxygen  Post-op Assessment: Report given to RN and Post -op Vital signs reviewed and stable  Post vital signs: Reviewed and stable  Last Vitals:  Vitals Value Taken Time  BP 99/59 10/01/24 09:35  Temp 36.2 C 10/01/24 09:35  Pulse 63 10/01/24 09:37  Resp 10 10/01/24 09:37  SpO2 99 % 10/01/24 09:37  Vitals shown include unfiled device data.  Last Pain:  Vitals:   10/01/24 0935  TempSrc: Tympanic  PainSc: 0-No pain         Complications: No notable events documented.

## 2024-10-01 NOTE — Evaluation (Signed)
 Physical Therapy Evaluation Patient Details Name: [name] [name] MRN: [id number] DOB: May 29, 1961 Today's Date: 10/01/2024  History of Present Illness  Patient is a 63 year old male with left hip osteoarthritis s/p left total hip arthroplasty. History of right total hip arthroplasty.  Clinical Impression  Patient is agreeable to PT evaluation. He reports having a rolling walker at home after recent right hip surgery, but has not required DME for ambulation recently. He lives alone but is planning to go to a friend's home at discharge.  Mobility initiated today. Reinforced proper rolling walker use for hallway ambulation. Patient reports minimal pain in the left hip. Recommend to continue PT to maximize independence and decrease caregiver burden.       If plan is discharge home, recommend the following: Assist for transportation;Assistance with cooking/housework   Can travel by private vehicle        Equipment Recommendations None recommended by PT  Recommendations for Other Services       Functional Status Assessment Patient has had a recent decline in their functional status and demonstrates the ability to make significant improvements in function in a reasonable and predictable amount of time.     Precautions / Restrictions Precautions Precautions: Anterior Hip;Fall Precaution Booklet Issued: Yes (comment) Recall of Precautions/Restrictions: Intact Restrictions Weight Bearing Restrictions Per Provider Order: Yes LLE Weight Bearing Per Provider Order: Weight bearing as tolerated      Mobility  Bed Mobility Overal bed mobility: Needs Assistance Bed Mobility: Supine to Sit, Sit to Supine     Supine to sit: Supervision, HOB elevated Sit to supine: Supervision, HOB elevated   General bed mobility comments: increased time    Transfers Overall transfer level: Needs assistance Equipment used: Rolling walker (2 wheels) Transfers: Sit to/from Stand Sit to Stand:  Supervision           General transfer comment: increased time required    Ambulation/Gait Ambulation/Gait assistance: Contact guard assist Gait Distance (Feet): 75 Feet Assistive device: Rolling walker (2 wheels) Gait Pattern/deviations: Step-through pattern, Decreased stride length Gait velocity: decreased     General Gait Details: reinforced proper rolling walker use. CGA provided for safety  Stairs            Wheelchair Mobility     Tilt Bed    Modified Rankin (Stroke Patients Only)       Balance Overall balance assessment: Needs assistance Sitting-balance support: Feet supported Sitting balance-Leahy Scale: Good     Standing balance support: Bilateral upper extremity supported Standing balance-Leahy Scale: Fair Standing balance comment: recommend UE support of rolling walker with dynamic activity for safety                             Pertinent Vitals/Pain Pain Assessment Pain Assessment: 0-10 Pain Score: 2  Pain Location: L hip Pain Descriptors / Indicators: Discomfort Pain Intervention(s): Limited activity within patient's tolerance, Monitored during session, Repositioned    Home Living Family/patient expects to be discharged to:: Private residence Living Arrangements: Alone Available Help at Discharge: Friend(s);Available 24 hours/day Type of Home: House Home Access: Stairs to enter Entrance Stairs-Rails: Left Entrance Stairs-Number of Steps: 4   Home Layout: One level Home Equipment: [employer]ant (2 wheels);Cane - single point Additional Comments: information is for patient's friends home where he will discharge to and stay for a few weeks as needed. he reports no need for 3-in-1 BSC as he did not require one  with last  hospital stay    Prior Function Prior Level of Function : Independent/Modified Independent             Mobility Comments: no DME needed recently ADLs Comments: independent     Extremity/Trunk  Assessment   Upper Extremity Assessment Upper Extremity Assessment: Overall WFL for tasks assessed    Lower Extremity Assessment Lower Extremity Assessment:  (dorsiflexion/plantarflexion 5/5. patient able to activate hip and knee movement bilaterally and weight bear without overt knee buckling)       Communication   Communication Communication: No apparent difficulties    Cognition Arousal: Alert Behavior During Therapy: WFL for tasks assessed/performed   PT - Cognitive impairments: No apparent impairments                         Following commands: Intact       Cueing Cueing Techniques: Verbal cues     General Comments      Exercises     Assessment/Plan    PT Assessment Patient needs continued PT services  PT Problem List Decreased strength;Decreased activity tolerance;Decreased range of motion;Decreased balance;Decreased mobility;Decreased safety awareness       PT Treatment Interventions DME instruction;Gait training;Stair training;Functional mobility training;Therapeutic activities;Balance training;Therapeutic exercise;Neuromuscular re-education;Cognitive remediation;Patient/family education    PT Goals (Current goals can be found in the Care Plan section)  Acute Rehab PT Goals Patient Stated Goal: home tomorrow PT Goal Formulation: With patient Time For Goal Achievement: 10/15/24 Potential to Achieve Goals: Good    Frequency BID     Co-evaluation               AM-PAC PT 6 Clicks Mobility  Outcome Measure Help needed turning from your back to your side while in a flat bed without using bedrails?: None Help needed moving from lying on your back to sitting on the side of a flat bed without using bedrails?: A Little Help needed moving to and from a bed to a chair (including a wheelchair)?: A Little Help needed standing up from a chair using your arms (e.g., wheelchair or bedside chair)?: A Little Help needed to walk in hospital room?: A  Little Help needed climbing 3-5 steps with a railing? : A Little 6 Click Score: 19    End of Session Equipment Utilized During Treatment: Gait belt Activity Tolerance: Patient tolerated treatment well Patient left: in bed;with call bell/phone within reach;with bed alarm set;with SCD's reapplied (ice pack re-applied L hip) Nurse Communication: Mobility status PT Visit Diagnosis: Difficulty in walking, not elsewhere classified (R26.2);Other abnormalities of gait and mobility (R26.89)    Time: 1420-1443 PT Time Calculation (min) (ACUTE ONLY): 23 min   Charges:   PT Evaluation $PT Eval Low Complexity: 1 Low PT Treatments $Gait Training: 8-22 mins PT General Charges $$ ACUTE PT VISIT: 1 Visit         Randine Essex, PT, MPT   Randine LULLA Essex 10/01/2024, 3:06 PM

## 2024-10-01 NOTE — Anesthesia Preprocedure Evaluation (Signed)
 Anesthesia Evaluation  Patient identified by MRN, date of birth, ID band Patient awake    Reviewed: Allergy & Precautions, NPO status , Patient's Chart, lab work & pertinent test results  Airway Mallampati: III  TM Distance: <3 FB Neck ROM: full    Dental  (+) Chipped, Dental Advidsory Given   Pulmonary neg pulmonary ROS, neg shortness of breath, former smoker   Pulmonary exam normal        Cardiovascular Exercise Tolerance: Good hypertension, (-) angina + CAD, + Past MI, + CABG and +CHF  (-) Cardiac Stents Normal cardiovascular exam(-) dysrhythmias + Cardiac Defibrillator + Valvular Problems/Murmurs      Neuro/Psych negative neurological ROS  negative psych ROS   GI/Hepatic Neg liver ROS,GERD  Controlled,,  Endo/Other  negative endocrine ROS    Renal/GU      Musculoskeletal   Abdominal   Peds  Hematology negative hematology ROS (+)   Anesthesia Other Findings Patient has cardiac clearance for this procedure.   Past Medical History: 08/25/2009: AICD (automatic cardioverter/defibrillator) present     Comment:  a.) enrolled in MADIT-RIT trial; s/p [employer] 100 AICD (SN: [phone]) placement 08/25/2009; b.)               pulse generator changed 12/17/2019 --> [employer] EL AICD (SN: [id number]) placed No date: Apical mural thrombus     Comment:  a.) Tx'd to resolution with warfarin No date: B12 deficiency No date: Bilateral carotid artery stenosis No date: Coronary artery disease     Comment:  a.) LHC 05/02/2009: CTO pRCA, oLAD, LCx --> 2v               revascularization 05/05/2009: LIMA-LAD, RIMA-RCA No date: Former smoker No date: GERD (gastroesophageal reflux disease) No date: Heart murmur No date: HFrEF (heart failure with reduced ejection fraction) (HCC) No date: History of mitral valve repair No date: History of tobacco use No date:  Hyperlipidemia No date: Ischemic cardiomyopathy     Comment:  a.) EF as low as 20%; s/p AICD placement 08/25/2009 No date: Long term (current) use of aspirin  No date: Malignant melanoma in situ (HCC) No date: Mitral valve disease     Comment:  a.) s/p MVR (28-mm Sorin 3D MEMO annuloplasty ring);               performed concurrently with cardiac revascularization               05/05/2009 No date: Neuropathy No date: Osteoarthritis, hip, bilateral No date: Postoperative atrial fibrillation (HCC)     Comment:  a.) s/p cardiac revascularization 05/05/2009 --> Tx'd               with amiodarone 05/05/2009: S/P CABG x 2     Comment:  a.) LIMA-LAD, RIMA-RCA 05/05/2009: S/P MVR (mitral valve repair)     Comment:  a.) 28-mm Sorin 3D MEMO annuloplasty ring  Past Surgical History: 08/25/2009: CARDIAC DEFIBRILLATOR PLACEMENT; N/A     Comment:  Procedure: CARDIAC DEFIBRILLATOR PLACEMENT 07/31/2016: COLONOSCOPY WITH PROPOFOL ; N/A     Comment:  Procedure: COLONOSCOPY WITH PROPOFOL ;  Surgeon: Gladis RAYMOND Mariner, MD;  Location: [facility];  Service:  Endoscopy;  Laterality: N/A; 05/05/2009: CORONARY ARTERY BYPASS GRAFT; N/A     Comment:  Procedure: CORONARY ARTERY BYPASS GRAFT; Location: [facility]; Surgon: Dorise Fellers, MD 12/07/2019: ICD GENERATOR CHANGEOUT; N/A     Comment:  Procedure: ICD GENERATOR CHANGEOUT;  Surgeon: Fernande Elspeth BROCKS, MD;  Location: [facility];  Service:               Cardiovascular;  Laterality: N/A; No date: KNEE SURGERY; Bilateral     Comment:  x4 05/02/2009: LEFT HEART CATH AND CORONARY ANGIOGRAPHY; Left     Comment:  Procedure: LEFT HEART CATH AND CORONARY ANGIOGRAPHY;               Location: [facility] 05/05/2009: MITRAL VALVE REPAIR; N/A     Comment:  Procedure: MITRAL VALVE REPAIR; Location: [facility];               Surgon: Dorise Fellers, MD No date: SHOULDER ARTHROSCOPY; Bilateral  BMI    Body  Mass Index: 26.44 kg/m      Reproductive/Obstetrics negative OB ROS                              Anesthesia Physical Anesthesia Plan  ASA: 4  Anesthesia Plan: General   Post-op Pain Management:    Induction: Intravenous  PONV Risk Score and Plan: 2 and Ondansetron , Dexamethasone , Midazolam  and Treatment may vary due to age or medical condition  Airway Management Planned: Oral ETT  Additional Equipment:   Intra-op Plan:   Post-operative Plan: Extubation in OR  Informed Consent: I have reviewed the patients History and Physical, chart, labs and discussed the procedure including the risks, benefits and alternatives for the proposed anesthesia with the patient or authorized representative who has indicated his/her understanding and acceptance.     Dental Advisory Given  Plan Discussed with: Anesthesiologist, CRNA and Surgeon  Anesthesia Plan Comments: (Patient consented for risks of anesthesia including but not limited to:  - adverse reactions to medications - damage to eyes, teeth, lips or other oral mucosa - nerve damage due to positioning  - sore throat or hoarseness - Damage to heart, brain, nerves, lungs, other parts of body or loss of life  Patient voiced understanding and assent.)         Anesthesia Quick Evaluation

## 2024-10-01 NOTE — Interval H&P Note (Signed)
Patient history and physical updated. Consent reviewed including risks, benefits, and alternatives to surgery. Patient agrees with above plan to proceed with left anterior total hip arthroplasty  

## 2024-10-01 NOTE — Plan of Care (Signed)
  Problem: Activity: Goal: Ability to tolerate increased activity will improve Outcome: Progressing   Problem: Pain Management: Goal: Pain level will decrease with appropriate interventions Outcome: Progressing   

## 2024-10-01 NOTE — Discharge Summary (Incomplete)
 Physician Discharge Summary  Patient ID: [name] [name] MRN: [id number] DOB/AGE: 1961-02-23 63 y.o.  Admit date: 10/01/2024 Discharge date: 10/02/2024  Admission Diagnoses:  Primary osteoarthritis of left hip [M16.12] S/P total left hip arthroplasty [S03.357]   Discharge Diagnoses: Patient Active Problem List   Diagnosis Date Noted   S/P total left hip arthroplasty 10/01/2024   S/P total right hip arthroplasty 06/18/2024   Malignant melanoma in situ [facility])    Coronary artery disease involving native coronary artery of native heart without angina pectoris 01/08/2022   S/P CABG x 2 01/08/2022   HFrEF (heart failure with reduced ejection fraction) (HCC) 01/08/2022   ICD (implantable cardioverter-defibrillator) in place 01/08/2022   History of mitral valve repair 01/08/2022   Bilateral carotid artery stenosis 01/08/2022   Hyperlipidemia LDL goal <70 01/08/2022   History of tobacco use 01/08/2022   Occlusion and stenosis of bilateral carotid arteries 10/18/2017   Atherosclerotic heart disease of native coronary artery without angina pectoris 10/18/2017   Impaired fasting glucose 10/18/2017   ICD-[employer]    Left ventricular thrombus 01/09/2011   Long term current use of anticoagulant 01/09/2011   CAD, AUTOLOGOUS BYPASS GRAFT 05/23/2009   Primary cardiomyopathy (HCC) 05/23/2009   ATRIAL FIBRILLATION 05/23/2009   Essential hypertension 04/29/2009   MURMUR 04/29/2009    Past Medical History:  Diagnosis Date   AICD (automatic cardioverter/defibrillator) present 08/25/2009   a.) enrolled in MADIT-RIT trial; s/p [employer] 100 AICD (SN: [id number]) placement 08/25/2009; b.) pulse generator changed 12/17/2019 --> [employer] EL AICD (SN: [id number]) placed   Apical mural thrombus    a.) Tx'd to resolution with warfarin   B12 deficiency    Bilateral carotid artery stenosis    Cancer (HCC) ?8   CHF (congestive heart failure) (HCC) ?   Coronary artery  disease    a.) LHC 05/02/2009: CTO pRCA, oLAD, LCx --> 2v revascularization 05/05/2009: LIMA-LAD, RIMA-RCA   Former smoker    GERD (gastroesophageal reflux disease)    Heart murmur    HFrEF (heart failure with reduced ejection fraction) (HCC)    History of tobacco use    Hyperlipidemia    Hypertension    Ischemic cardiomyopathy    a.) EF as low as 20%; s/p AICD placement 08/25/2009   Long term (current) use of aspirin     Malignant melanoma in situ (HCC)    Mitral valve disease    a.) s/p MVR (28-mm Sorin 3D MEMO annuloplasty ring); performed concurrently with cardiac revascularization 05/05/2009   Myocardial infarction [facility])    a.) details unclear; pat thinks it was prior to 2010.   Neuropathy    Osteoarthritis    Osteoarthritis, hip, bilateral    Postoperative atrial fibrillation (HCC)    a.) s/p cardiac revascularization 05/05/2009 --> Tx'd with amiodarone   S/P CABG x 2 05/05/2009   a.) LIMA-LAD, RIMA-RCA   S/P MVR (mitral valve repair) 05/05/2009   a.) 28-mm Sorin 3D MEMO annuloplasty ring     Transfusion: none   Consultants (if any):   Discharged Condition: Improved  Hospital Course: HILL MACKIE is an 63 y.o. male who was admitted 10/01/2024 with a diagnosis of S/P total left hip arthroplasty and went to the operating room on 10/01/2024 and underwent the above named procedures.    Surgeries: Procedures: ARTHROPLASTY, HIP, TOTAL, ANTERIOR APPROACH on 10/01/2024 Patient tolerated the surgery well. Taken to PACU where she was stabilized and then transferred to the orthopedic floor.  Started on Lovenox  40mg  q  24 hrs. TEDs and SCDs applied bilaterally. Heels elevated on bed. No evidence of DVT. Negative Homan. Physical therapy started on day #1 for gait training and transfer. OT started day #1 for ADL and assisted devices.  Patient's IV was d/c on day #1. Patient was able to safely and independently complete all PT goals. PT recommending discharge to home.    On post  op day #1 patient was stable and ready for discharge to home with HHPT.  Implants: Cup: Trident Tritanium Clusterhole 52/E w/x2 screws    Liner: Neutral X3 Poly 36/E  Stem: Insignia #6 high offset  Head:Biolox ceramic 36 +0    He was given perioperative antibiotics:  Anti-infectives (From admission, onward)    Start     Dose/Rate Route Frequency Ordered Stop   10/01/24 1330  ceFAZolin  (ANCEF ) IVPB 2g/100 mL premix        2 g 200 mL/hr over 30 Minutes Intravenous Every 6 hours 10/01/24 1058 10/02/24 0129   10/01/24 0600  ceFAZolin  (ANCEF ) IVPB 2g/100 mL premix        2 g 200 mL/hr over 30 Minutes Intravenous On call to O.R. 09/30/24 2244 10/01/24 0744     .  He was given sequential compression devices, early ambulation, and Lovenox  TEDs for DVT prophylaxis.  He benefited maximally from the hospital stay and there were no complications.    Recent vital signs:  Vitals:   10/01/24 1215 10/01/24 1246  BP:  (!) 125/58  Pulse: 62 75  Resp: 11 15  Temp: 97.6 F (36.4 C) 97.6 F (36.4 C)  SpO2: 97% 97%    Recent laboratory studies:  Lab Results  Component Value Date   HGB 14.0 09/08/2024   HGB 11.3 (L) 06/19/2024   HGB 14.1 06/08/2024   Lab Results  Component Value Date   WBC 8.7 09/08/2024   PLT 218 09/08/2024   Lab Results  Component Value Date   INR 2.6 04/29/2013   Lab Results  Component Value Date   NA 137 09/08/2024   K 4.3 09/08/2024   CL 103 09/08/2024   CO2 26 09/08/2024   BUN 14 09/08/2024   CREATININE 0.88 09/08/2024   GLUCOSE 95 09/08/2024    Discharge Medications:   Allergies as of 10/01/2024   No Known Allergies      Medication List     STOP taking these medications    FISH OIL PO       TAKE these medications    acetaminophen  500 MG tablet Commonly known as: TYLENOL  Take 2 tablets (1,000 mg total) by mouth every 8 (eight) hours.   aspirin  EC 81 MG tablet Take 1 tablet (81 mg total) by mouth daily.   atorvastatin  80 MG  tablet Commonly known as: LIPITOR  Take 1 tablet (80 mg total) by mouth daily.   carvedilol  25 MG tablet Commonly known as: COREG  TAKE 1 TABLET TWICE A DAY WITH MEALS   celecoxib  200 MG capsule Commonly known as: CeleBREX  Take 1 capsule (200 mg total) by mouth 2 (two) times daily for 14 days.   docusate sodium  100 MG capsule Commonly known as: COLACE Take 1 capsule (100 mg total) by mouth 2 (two) times daily.   empagliflozin  10 MG Tabs tablet Commonly known as: Jardiance  Take 1 tablet (10 mg total) by mouth daily.   enoxaparin  40 MG/0.4ML injection Commonly known as: LOVENOX  Inject 0.4 mLs (40 mg total) into the skin daily. Start taking on: October 02, 2024   Entresto  24-26  MG Generic drug: sacubitril -valsartan  Take 1 tablet by mouth 2 (two) times daily.   ezetimibe  10 MG tablet Commonly known as: ZETIA  Take 1 tablet (10 mg total) by mouth daily.   nitroGLYCERIN  0.4 MG SL tablet Commonly known as: NITROSTAT  Place 1 tablet (0.4 mg total) under the tongue every 5 (five) minutes as needed.   ondansetron  4 MG tablet Commonly known as: ZOFRAN  Take 1 tablet (4 mg total) by mouth every 6 (six) hours as needed for nausea.   oxyCODONE  5 MG immediate release tablet Commonly known as: Roxicodone  Take 0.5-1 tablets (2.5-5 mg total) by mouth every 8 (eight) hours as needed for breakthrough pain.   spironolactone  25 MG tablet Commonly known as: ALDACTONE  TAKE 1 TABLET EVERY DAY   traMADol  50 MG tablet Commonly known as: ULTRAM  Take 1 tablet (50 mg total) by mouth every 6 (six) hours as needed for moderate pain (pain score 4-6).               Durable Medical Equipment  (From admission, onward)           Start     Ordered   10/01/24 1224  For home use only DME 3 n 1  Once        10/01/24 1223   10/01/24 1223  For home use only DME Walker rolling  Once       Question Answer Comment  Walker: With 5 Inch Wheels   Patient needs a walker to treat with the  following condition History of total hip replacement, left      10/01/24 1223            Diagnostic Studies: DG C-Arm 1-60 Min-No Report Result Date: 10/01/2024 Fluoroscopy was utilized by the requesting physician.  No radiographic interpretation.   DG C-Arm 1-60 Min-No Report Result Date: 10/01/2024 Fluoroscopy was utilized by the requesting physician.  No radiographic interpretation.   CUP PACEART REMOTE DEVICE CHECK Result Date: 09/09/2024 ICD Scheduled remote reviewed. Normal device function.  Presenting rhythm: AS-VS.  Recent significant elevations in RV impedance, previously routed to triage. Next remote transmission per protocol. - CS, CVRS   Disposition:      Follow-up Information     Charlene Debby BROCKS, PA-C Follow up in 2 week(s).   Specialties: Orthopedic Surgery, Emergency Medicine Contact information: [address] [place] [address] [phone]                  Signed: Debby BROCKS Charlene 10/01/2024, 2:42 PM

## 2024-10-01 NOTE — Progress Notes (Signed)
 Patient is not able to walk the distance required to go the bathroom, or he is unable to safely negotiate stairs required to access the bathroom.  A 3in1 BSC will alleviate this problem.        Lollie Marrow, PA-C [facility]

## 2024-10-01 NOTE — Progress Notes (Signed)
 Patient has mobility impairment for daily activities. A rolling walker will resolve this and the patient is safe to use it    T. Medford Amber, PA-C [facility]

## 2024-10-01 NOTE — Op Note (Signed)
 Patient Name: [name] [name]  MRN: [id number]  Pre-Operative Diagnosis: Left hip Osteoarthritis  Post-Operative Diagnosis: (same)  Procedure: Left Total Hip Arthroplasty  Components/Implants: Cup: Trident Tritanium Clusterhole 52/E w/x2 screws    Liner: Neutral X3 Poly 36/E  Stem: Insignia #6 high offset  Head:Biolox ceramic 36 +0  Date of Surgery: 10/01/2024  Surgeon: Arthea Sheer MD  Assistant: Debby Amber PA (present and scrubbed throughout the case, critical for assistance with exposure, retraction, instrumentation, and closure)   Anesthesiologist: Dario  Anesthesia: Spinal   EBL: 150cc  IVF:800cc  Complications: None   Brief history: The patient is a 63 year old male with a history of osteoarthritis of the left hip with pain limiting their range of motion and activities of daily living, which has failed multiple attempts at conservative therapy.  The risks and benefits of total hip arthroplasty as definitive surgical treatment were discussed with the patient, who opted to proceed with the operation.  After outpatient medical clearance and optimization was completed the patient was admitted to [facility] for the procedure.  All preoperative films were reviewed and an appropriate surgical plan was made prior to surgery.   Description of procedure: The patient was brought to the operating room where laterality was confirmed by all those present to be the left side.  The patient was administered spinal anesthesia on a stretcher prior to being moved supine on the operating room table. Patient was given an intravenous dose of antibiotics for surgical prophylaxis and TXA.  All bony prominences and extremities were well padded and the patient was securely attached to the table boots, a perineal post was placed and the patient had a safety strap placed.  Surgical site was prepped with alcohol and chlorhexidine . The surgical site over the hip was and draped in  typical sterile fashion with multiple layers of adhesive and nonadhesive drapes.  The incision site was marked out with a sterile marker and care was taken to assess the position of the ASIS and ensure appropriate position for the incision.    A surgical timeout was then called with participation of all staff in the room the patient was then a confirmed again and laterality confirmed.  Incision was made over the anterior lateral aspect of the proximal thigh in line with the TFL.  Appropriate retractors were placed and all bleeding vessels were coagulated within the subcutaneous and fatty layers.  An incision was made in the TFL fascia in the interval was carefully identified.  The lateral ascending branches of the circumflex vessels were identified, cauterized and carefully dissected. The main vessels were then tied with a 0 silk hand tie.  Retractors were placed around the superior lateral and inferior medial aspects of the femoral neck and a capsulotomy was performed exposing the hip joint.  Retraction stitches were placed and the capsulotomy to assist with visualization.  The femoral head and anterior acetabulum were examined  at this time and shown to have full thickness cartilage damage with severe arthritic changes, the decision was made to proceed with total hip arthroplasty. Femoral neck cut was then made and the femoral head was extracted after placing the leg in traction.  Bone wax was then applied to the proximal cut surface of the femur and water cooled bipolar electrocautery was used to address any bleeding around the femoral neck cut.  Retractors were then placed around the acetabulum to fully visualize the joint space, and the remaining labral tissue was removed and pulvinar was removed.  The acetabulum was then sequentially reamed up to the appropriate size in order to get good fit and fill for the acetabular component while under fluoroscopic guidance.  Acetabular component was then placed and  malleted into a secure fit while confirming position and abduction angle and anteversion utilizing fluoroscopy.  2 screws were then placed in the acetabular cup to assist in securing the cup in place. The cup was irrigated,  a real neutral liner was placed, impacted, and checked for stability. The femur traction was dropped and sequentially externally rotated while performing a release of the posterior and superomedial tissues off of the proximal femur to allow for mobility, care was taken to preserve the external rotators and piriformis attachments.  The remaining interval between the abductors and the capsule was dissected out and a retractor was placed over the superolateral aspect of the femur over the greater trochanter.  The leg was carefully brought down into extension and adducted to provide visualization of the proximal femur for broaching.  The femur was then sequentially broached up to an appropriate size which provided for good fill and stability to the femoral broach.  A trial neck and head were placed on the femoral broach and the leg was brought up for reduction.  The hip was reduced and manual check of stability was performed.  The hip was found to be stable in flexion internal rotation and extension external rotation.  Leg lengths were confirmed on fluoroscopy.   The hip was then dislocated the trial neck and head were removed.  The leg was then brought down into extension and adduction in the proximal femur was reexposed.  The broach trial was removed and the femur was irrigated with normal saline prior to the real femoral stem being implanted.  After the femoral stem was seated and shown to have good fit and fill the appropriate head was impacted the leg was brought up and reduced.  There was good range of motion with stability in flexion internal rotation and extension external rotation on testing.  Leg lengths were found to be appropriate on fluoroscopic evaluation at this time.  The hip was  then irrigated with betdine based surgiphor solution and then saline solution.  The capsulotomy was repaired with Ethibond sutures.  A pericapsular and peritrochanteric cocktail with Exparel  and bupivacaine  was then injected as well as the subcutaneous tissues. The fascia was closed with a #1 barbed running suture.  The deep tissues were closed with Vicryl sutures the subcutaneous tissues were closed with interrupted Vicryl sutures and a running barbed 4-0 suture.  The skin was then reinforced with Dermabond and a sterile dressing was placed.   The patient was awoken from anesthesia transferred off of the operating room table onto a hospital bed where examination of leg lengths found the leg lengths to be equal with a good distal pulse.  The patient was then transferred to the PACU in stable condition.

## 2024-10-02 ENCOUNTER — Encounter: Payer: Self-pay | Admitting: Orthopedic Surgery

## 2024-10-02 LAB — BASIC METABOLIC PANEL WITH GFR
Anion gap: 8 (ref 5–15)
BUN: 18 mg/dL (ref 8–23)
CO2: 22 mmol/L (ref 22–32)
Calcium: 8.9 mg/dL (ref 8.9–10.3)
Chloride: 106 mmol/L (ref 98–111)
Creatinine, Ser: 0.82 mg/dL (ref 0.61–1.24)
GFR, Estimated: >60 mL/min (ref >60)
Glucose, Bld: 123 mg/dL — ABNORMAL HIGH (ref 70–99)
Potassium: 4.5 mmol/L (ref 3.5–5.1)
Sodium: 136 mmol/L (ref 135–145)

## 2024-10-02 LAB — CBC
HCT: 35.3 % — ABNORMAL LOW (ref 39.0–52.0)
Hemoglobin: 11.8 g/dL — ABNORMAL LOW (ref 13.0–17.0)
MCH: 30.3 pg (ref 26.0–34.0)
MCHC: 33.4 g/dL (ref 30.0–36.0)
MCV: 90.7 fL (ref 80.0–100.0)
Platelets: 179 K/uL (ref 150–400)
RBC: 3.89 MIL/uL — ABNORMAL LOW (ref 4.22–5.81)
RDW: 14.2 % (ref 11.5–15.5)
WBC: 15.3 K/uL — ABNORMAL HIGH (ref 4.0–10.5)
nRBC: 0 % (ref 0.0–0.2)

## 2024-10-02 MED ADMIN — Sacubitril-Valsartan Tab 24-26 MG: 1 | ORAL | NDC 72205028018

## 2024-10-02 NOTE — Progress Notes (Signed)
 Discharged order received. Surgical incision clean dry and intact.Discharge isntructions given to patient and he verbalized understanding.

## 2024-10-02 NOTE — Plan of Care (Signed)
 Discharged order received. Vital signs within normal limits.Discharged instructions given. Patient verbalized understanding. Meds delivered at bedside. No signs of acute distress.

## 2024-10-02 NOTE — Plan of Care (Signed)
 See chart

## 2024-10-02 NOTE — Progress Notes (Signed)
 Subjective: 1 Day Post-Op Procedures (LRB): ARTHROPLASTY, HIP, TOTAL, ANTERIOR APPROACH (Left) Patient reports pain as mild.   Patient is well, and has had no acute complaints or problems Denies any CP, SOB, ABD pain. We will continue therapy today.  Plan is to go Home after hospital stay.  Objective: Vital signs in last 24 hours: Temp:  [97.1 F (36.2 C)-98.3 F (36.8 C)] 97.8 F (36.6 C) (12/12 0455) Pulse Rate:  [57-89] 70 (12/12 0455) Resp:  [9-17] 16 (12/12 0455) BP: (99-125)/(58-77) 101/71 (12/12 0455) SpO2:  [94 %-100 %] 97 % (12/12 0455)  Intake/Output from previous day: 12/11 0701 - 12/12 0700 In: 1972.9 [P.O.:125; I.V.:1347.9; IV Piggyback:500] Out: 925 [Urine:775; Blood:150] Intake/Output this shift: No intake/output data recorded.  Recent Labs    10/02/24 0657  HGB 11.8*   Recent Labs    10/02/24 0657  WBC 15.3*  RBC 3.89*  HCT 35.3*  PLT 179   Recent Labs    10/02/24 0657  NA 136  K 4.5  CL 106  CO2 22  BUN 18  CREATININE 0.82  GLUCOSE 123*  CALCIUM  8.9   No results for input(s): LABPT, INR in the last 72 hours.  EXAM General - Patient is Alert, Appropriate, and Oriented Extremity - Neurovascular intact Sensation intact distally Intact pulses distally Dorsiflexion/Plantar flexion intact Dressing - dressing C/D/I and no drainage Motor Function - intact, moving foot and toes well on exam.   Past Medical History:  Diagnosis Date   AICD (automatic cardioverter/defibrillator) present 08/25/2009   a.) enrolled in MADIT-RIT trial; s/p [employer] 100 AICD (SN: [phone] [id number]) placement 08/25/2009; b.) pulse generator changed 12/17/2019 --> [employer] EL AICD (SN: [id number]) placed   Apical mural thrombus    a.) Tx'd to resolution with warfarin   B12 deficiency    Bilateral carotid artery stenosis    Cancer (HCC) ?8   CHF (congestive heart failure) (HCC) ?   Coronary artery disease    a.) LHC 05/02/2009: CTO  pRCA, oLAD, LCx --> 2v revascularization 05/05/2009: LIMA-LAD, RIMA-RCA   Former smoker    GERD (gastroesophageal reflux disease)    Heart murmur    HFrEF (heart failure with reduced ejection fraction) (HCC)    History of tobacco use    Hyperlipidemia    Hypertension    Ischemic cardiomyopathy    a.) EF as low as 20%; s/p AICD placement 08/25/2009   Long term (current) use of aspirin     Malignant melanoma in situ (HCC)    Mitral valve disease    a.) s/p MVR (28-mm Sorin 3D MEMO annuloplasty ring); performed concurrently with cardiac revascularization 05/05/2009   Myocardial infarction [facility])    a.) details unclear; pat thinks it was prior to 2010.   Neuropathy    Osteoarthritis    Osteoarthritis, hip, bilateral    Postoperative atrial fibrillation (HCC)    a.) s/p cardiac revascularization 05/05/2009 --> Tx'd with amiodarone   S/P CABG x 2 05/05/2009   a.) LIMA-LAD, RIMA-RCA   S/P MVR (mitral valve repair) 05/05/2009   a.) 28-mm Sorin 3D MEMO annuloplasty ring    Assessment/Plan:   1 Day Post-Op Procedures (LRB): ARTHROPLASTY, HIP, TOTAL, ANTERIOR APPROACH (Left) Principal Problem:   S/P total left hip arthroplasty  Estimated body mass index is 25.54 kg/m as calculated from the following:   Height as of this encounter: 5' 10 (1.778 m).   Weight as of this encounter: 80.7 kg. Advance diet Up with therapy Pain well-controlled Labs  and VSS CM to assist with discharge to home with HHPT today  DVT Prophylaxis - Lovenox , TED hose, and SCDs Weight-Bearing as tolerated to left leg   T. Medford Amber, PA-C [facility] 10/02/2024, 8:00 AM

## 2024-10-02 NOTE — Progress Notes (Signed)
 Physical Therapy Treatment Patient Details Name: [name] [name] MRN: [id number] DOB: 1961-03-24 Today's Date: 10/02/2024   History of Present Illness Patient is a 63 year old male with left hip osteoarthritis s/p left total hip arthroplasty. History of right total hip arthroplasty.    PT Comments  Pt was supine (HOB elevated ~ 15 degrees) upon arrival. He is A and O x 4. Endorses very minimal pain (sore only). Rated 2/10. Pt easily and safely demonstrated abilities to exit bed, stand to RW, and tolerate ambulation ~ 200 ft. No LOB. Safely demonstrated abilities to ascend/descend 4 stairs to simulate home entry. Pt is cleared from an acute PT standpoint for safe DC home once cleared medically. DC recs remain appropriate.    If plan is discharge home, recommend the following: Assist for transportation;Assistance with cooking/housework     Equipment Recommendations  None recommended by PT       Precautions / Restrictions Precautions Precautions: Anterior Hip;Fall Precaution Booklet Issued: Yes (comment) Recall of Precautions/Restrictions: Intact Restrictions Weight Bearing Restrictions Per Provider Order: Yes LLE Weight Bearing Per Provider Order: Weight bearing as tolerated     Mobility  Bed Mobility Overal bed mobility: Needs Assistance Bed Mobility: Supine to Sit, Sit to Supine  Supine to sit: Supervision, HOB elevated Sit to supine: Supervision, HOB elevated   Transfers Overall transfer level: Needs assistance Equipment used: Rolling walker (2 wheels) Transfers: Sit to/from Stand Sit to Stand: Supervision  General transfer comment: no physical assistance or safety concerns    Ambulation/Gait Ambulation/Gait assistance: Supervision Gait Distance (Feet): 200 Feet Assistive device: Rolling walker (2 wheels) Gait Pattern/deviations: Step-through pattern Gait velocity: decreased  General Gait Details: pt demonstrated safe steady gait kinematics without LOB or safety  concerns   Stairs Stairs: Yes Stairs assistance: Supervision Stair Management: Two rails, Step to pattern, Forwards Number of Stairs: 4 General stair comments: Pt demonstrated safe abilities to ascend/descend stairs to simulate home entry    Balance Overall balance assessment: Needs assistance Sitting-balance support: Feet supported Sitting balance-Leahy Scale: Good     Standing balance support: Bilateral upper extremity supported, During functional activity, Reliant on assistive device for balance Standing balance-Leahy Scale: Good       Communication Communication Communication: No apparent difficulties  Cognition Arousal: Alert Behavior During Therapy: WFL for tasks assessed/performed   PT - Cognitive impairments: No apparent impairments      PT - Cognition Comments: Pt is A and O x 4. Cooperative and motivated Following commands: Intact      Cueing Cueing Techniques: Verbal cues     General Comments General comments (skin integrity, edema, etc.): Issued HEP. Pt is easy able to I'ly perform without assistance or safety concerns      Pertinent Vitals/Pain Pain Assessment Pain Assessment: 0-10 Pain Score: 2  Pain Location: L hip Pain Descriptors / Indicators: Sore Pain Intervention(s): Limited activity within patient's tolerance, Monitored during session, Repositioned     PT Goals (current goals can now be found in the care plan section) Acute Rehab PT Goals Patient Stated Goal: go home (to friends house) Progress towards PT goals: Progressing toward goals    Frequency    BID       AM-PAC PT 6 Clicks Mobility   Outcome Measure  Help needed turning from your back to your side while in a flat bed without using bedrails?: None Help needed moving from lying on your back to sitting on the side of a flat bed without using bedrails?: None Help  needed moving to and from a bed to a chair (including a wheelchair)?: None Help needed standing up from a chair  using your arms (e.g., wheelchair or bedside chair)?: A Little Help needed to walk in hospital room?: A Little Help needed climbing 3-5 steps with a railing? : A Little 6 Click Score: 21    End of Session   Activity Tolerance: Patient tolerated treatment well Patient left: in chair;with call bell/phone within reach;with chair alarm set Nurse Communication: Mobility status PT Visit Diagnosis: Difficulty in walking, not elsewhere classified (R26.2);Other abnormalities of gait and mobility (R26.89)     Time: 9268-9256 PT Time Calculation (min) (ACUTE ONLY): 12 min  Charges:    $Gait Training: 8-22 mins PT General Charges $$ ACUTE PT VISIT: 1 Visit                      Rankin Essex PTA 10/02/2024, 7:49 AM

## 2024-10-02 NOTE — TOC Transition Note (Signed)
 Transition of Care [facility]) - Discharge Note   Patient Details  Name: [name] [name] MRN: [id number] Date of Birth: 1960-11-01  [facility] CM/SW Contact:  Norwin Aleman L Shakirra Buehler, LCSW Phone Number: 10/02/2024, 9:17 AM   Clinical Narrative:     Home Health services were pre-arranged with [facility] by the surgeon's office. DME not recommended.   TOC signing off.         Patient Goals and CMS Choice            Discharge Placement                       Discharge Plan and Services Additional resources added to the After Visit Summary for                                       Social Drivers of Health (SDOH) Interventions SDOH Screenings   Food Insecurity: No Food Insecurity (10/01/2024)  Housing: Low Risk (10/01/2024)  Transportation Needs: No Transportation Needs (10/01/2024)  Utilities: Not At Risk (10/01/2024)  Alcohol Screen: Low Risk (03/11/2024)  Depression (PHQ2-9): Low Risk (09/30/2024)  Financial Resource Strain: Low Risk  (09/23/2024)   Received from [facility]  Physical Activity: Sufficiently Active (09/30/2024)  Social Connections: Unknown (10/01/2024)  Stress: No Stress Concern Present (09/30/2024)  Tobacco Use: Medium Risk (10/01/2024)  Health Literacy: Adequate Health Literacy (09/30/2024)     Readmission Risk Interventions     No data to display

## 2024-12-01 ENCOUNTER — Ambulatory Visit: Payer: Medicare HMO

## 2024-12-08 ENCOUNTER — Ambulatory Visit

## 2024-12-18 ENCOUNTER — Ambulatory Visit: Admitting: Physician Assistant

## 2025-02-09 ENCOUNTER — Ambulatory Visit: Admitting: Physician Assistant

## 2025-03-09 ENCOUNTER — Ambulatory Visit

## 2025-05-10 ENCOUNTER — Ambulatory Visit: Admitting: Physician Assistant

## 2025-06-08 ENCOUNTER — Ambulatory Visit

## 2025-09-07 ENCOUNTER — Ambulatory Visit
